# Patient Record
Sex: Male | Born: 1945 | Race: White | Hispanic: No | Marital: Married | Smoking: Former smoker
Health system: Southern US, Community
[De-identification: ages and names within clinical notes are randomized; demographics above are authoritative.]

## PROBLEM LIST (undated history)

## (undated) DIAGNOSIS — K759 Inflammatory liver disease, unspecified: Secondary | ICD-10-CM

## (undated) DIAGNOSIS — O2230 Deep phlebothrombosis in pregnancy, unspecified trimester: Secondary | ICD-10-CM

## (undated) DIAGNOSIS — M4639 Infection of intervertebral disc (pyogenic), multiple sites in spine: Secondary | ICD-10-CM

## (undated) DIAGNOSIS — I10 Essential (primary) hypertension: Secondary | ICD-10-CM

## (undated) DIAGNOSIS — R0600 Dyspnea, unspecified: Secondary | ICD-10-CM

## (undated) DIAGNOSIS — K219 Gastro-esophageal reflux disease without esophagitis: Secondary | ICD-10-CM

## (undated) DIAGNOSIS — I509 Heart failure, unspecified: Secondary | ICD-10-CM

## (undated) DIAGNOSIS — I499 Cardiac arrhythmia, unspecified: Secondary | ICD-10-CM

## (undated) DIAGNOSIS — K859 Acute pancreatitis without necrosis or infection, unspecified: Secondary | ICD-10-CM

## (undated) DIAGNOSIS — K861 Other chronic pancreatitis: Secondary | ICD-10-CM

## (undated) DIAGNOSIS — R3989 Other symptoms and signs involving the genitourinary system: Secondary | ICD-10-CM

## (undated) DIAGNOSIS — J449 Chronic obstructive pulmonary disease, unspecified: Secondary | ICD-10-CM

## (undated) HISTORY — PX: REPLACEMENT TOTAL KNEE: SUR1224

## (undated) HISTORY — PX: ESOPHAGOGASTRODUODENOSCOPY: SHX1529

## (undated) HISTORY — DX: Essential (primary) hypertension: I10

## (undated) HISTORY — DX: Cardiac arrhythmia, unspecified: I49.9

---

## 2010-10-22 ENCOUNTER — Ambulatory Visit: Payer: Self-pay | Admitting: Oncology

## 2010-11-09 ENCOUNTER — Inpatient Hospital Stay: Payer: Self-pay | Admitting: *Deleted

## 2010-11-11 LAB — CANCER ANTIGEN 19-9: CA 19-9: 16 U/mL (ref 0–35)

## 2010-11-11 LAB — AFP TUMOR MARKER: AFP-Tumor Marker: 1.7 ng/mL (ref 0.0–8.3)

## 2010-11-11 LAB — CEA: CEA: 1.2 ng/mL (ref 0.0–4.7)

## 2010-11-20 ENCOUNTER — Ambulatory Visit: Payer: Self-pay | Admitting: Oncology

## 2010-11-25 ENCOUNTER — Ambulatory Visit: Payer: Self-pay | Admitting: Internal Medicine

## 2010-12-02 ENCOUNTER — Ambulatory Visit: Payer: Self-pay | Admitting: Internal Medicine

## 2010-12-02 ENCOUNTER — Inpatient Hospital Stay: Payer: Self-pay | Admitting: Specialist

## 2011-07-16 IMAGING — CT CT GUIDED ABCESS DRAINAGE WITH CATHETER
2 of 3 series · 15 of 32 positions shown, 21 images · non-contrast
Comparison: none

REASON FOR EXAM: Discussed with Dr. More.  For CT guided drainage of
residual left pleural flui
COMMENTS:

PROCEDURE:     CT  - CT GUIDED ABSCESS DRAINAGE  - December 09, 2010 [DATE]
RESULT:
HISTORY: Empyema.

[Series 2: soft tissue · axial · 0.82mm/px · z∈[+295,+565]mm · 11 of 66 slices shown, 17 images]
[im 6/66  soft-tissue]
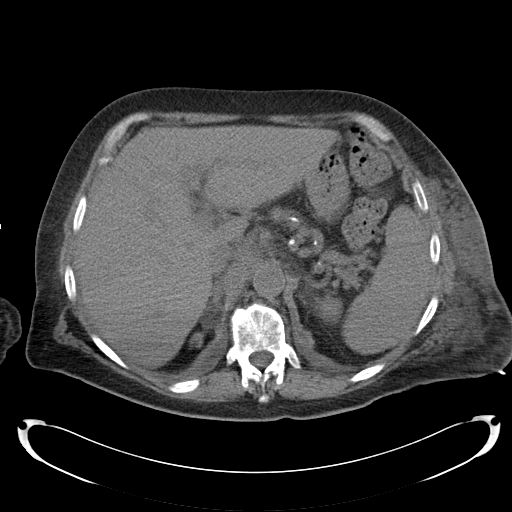
[im 6/66  bone]
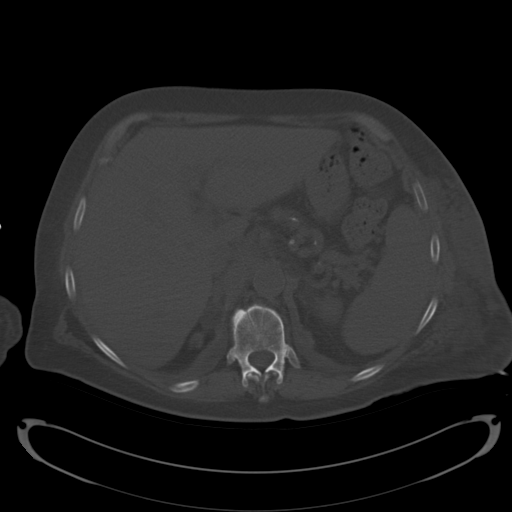
[im 11/66  soft-tissue]
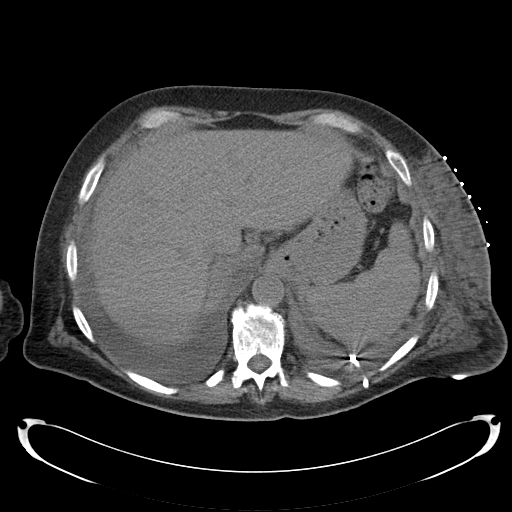
[im 17/66  soft-tissue]
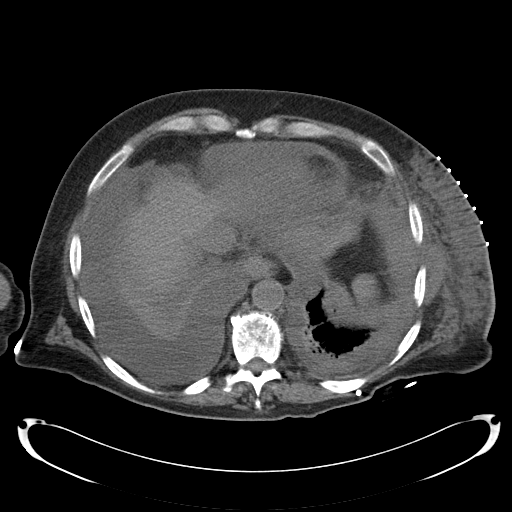
[im 22/66  soft-tissue]
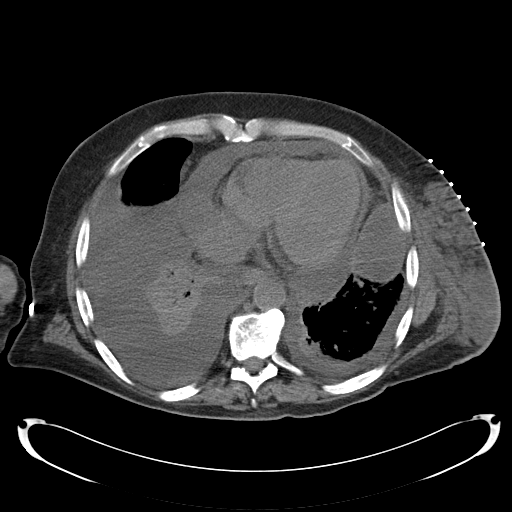
[im 28/66  soft-tissue]
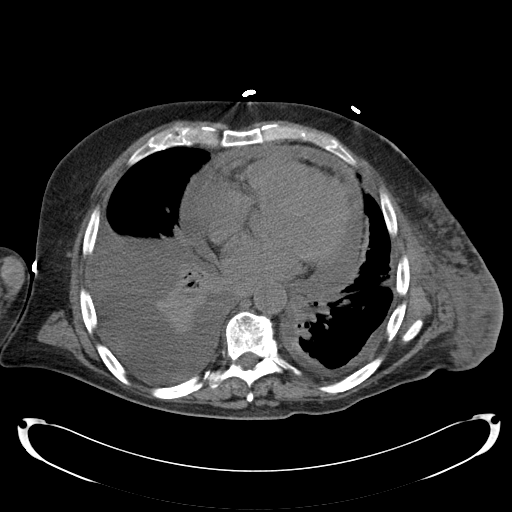
[im 33/66  soft-tissue]
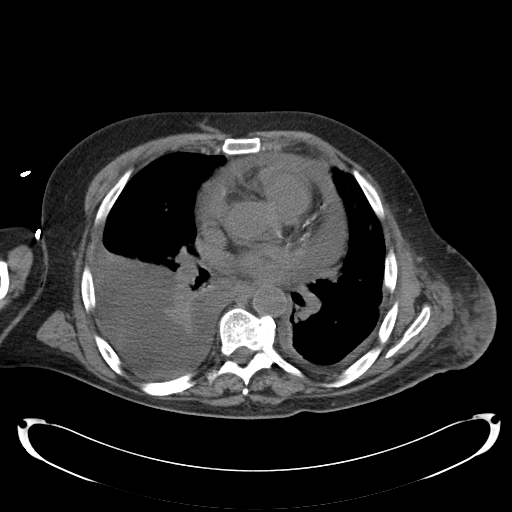
[im 38/66  soft-tissue]
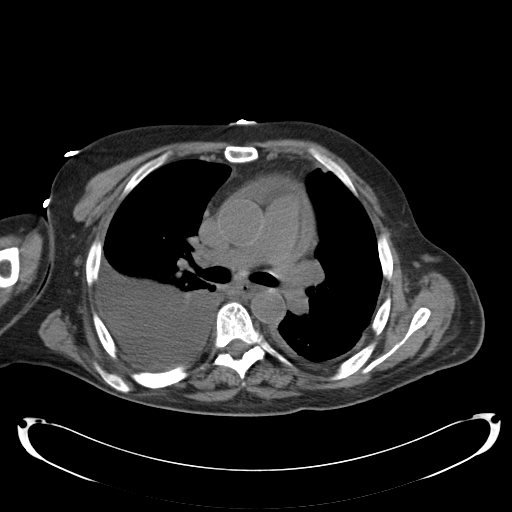
[im 44/66  soft-tissue]
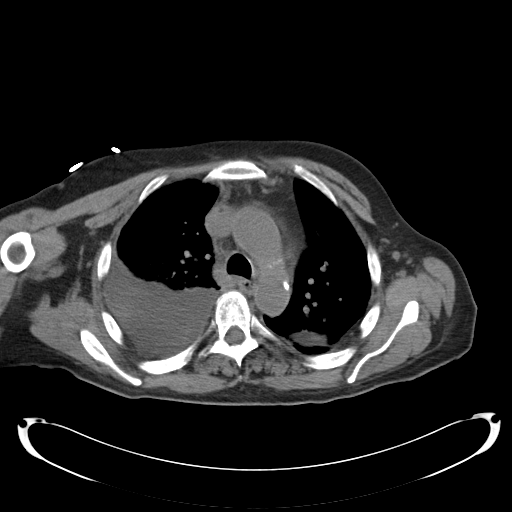
[im 44/66  lung]
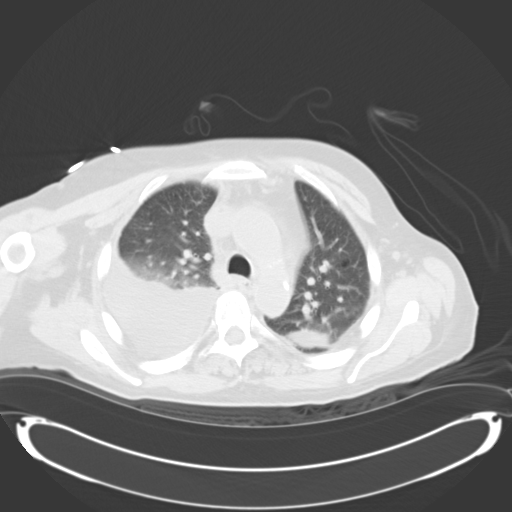
[im 49/66  soft-tissue]
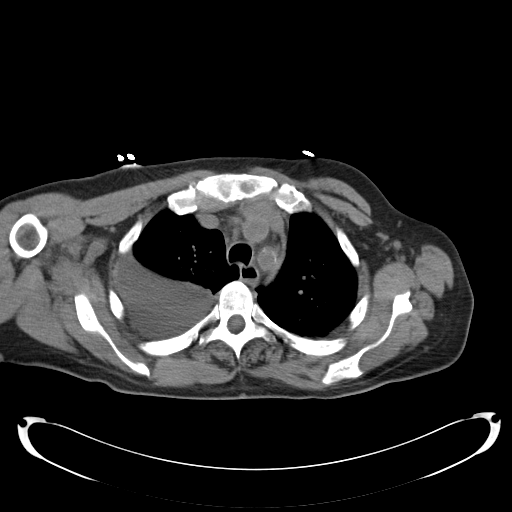
[im 49/66  lung]
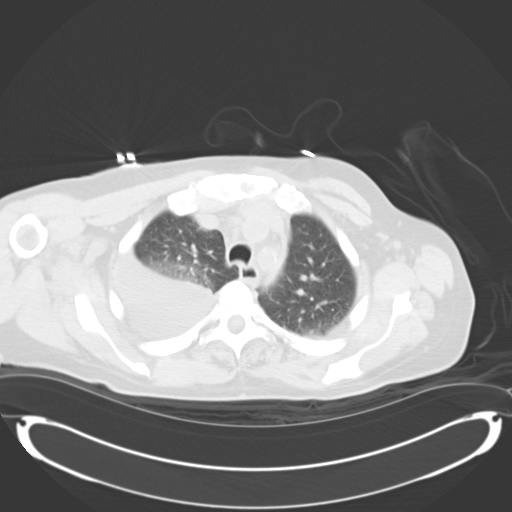
[im 49/66  bone]
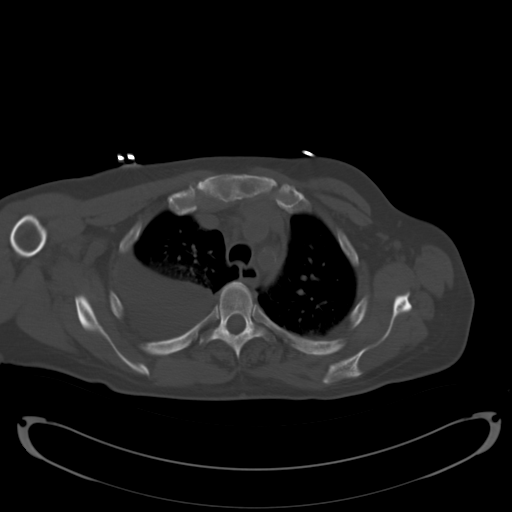
[im 55/66  soft-tissue]
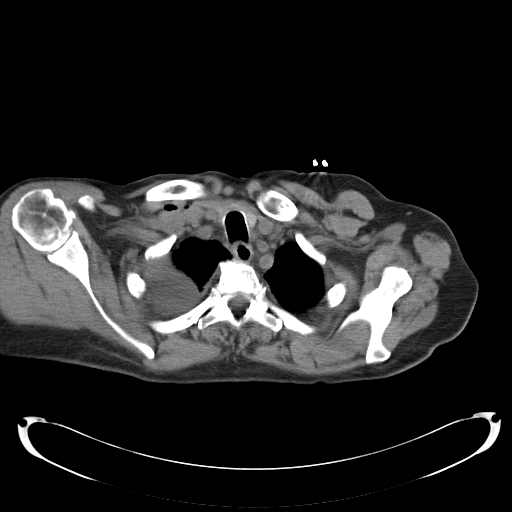
[im 55/66  lung]
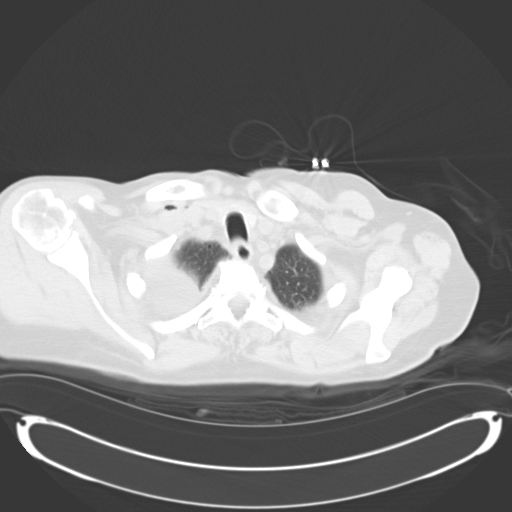
[im 60/66  soft-tissue]
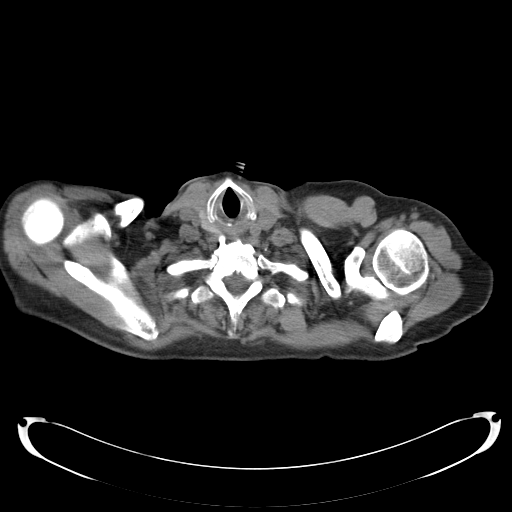
[im 60/66  lung]
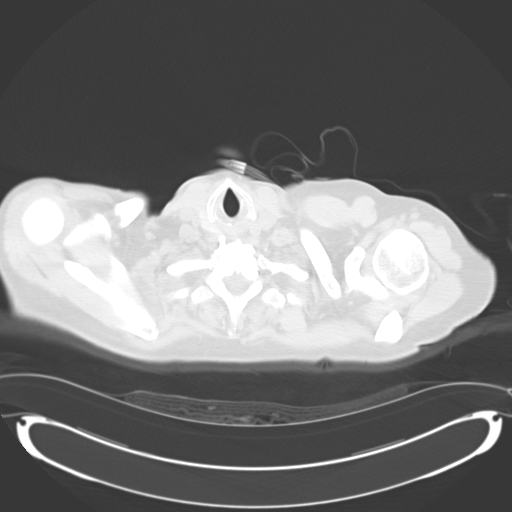

[Series 11: (hospital) 4.8 b30s · axial · 1.48mm/px · z∈[-714,-708]mm · 4 of 33 slices shown]
[im 7/33  soft-tissue]
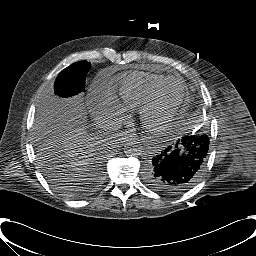
[im 13/33  soft-tissue]
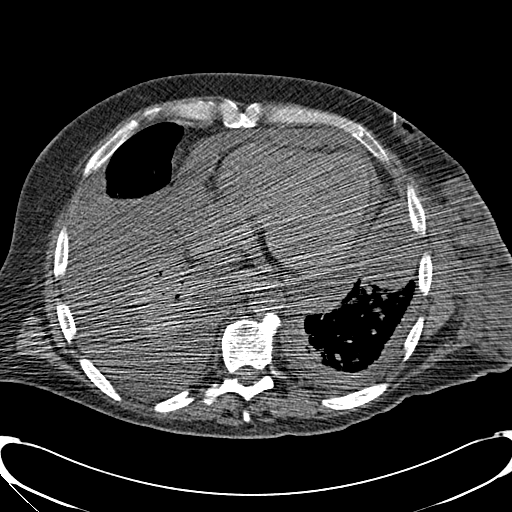
[im 20/33  soft-tissue]
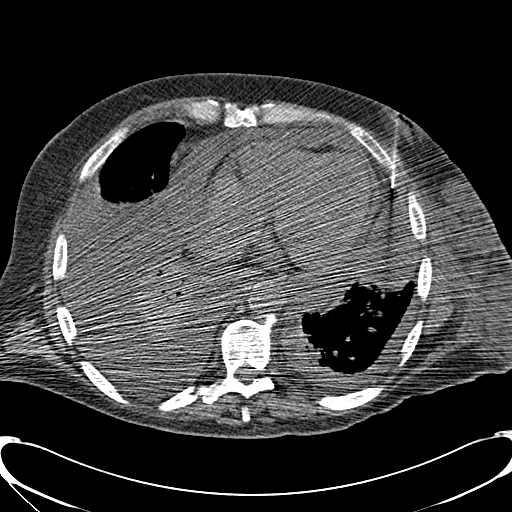
[im 26/33  soft-tissue]
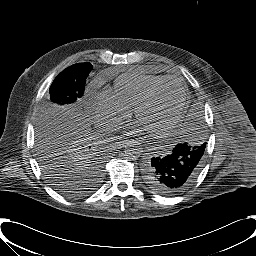

[15 of 32 positions shown; findings below may reference images not displayed]

PROCEDURE AND FINDINGS:  After discussing the risks and benefits of the
procedure, patient informed consent was obtained. Residual left anterior
pleural fluid collection was accessed following sterile preparation of the
patient and following local anesthesia with 1% Lidocaine. IV conscious
sedation was performed. A 22 gauge needle was advanced into the anterior
pleural fluid collection. A 2.276wire was placed and this was followed by
placement of a 6.5 French guidewire introduction set. This was followed by
placement of an Amplatz extra-stiff wire. This was followed by placement of
an 8 French chest tube which was hooked to drainage. There were no
complications.
IMPRESSION: Successful left chest tube placement and anterior pleural
fluid collection drainage. The pleural fluid was completely removed. The
fluid was purulent. Samples were sent to Microbiology for further
evaluation.

## 2011-07-17 IMAGING — CR DG CHEST 1V PORT
1 series · 1 of 1 positions shown · non-contrast
Comparison: none

REASON FOR EXAM: status post right chest tube, pericardial drain
COMMENTS:

[view not recorded]
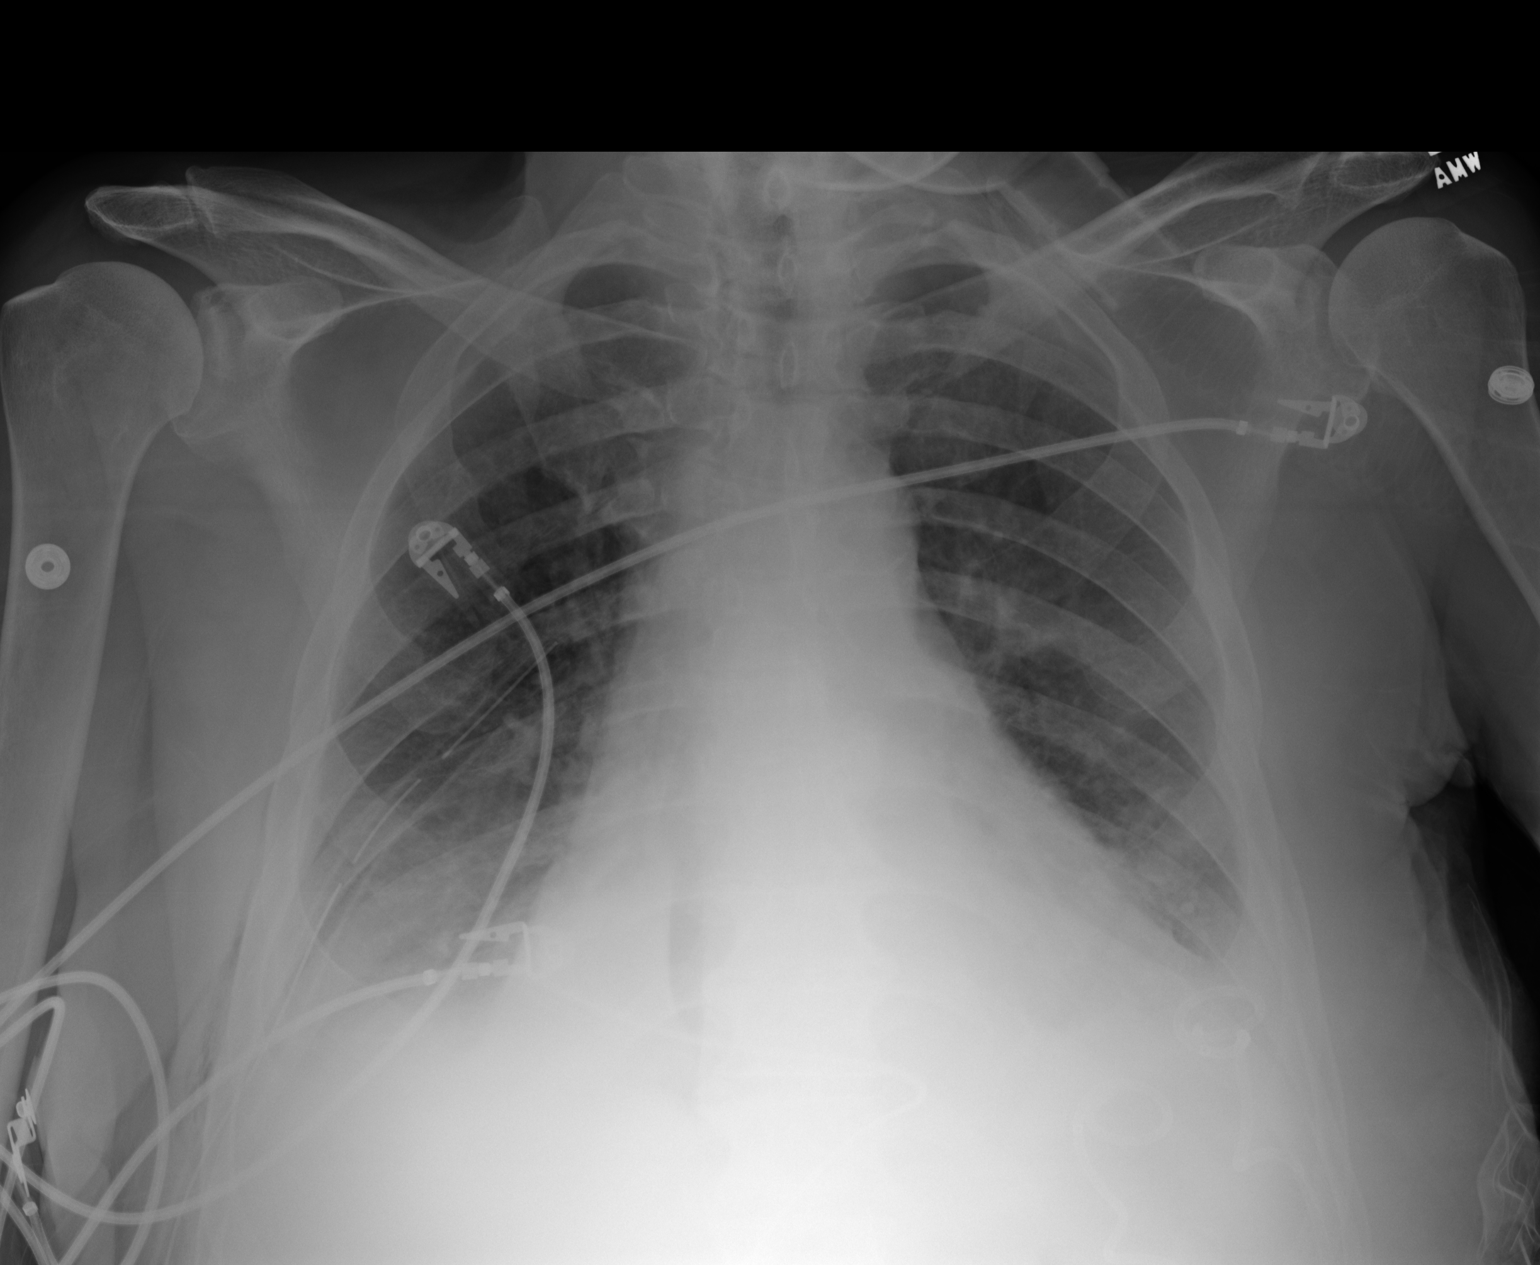

[1 of 1 positions shown; findings below may reference images not displayed]

PROCEDURE:     DXR - DXR PORTABLE CHEST SINGLE VIEW  - December 10, 2010  [DATE]

RESULT:     A right-sided chest tube is appreciated with the tip projecting
in the mid to upper right hemithorax. There is no radiographic evidence of
an appreciable pneumothorax. Areas of increased density project within the
lung bases. There is blunting of the costophrenic angles. The cardiac
silhouette is within the upper limits of normal. The visualized bony
skeleton is unremarkable.
IMPRESSION: 1. Right-sided chest tube as described above.
2. Atelectasis versus infiltrate within the lung bases as well as possibly a
small bilateral effusion. Continued surveillance evaluation recommended.

## 2011-07-18 IMAGING — CR DG CHEST 1V PORT
1 series · 1 of 1 positions shown · non-contrast
Comparison: none

REASON FOR EXAM: CHEST TUBE PLACEMENT
COMMENTS:

[view not recorded]
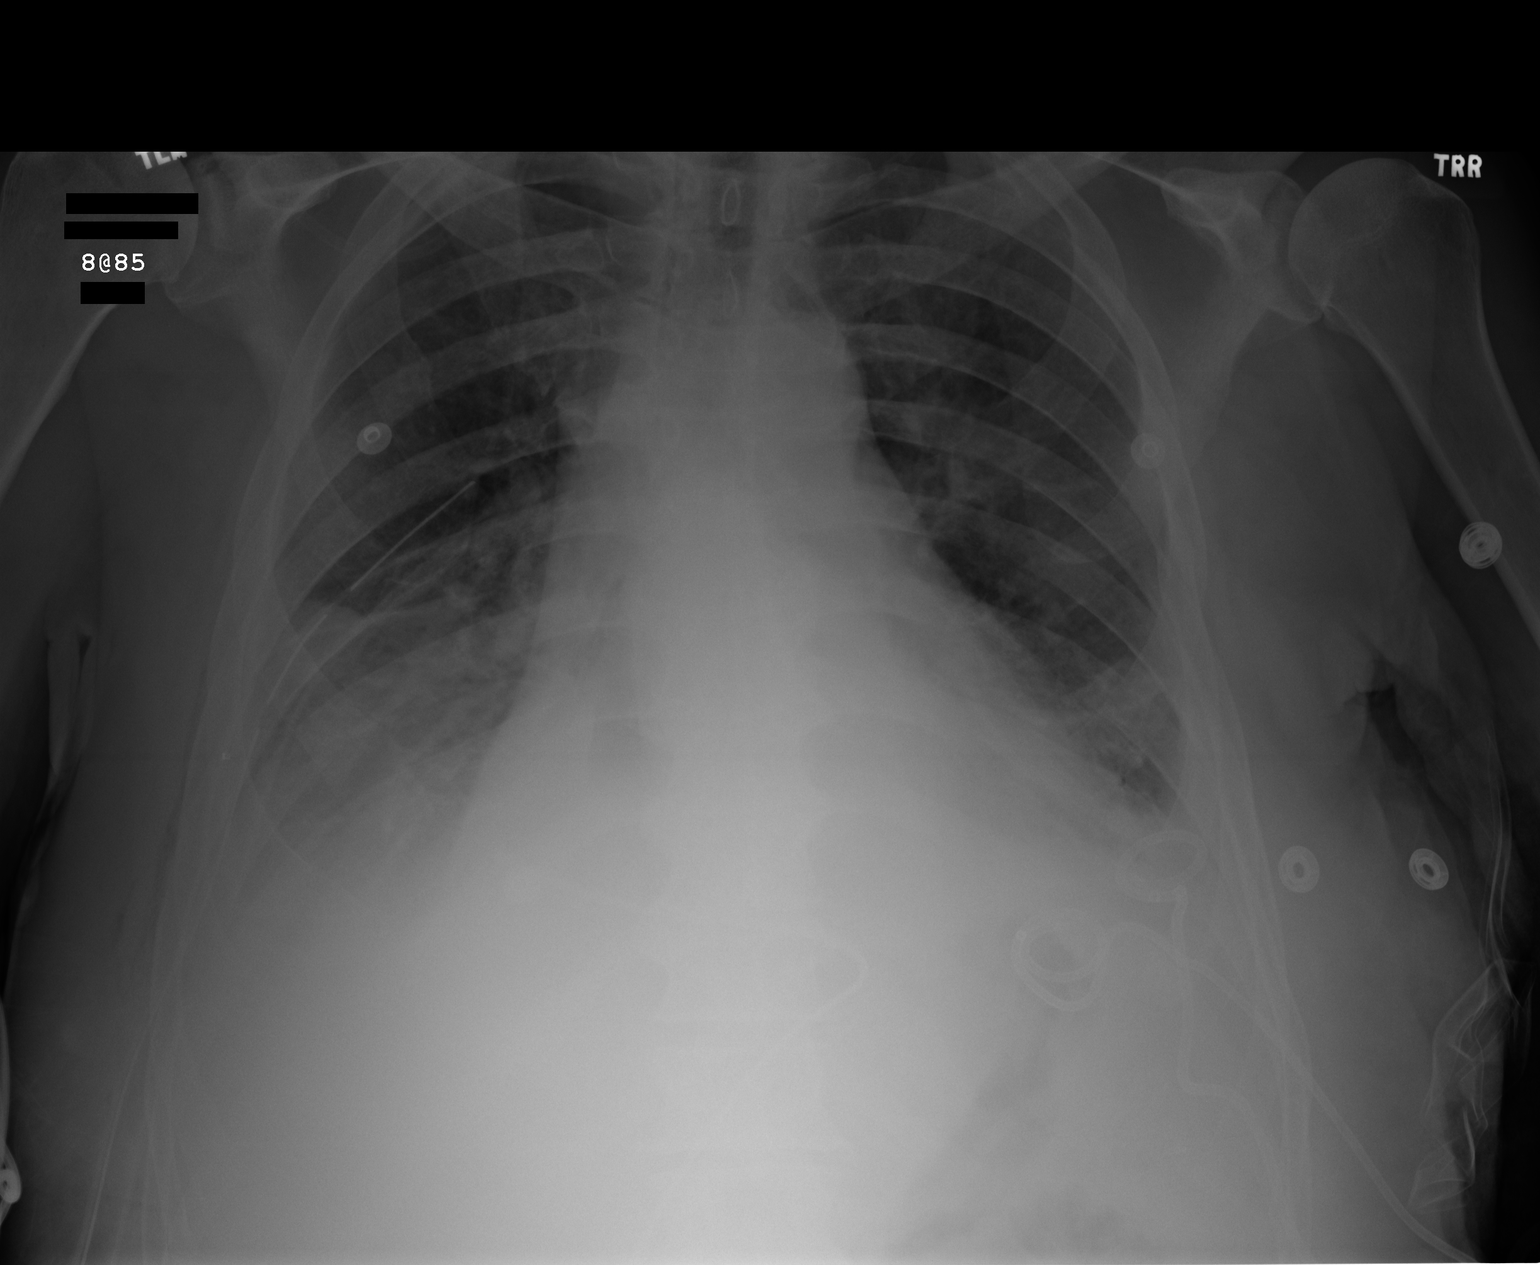

[1 of 1 positions shown; findings below may reference images not displayed]

PROCEDURE:     DXR - DXR PORTABLE CHEST SINGLE VIEW  - December 11, 2010  [DATE]

RESULT:     Comparison is made to the prior exam of 12/10/2010. A right chest
tube is present. No pneumothorax is seen. There is again observed increased
density in both lung bases, more prominent on the right. The heart is
enlarged but stable in size as compared to the prior exam. No pulmonary
edema is seen.
IMPRESSION: 1. A right chest tube is present.
2. No pneumothorax is seen.
3. There persists increased density at both lung bases.
4. Cardiomegaly.

## 2011-07-19 IMAGING — CR DG CHEST 1V PORT
1 series · 1 of 1 positions shown · non-contrast
Comparison: none

REASON FOR EXAM: Pleural effusion
COMMENTS:

[view not recorded]
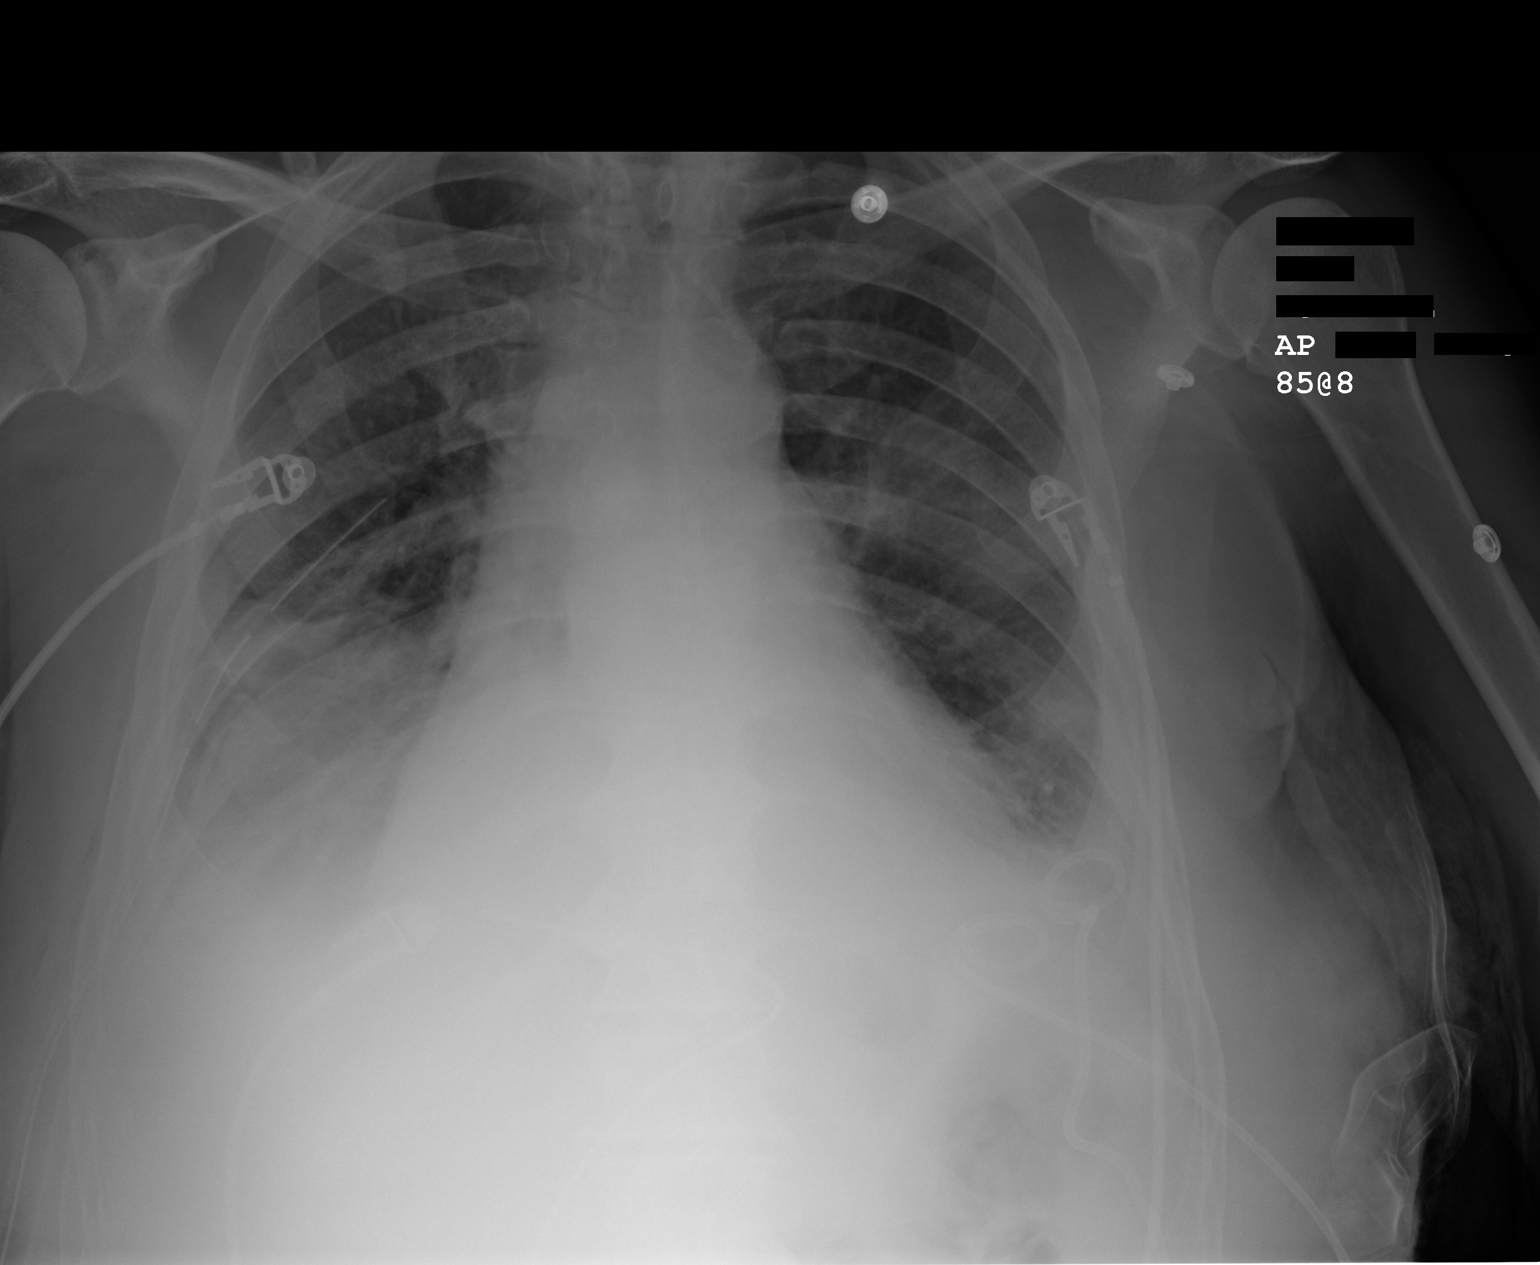

[1 of 1 positions shown; findings below may reference images not displayed]

PROCEDURE:     DXR - DXR PORTABLE CHEST SINGLE VIEW  - December 12, 2010  [DATE]

RESULT:     Comparison is made to the prior exam of yesterday. A right chest
tube remains present. The previously noted density at the right base
persists and is unchanged. The findings are consistent with pleural effusion
with possible associated atelectasis or infiltrate. No pneumothorax on the
right is seen.

There is again observed increased density at the left base which obscures
the left hemidiaphragm. This also is not changed appreciably since the prior
exam. There are again noted to drainage catheters present with the distal
portions projected at the left lung base and consistent with drainage
catheters for the air fluid collection demonstrated at the left base on
prior CT. The left upper lobe remains clear. The heart is enlarged but no
progressive enlargement is seen as compared to the prior exam.
IMPRESSION: Stable appearing chest as compared to the exam of 12/11/2010.

## 2011-07-22 IMAGING — CR DG CHEST 2V
1 series · 2 of 2 positions shown · non-contrast
Comparison: none

REASON FOR EXAM: removal of pericardial and right pleural drain
COMMENTS:

PROCEDURE:     DXR - DXR CHEST PA (OR AP) AND LATERAL  - December 15, 2010  [DATE]
RESULT:     Comparison: 12/15/2010

[Series 1: view not recorded · 0.17mm/px · 2 of 2 slices shown]
[im 1/2]
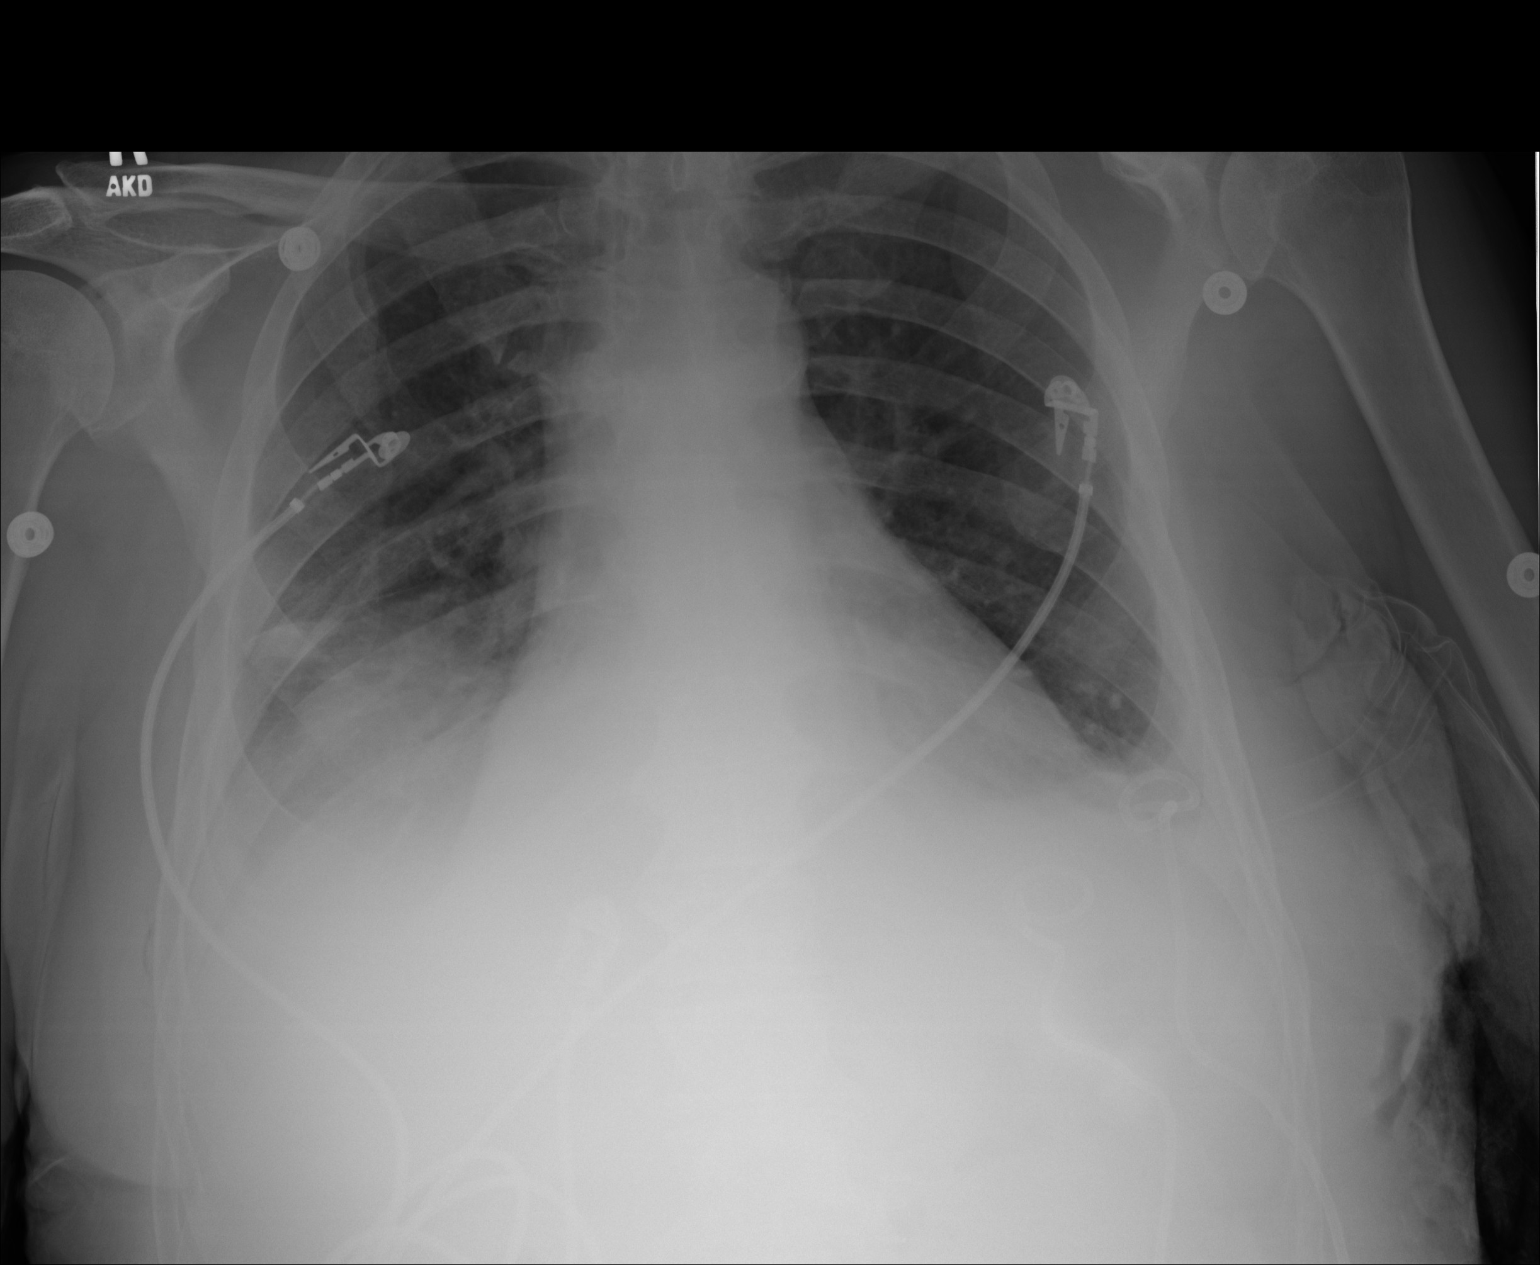
[im 2/2]
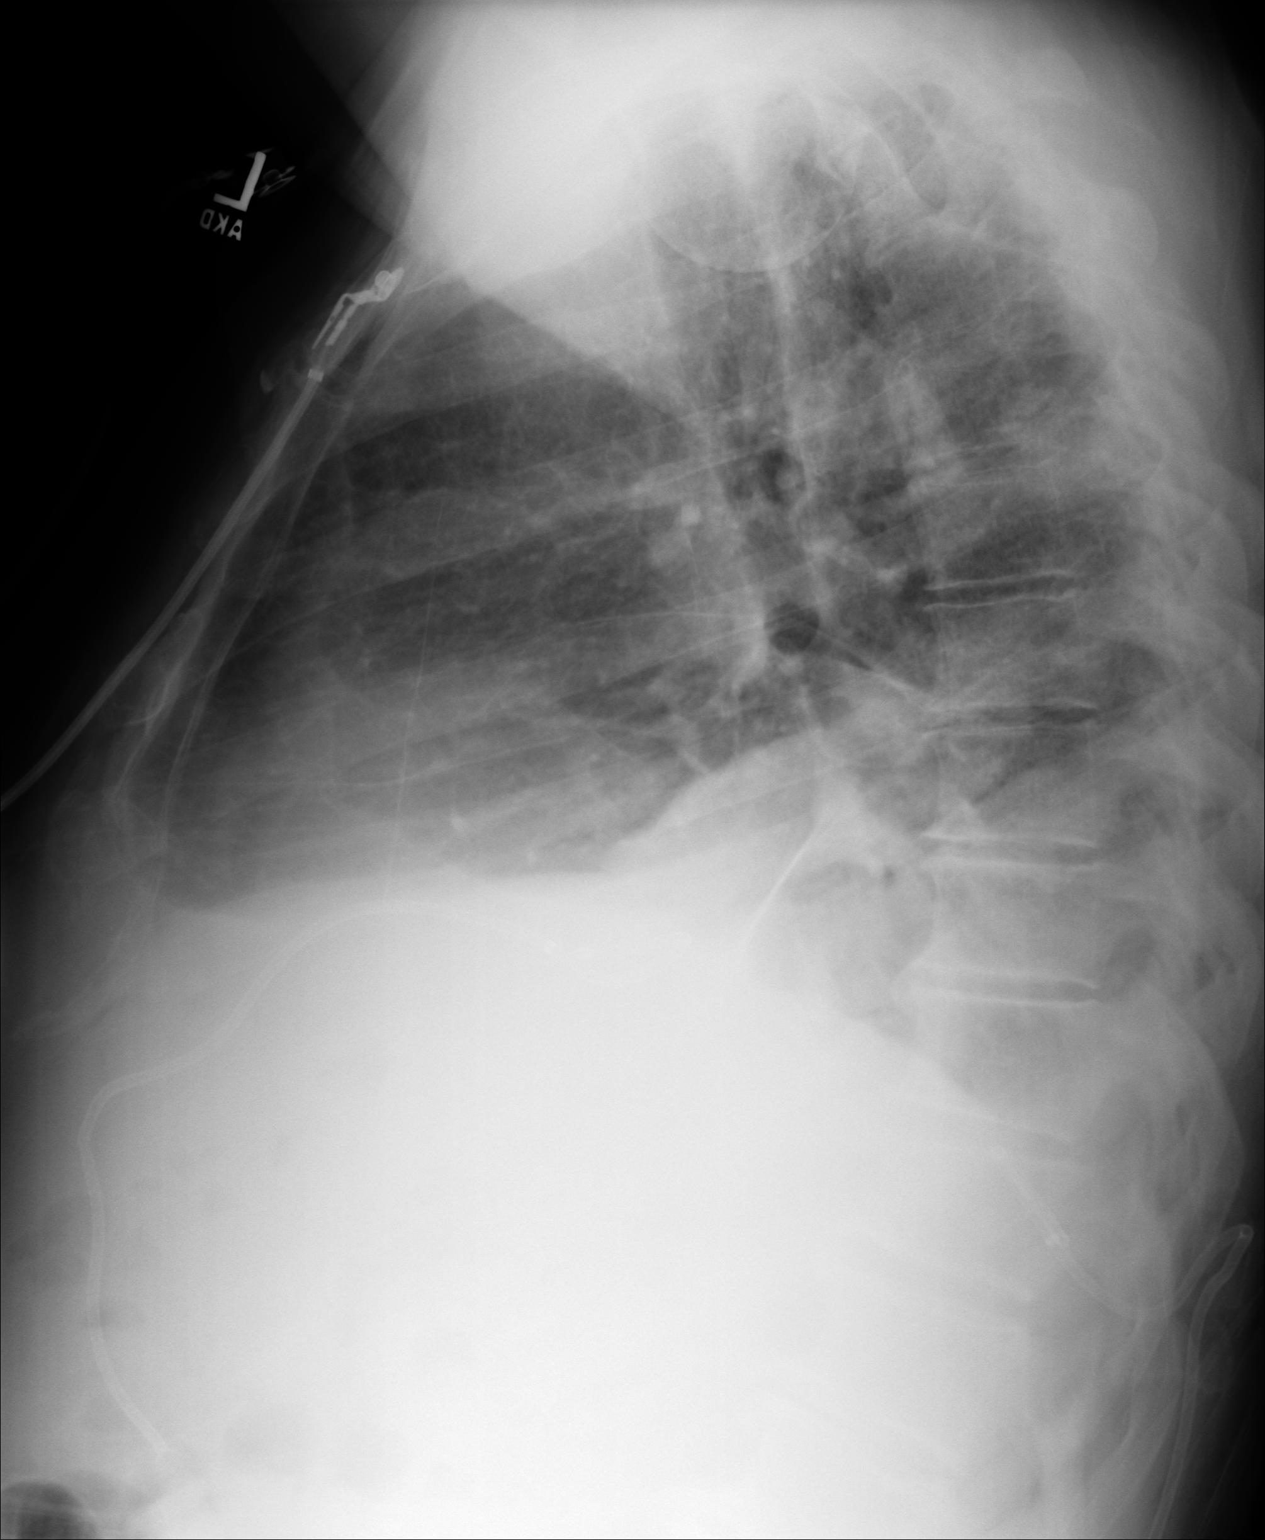

[2 of 2 positions shown; findings below may reference images not displayed]

FINDINGS: The mediastinum and right chest tube have been removed. No pneumothorax
seen. There are mild right basilar opacities which may represent a
combination of atelectasis and minimal residual pleural fluid. Two pigtail
catheters overlie the left lower hemithorax. Mild left basilar opacities may
represent a combination of atelectasis and residual pleural fluid.
IMPRESSION: 1. Interval removal of right chest tube and mediastinal drain. No
pneumothorax seen.
2. Unchanged mild bibasilar opacities which may represent a combination of
atelectasis and pleural fluid.

## 2013-07-13 ENCOUNTER — Ambulatory Visit: Payer: Self-pay | Admitting: Gastroenterology

## 2013-08-03 ENCOUNTER — Ambulatory Visit: Payer: Self-pay | Admitting: Gastroenterology

## 2013-08-11 ENCOUNTER — Ambulatory Visit: Payer: Self-pay | Admitting: Oncology

## 2013-08-11 LAB — COMPREHENSIVE METABOLIC PANEL
Albumin: 3.4 g/dL (ref 3.4–5.0)
Alkaline Phosphatase: 117 U/L (ref 50–136)
Anion Gap: 6 — ABNORMAL LOW (ref 7–16)
BUN: 10 mg/dL (ref 7–18)
Bilirubin,Total: 0.4 mg/dL (ref 0.2–1.0)
Calcium, Total: 8.9 mg/dL (ref 8.5–10.1)
Chloride: 102 mmol/L (ref 98–107)
Co2: 31 mmol/L (ref 21–32)
EGFR (African American): >60
EGFR (Non-African Amer.): >60
Potassium: 4.3 mmol/L (ref 3.5–5.1)
SGOT(AST): 23 U/L (ref 15–37)

## 2013-08-11 LAB — CBC CANCER CENTER
Basophil %: 1 %
Eosinophil #: 0.3 x10 3/mm (ref 0.0–0.7)
HGB: 14.5 g/dL (ref 13.0–18.0)
Lymphocyte #: 1.9 x10 3/mm (ref 1.0–3.6)
MCH: 28.8 pg (ref 26.0–34.0)
MCHC: 32.5 g/dL (ref 32.0–36.0)
MCV: 89 fL (ref 80–100)
Neutrophil %: 64.1 %
Platelet: 267 x10 3/mm (ref 150–440)

## 2013-08-11 LAB — PROTIME-INR
INR: 1
Prothrombin Time: 13.2 secs (ref 11.5–14.7)

## 2013-08-11 LAB — APTT: Activated PTT: 32.2 secs (ref 23.6–35.9)

## 2013-08-21 ENCOUNTER — Ambulatory Visit: Payer: Self-pay | Admitting: Oncology

## 2013-08-21 DIAGNOSIS — K219 Gastro-esophageal reflux disease without esophagitis: Secondary | ICD-10-CM | POA: Insufficient documentation

## 2013-08-21 DIAGNOSIS — B1920 Unspecified viral hepatitis C without hepatic coma: Secondary | ICD-10-CM | POA: Insufficient documentation

## 2013-08-31 LAB — COMPREHENSIVE METABOLIC PANEL
Albumin: 3.6 g/dL (ref 3.4–5.0)
BUN: 13 mg/dL (ref 7–18)
Chloride: 101 mmol/L (ref 98–107)
Creatinine: 0.82 mg/dL (ref 0.60–1.30)
Glucose: 121 mg/dL — ABNORMAL HIGH (ref 65–99)
Osmolality: 275 (ref 275–301)
Potassium: 3.7 mmol/L (ref 3.5–5.1)
Sodium: 137 mmol/L (ref 136–145)
Total Protein: 8.2 g/dL (ref 6.4–8.2)

## 2013-08-31 LAB — CBC CANCER CENTER
Basophil #: 0.1 x10 3/mm (ref 0.0–0.1)
HGB: 15.7 g/dL (ref 13.0–18.0)
Lymphocyte #: 1.2 x10 3/mm (ref 1.0–3.6)
Lymphocyte %: 12.6 %
MCH: 28.6 pg (ref 26.0–34.0)
Monocyte #: 0.5 x10 3/mm (ref 0.2–1.0)
Monocyte %: 5.4 %
Neutrophil %: 80.8 %
Platelet: 226 x10 3/mm (ref 150–440)
RBC: 5.5 10*6/uL (ref 4.40–5.90)
WBC: 9.7 x10 3/mm (ref 3.8–10.6)

## 2013-08-31 LAB — LIPASE, BLOOD: Lipase: 2472 U/L — ABNORMAL HIGH (ref 73–393)

## 2013-09-21 ENCOUNTER — Ambulatory Visit: Payer: Self-pay | Admitting: Oncology

## 2013-11-21 LAB — PATHOLOGY REPORT

## 2015-05-16 ENCOUNTER — Other Ambulatory Visit: Payer: Self-pay | Admitting: Family Medicine

## 2015-05-17 ENCOUNTER — Other Ambulatory Visit: Payer: Self-pay | Admitting: Family Medicine

## 2015-09-18 ENCOUNTER — Emergency Department: Payer: Medicare Other

## 2015-09-18 ENCOUNTER — Encounter: Payer: Self-pay | Admitting: Medical Oncology

## 2015-09-18 ENCOUNTER — Inpatient Hospital Stay
Admission: EM | Admit: 2015-09-18 | Discharge: 2015-09-20 | DRG: 603 | Disposition: A | Discharge status: Home Health | Payer: Medicare Other | Attending: Internal Medicine | Admitting: Internal Medicine

## 2015-09-18 ENCOUNTER — Other Ambulatory Visit: Payer: Self-pay | Admitting: Family Medicine

## 2015-09-18 ENCOUNTER — Observation Stay: Payer: Medicare Other

## 2015-09-18 DIAGNOSIS — I248 Other forms of acute ischemic heart disease: Secondary | ICD-10-CM | POA: Diagnosis present

## 2015-09-18 DIAGNOSIS — M549 Dorsalgia, unspecified: Secondary | ICD-10-CM | POA: Diagnosis not present

## 2015-09-18 DIAGNOSIS — Z23 Encounter for immunization: Secondary | ICD-10-CM | POA: Insufficient documentation

## 2015-09-18 DIAGNOSIS — L0390 Cellulitis, unspecified: Secondary | ICD-10-CM | POA: Diagnosis present

## 2015-09-18 DIAGNOSIS — Z95828 Presence of other vascular implants and grafts: Secondary | ICD-10-CM

## 2015-09-18 DIAGNOSIS — K861 Other chronic pancreatitis: Secondary | ICD-10-CM | POA: Diagnosis present

## 2015-09-18 DIAGNOSIS — Z880 Allergy status to penicillin: Secondary | ICD-10-CM

## 2015-09-18 DIAGNOSIS — D3500 Benign neoplasm of unspecified adrenal gland: Secondary | ICD-10-CM | POA: Diagnosis present

## 2015-09-18 DIAGNOSIS — L03116 Cellulitis of left lower limb: Secondary | ICD-10-CM | POA: Diagnosis present

## 2015-09-18 DIAGNOSIS — Z96659 Presence of unspecified artificial knee joint: Secondary | ICD-10-CM | POA: Diagnosis present

## 2015-09-18 DIAGNOSIS — Z87891 Personal history of nicotine dependence: Secondary | ICD-10-CM

## 2015-09-18 DIAGNOSIS — E871 Hypo-osmolality and hyponatremia: Secondary | ICD-10-CM | POA: Diagnosis present

## 2015-09-18 DIAGNOSIS — Z79899 Other long term (current) drug therapy: Secondary | ICD-10-CM

## 2015-09-18 DIAGNOSIS — I509 Heart failure, unspecified: Secondary | ICD-10-CM | POA: Diagnosis present

## 2015-09-18 DIAGNOSIS — R7301 Impaired fasting glucose: Secondary | ICD-10-CM | POA: Diagnosis present

## 2015-09-18 DIAGNOSIS — L03312 Cellulitis of back [any part except buttock]: Principal | ICD-10-CM | POA: Diagnosis present

## 2015-09-18 DIAGNOSIS — R079 Chest pain, unspecified: Secondary | ICD-10-CM

## 2015-09-18 DIAGNOSIS — K219 Gastro-esophageal reflux disease without esophagitis: Secondary | ICD-10-CM | POA: Diagnosis present

## 2015-09-18 HISTORY — DX: Heart failure, unspecified: I50.9

## 2015-09-18 HISTORY — DX: Gastro-esophageal reflux disease without esophagitis: K21.9

## 2015-09-18 HISTORY — DX: Acute pancreatitis without necrosis or infection, unspecified: K85.90

## 2015-09-18 LAB — HEPATIC FUNCTION PANEL
ALT: 36 U/L (ref 17–63)
AST: 27 U/L (ref 15–41)
Albumin: 3 g/dL — ABNORMAL LOW (ref 3.5–5.0)
Alkaline Phosphatase: 278 U/L — ABNORMAL HIGH (ref 38–126)
Bilirubin, Direct: 2 mg/dL — ABNORMAL HIGH (ref 0.1–0.5)
Indirect Bilirubin: 1.9 mg/dL — ABNORMAL HIGH (ref 0.3–0.9)
Total Bilirubin: 3.9 mg/dL — ABNORMAL HIGH (ref 0.3–1.2)
Total Protein: 7.2 g/dL (ref 6.5–8.1)

## 2015-09-18 LAB — BASIC METABOLIC PANEL
ANION GAP: 8 (ref 5–15)
BUN: 16 mg/dL (ref 6–20)
CHLORIDE: 104 mmol/L (ref 101–111)
CO2: 20 mmol/L — AB (ref 22–32)
Calcium: 9 mg/dL (ref 8.9–10.3)
Creatinine, Ser: 0.93 mg/dL (ref 0.61–1.24)
GFR calc non Af Amer: >60 mL/min (ref >60)
GLUCOSE: 137 mg/dL — AB (ref 65–99)
POTASSIUM: 3.9 mmol/L (ref 3.5–5.1)
Sodium: 132 mmol/L — ABNORMAL LOW (ref 135–145)

## 2015-09-18 LAB — TROPONIN I
TROPONIN I: 0.06 ng/mL — AB (ref <0.031)
Troponin I: 0.1 ng/mL — ABNORMAL HIGH
Troponin I: 0.07 ng/mL — ABNORMAL HIGH (ref <0.031)

## 2015-09-18 LAB — CBC
HCT: 40.9 % (ref 40.0–52.0)
Hemoglobin: 13.3 g/dL (ref 13.0–18.0)
MCH: 27.3 pg (ref 26.0–34.0)
MCHC: 32.4 g/dL (ref 32.0–36.0)
MCV: 84.4 fL (ref 80.0–100.0)
Platelets: 206 K/uL (ref 150–440)
RBC: 4.85 MIL/uL (ref 4.40–5.90)
RDW: 14.1 % (ref 11.5–14.5)
WBC: 21 K/uL — ABNORMAL HIGH (ref 3.8–10.6)

## 2015-09-18 LAB — SEDIMENTATION RATE: Sed Rate: 39 mm/hr — ABNORMAL HIGH (ref 0–20)

## 2015-09-18 LAB — LIPASE, BLOOD: LIPASE: 15 U/L (ref 11–51)

## 2015-09-18 MED ORDER — ACETAMINOPHEN 650 MG RE SUPP: 650.0000 mg | Freq: Four times a day (QID) | RECTAL | Status: DC | PRN

## 2015-09-18 MED ORDER — VANCOMYCIN HCL 10 G IV SOLR
1250.0000 mg | INTRAVENOUS | Status: DC
  Administered 2015-09-19 (×2): 1250 mg via INTRAVENOUS
  Filled 2015-09-18 (×3): qty 1250

## 2015-09-18 MED ORDER — MORPHINE SULFATE (PF) 4 MG/ML IV SOLN
4.0000 mg | Freq: Once | INTRAVENOUS | Status: AC
  Administered 2015-09-18: 4 mg via INTRAVENOUS
  Filled 2015-09-18: qty 1

## 2015-09-18 MED ORDER — ACETAMINOPHEN 325 MG PO TABS
650.0000 mg | ORAL_TABLET | Freq: Four times a day (QID) | ORAL | Status: DC | PRN
  Filled 2015-09-18: qty 2

## 2015-09-18 MED ORDER — ONDANSETRON HCL 4 MG/2ML IJ SOLN
4.0000 mg | Freq: Once | INTRAMUSCULAR | Status: AC
  Administered 2015-09-18: 4 mg via INTRAVENOUS
  Filled 2015-09-18: qty 2

## 2015-09-18 MED ORDER — INFLUENZA VAC SPLIT QUAD 0.5 ML IM SUSY
0.5000 mL | PREFILLED_SYRINGE | INTRAMUSCULAR | Status: AC
  Administered 2015-09-20: 0.5 mL via INTRAMUSCULAR
  Filled 2015-09-18: qty 0.5

## 2015-09-18 MED ORDER — SODIUM CHLORIDE 0.9 % IJ SOLN: 3.0000 mL | INTRAMUSCULAR | Status: DC | PRN

## 2015-09-18 MED ORDER — DEXTROSE 5 % IV SOLN
2.0000 g | INTRAVENOUS | Status: DC
  Administered 2015-09-18: 2 g via INTRAVENOUS
  Filled 2015-09-18 (×2): qty 2

## 2015-09-18 MED ORDER — DEXAMETHASONE SODIUM PHOSPHATE 10 MG/ML IJ SOLN
10.0000 mg | Freq: Two times a day (BID) | INTRAMUSCULAR | Status: DC
  Administered 2015-09-18 – 2015-09-19 (×3): 10 mg via INTRAVENOUS
  Filled 2015-09-18 (×3): qty 1

## 2015-09-18 MED ORDER — DEXTROSE 5 % IV SOLN
2.0000 g | Freq: Two times a day (BID) | INTRAVENOUS | Status: DC
  Administered 2015-09-18 – 2015-09-20 (×5): 2 g via INTRAVENOUS
  Filled 2015-09-18 (×7): qty 2

## 2015-09-18 MED ORDER — IOHEXOL 350 MG/ML SOLN
100.0000 mL | Freq: Once | INTRAVENOUS | Status: AC | PRN
  Administered 2015-09-18: 100 mL via INTRAVENOUS

## 2015-09-18 MED ORDER — SODIUM CHLORIDE 0.9 % IV SOLN
INTRAVENOUS | Status: DC
  Administered 2015-09-18 – 2015-09-19 (×2): via INTRAVENOUS

## 2015-09-18 MED ORDER — MORPHINE SULFATE (PF) 2 MG/ML IV SOLN
2.0000 mg | INTRAVENOUS | Status: DC | PRN
  Administered 2015-09-18: 2 mg via INTRAVENOUS
  Filled 2015-09-18: qty 1

## 2015-09-18 MED ORDER — ASPIRIN 81 MG PO CHEW
324.0000 mg | CHEWABLE_TABLET | Freq: Once | ORAL | Status: AC
  Administered 2015-09-18: 324 mg via ORAL
  Filled 2015-09-18: qty 4

## 2015-09-18 MED ORDER — SODIUM CHLORIDE 0.9 % IJ SOLN
3.0000 mL | Freq: Two times a day (BID) | INTRAMUSCULAR | Status: DC
  Administered 2015-09-18: 3 mL via INTRAVENOUS

## 2015-09-18 MED ORDER — VANCOMYCIN HCL 10 G IV SOLR
1250.0000 mg | INTRAVENOUS | Status: AC
  Administered 2015-09-18: 1250 mg via INTRAVENOUS
  Filled 2015-09-18: qty 1250

## 2015-09-18 MED ORDER — OXYCODONE HCL 5 MG PO TABS
5.0000 mg | ORAL_TABLET | ORAL | Status: DC | PRN
  Administered 2015-09-18: 5 mg via ORAL
  Filled 2015-09-18: qty 1

## 2015-09-18 MED ORDER — PANTOPRAZOLE SODIUM 40 MG PO TBEC
40.0000 mg | DELAYED_RELEASE_TABLET | Freq: Every day | ORAL | Status: DC
  Administered 2015-09-19 – 2015-09-20 (×2): 40 mg via ORAL
  Filled 2015-09-18: qty 1

## 2015-09-18 MED ORDER — GADOBENATE DIMEGLUMINE 529 MG/ML IV SOLN
20.0000 mL | Freq: Once | INTRAVENOUS | Status: AC | PRN
  Administered 2015-09-18: 17 mL via INTRAVENOUS

## 2015-09-18 MED ORDER — LORAZEPAM 2 MG/ML IJ SOLN
0.5000 mg | Freq: Once | INTRAMUSCULAR | Status: AC
  Administered 2015-09-18: 0.5 mg via INTRAVENOUS
  Filled 2015-09-18: qty 1

## 2015-09-18 MED ORDER — SODIUM CHLORIDE 0.9 % IV BOLUS (SEPSIS)
1000.0000 mL | Freq: Once | INTRAVENOUS | Status: AC
  Administered 2015-09-18: 1000 mL via INTRAVENOUS

## 2015-09-18 NOTE — ED Notes (Signed)
Patient transported to CT 

## 2015-09-18 NOTE — Progress Notes (Signed)
Pt. admitted to unit, [id number] from ED. Oriented to room, call bell, Ascom phones and staff. Bed in low position. Fall safety plan reviewed, contract signed and placed on wall, brown non-skid socks in place, bed alarm on. Full assessment to Epic; skin assessed with Amy Dalton RN. Telemetry box verified with tele clerk and Alinda Deem, [place]: [phone] . Will continue to monitor.

## 2015-09-18 NOTE — ED Notes (Signed)
Pt to triage with reports of a sharp stabbing pain to the center of his chest that radiates into his back that began yesterday morning. Pt reports sob with movement.

## 2015-09-18 NOTE — ED Provider Notes (Signed)
[facility] Emergency Department Provider Note  Time seen: 9:13 AM  I have reviewed the triage vital signs and the nursing notes.   HISTORY  Chief Complaint Chest Pain    HPI Karl Norman is a 69 y.o. male with a past medical history of pancreatitis, gastric reflux, DVT, presents the emergency department with chest pain. According to the patient beginning yesterday at 5:00 in the morning he experience sharp stabbing central chest pain that radiates to his back. States yesterday the pain was moderate to severe, however he can find certain positions in which she considers laying in and the pain goes away. Today he states the pain continues so he came to the emergency department for evaluation. Patient is also complaining of left ankle pain worse upon standing. Denies any trauma. States he has history of significant arthritis and this feels typical for his arthritis. Denies any trouble breathing, nausea, vomiting, diaphoresis. States mild cough but denies any fever or congestion. Denies any recent leg pain or swelling besides his left ankle pain. 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     Past Medical History  Diagnosis Date  . GERD (gastroesophageal reflux disease)   . Pancreatitis     There are no active problems to display for this patient.   Past Surgical History  Procedure Laterality Date  . Replacement total knee      No current outpatient prescriptions on file.  Allergies Penicillins  No family history on file.  Social History Social History  Substance Use Topics  . Smoking status: Former Research scientist (life sciences)  . Smokeless tobacco: None  . Alcohol Use: No    Review of Systems Constitutional: Negative for fever. Cardiovascular: Positive for chest pain radiating to his back. Respiratory: Negative for shortness of breath. Gastrointestinal: Negative for abdominal pain Neurological: Negative for headache 10-point ROS  otherwise negative.  ____________________________________________   PHYSICAL EXAM:  VITAL SIGNS: ED Triage Vitals  Enc Vitals Group     BP 09/18/15 0832 111/61 mmHg     Pulse Rate 09/18/15 0832 95     Resp 09/18/15 0832 20     Temp 09/18/15 0832 98.6 F (37 C)     Temp Source 09/18/15 0832 Oral     SpO2 09/18/15 0832 95 %     Weight 09/18/15 0832 189 lb (85.73 kg)     Height 09/18/15 0832 5\' 11"  (1.803 m)     Head Cir --      Peak Flow --      Pain Score 09/18/15 0834 8     Pain Loc --      Pain Edu? --      Excl. in [place]? --     Constitutional: Alert and oriented. Well appearing and in no distress. Eyes: Normal exam ENT   Head: Normocephalic and atraumatic   Mouth/Throat: Mucous membranes are moist. Cardiovascular: Normal rate, regular rhythm. No murmur Respiratory: Normal respiratory effort without tachypnea nor retractions. Breath sounds are clear Gastrointestinal: Soft and nontender. No distention. Musculoskeletal: Nontender with normal range of motion in all extremities. No lower extremity edema or tenderness palpation. Good range of motion left ankle, 2+ DP pulse, no abnormalities identified on ankle exam. No tenderness to palpation. Neurologic:  Normal speech and language. No gross focal neurologic deficits are appreciated. Speech is normal. Skin:  Skin is warm, dry and intact.  Psychiatric: Mood and affect are normal. Speech and behavior are normal.   ____________________________________________  EKG  EKG reviewed and interpreted by myself shows sinus rhythm at 99 bpm, widened QRS, normal axis, consistent with right bundle branch block with nonspecific ST changes.  ____________________________________________    RADIOLOGY  Chest x-ray shows no acute abnormality  ____________________________________________    INITIAL IMPRESSION / ASSESSMENT AND PLAN / ED COURSE  Pertinent labs & imaging results that were available during my care of the patient  were reviewed by me and considered in my medical decision making (see chart for details).  Patient presents the emergency department with central chest pain radiating to his back 30 hours. Given his history of blood clot in the past, pain radiating to his back with a normal chest x-ray we'll proceed with a CT scan of the chest to rule out pulmonary emboli. We will also obtain labs including lipase, troponin, to help further evaluate. We will treat the patient's pain and discomfort while awaiting lab results.  CT shows no PE or consolidation. We'll admit the patient to the hospital given his chest pain with elevated troponin.  ____________________________________________   FINAL CLINICAL IMPRESSION(S) / ED DIAGNOSES  Chest pain   Harvest Dark, MD 09/18/15 1053

## 2015-09-18 NOTE — H&P (Signed)
[place] [facility] [place] NAME: Karl Norman    MR#:  [id number]  [place] OF BIRTH:  Sep 21, 1946  DATE OF ADMISSION:  09/18/2015  PRIMARY CARE PHYSICIAN: None  REQUESTING/REFERRING PHYSICIAN: Harvest Dark  CHIEF COMPLAINT:   Chief Complaint  Patient presents with  . Chest Pain    HISTORY OF PRESENT ILLNESS:  Karl Norman  is a 69 y.o. male with a known history of chronic pancreatitis and gastroesophageal reflux disease. He presents to the ER today and he could not get out of bed at 5:30 AM when he woke up. He could not put weight on his ankle. For the last few days he's been having back pain and he had to sleep in the recliner. The pain is severe 7-8 out of 10 in intensity with movement. In certain situations he has no pain. The pain starts from his spine and radiates through to his chest. A couple weeks ago he did have a fever but he is been having some chills. In the ER a CT scan of the chest was negative for pulmonary embolism or infection in the lungs. In the ER, he was found to have an elevated white count and a borderline troponin and hospitalist services were contacted for further evaluation. No recent dental work.  PAST MEDICAL HISTORY:   Past Medical History  Diagnosis Date  . GERD (gastroesophageal reflux disease)   . Pancreatitis   . CHF (congestive heart failure) ([place])     PAST SURGICAL HISTORY:   Past Surgical History  Procedure Laterality Date  . Replacement total knee      SOCIAL HISTORY:   Social History  Substance Use Topics  . Smoking status: Former Research scientist (life sciences)  . Smokeless tobacco: Not on file  . Alcohol Use: No    FAMILY HISTORY:  No family history on file.  DRUG ALLERGIES:   Allergies  Allergen Reactions  . Penicillins     Has patient had a PCN reaction causing immediate rash, facial/tongue/throat swelling, SOB or lightheadedness with hypotension: NO Has patient had a PCN reaction causing severe rash  involving mucus membranes or skin necrosis: YES Has patient had a PCN reaction that required hospitalization: YES Has patient had a PCN reaction occurring within the last 10 years: NO If all of the above answers are "NO", then may proceed with Cephalosporin use.     REVIEW OF SYSTEMS:  CONSTITUTIONAL: Positive for fever a couple weeks ago. Positive for chills. Positive for fatigue.  EYES: No blurred or double vision. Wears glasses. EARS, NOSE, AND THROAT: No tinnitus or ear pain. No sore throat. Decreased hearing. Positive for postnasal drip. RESPIRATORY: No cough, shortness of breath, wheezing or hemoptysis.  CARDIOVASCULAR: Positive for chest pain. No orthopnea, edema.  GASTROINTESTINAL: Positive for acid reflux. No nausea, vomiting or abdominal pain. No blood in bowel movements. Positive for diarrhea last few days GENITOURINARY: Positive for dark urine last 3 or 4 days. ENDOCRINE: No polyuria, nocturia,  HEMATOLOGY: No anemia, easy bruising or bleeding SKIN: Positive for itching all over MUSCULOSKELETAL: No joint pain or arthritis.   NEUROLOGIC: No tingling, numbness, weakness.  PSYCHIATRY: No anxiety or depression.   MEDICATIONS AT HOME:   Prior to Admission medications   Medication Sig Start Date End Date Taking? Authorizing Provider  ibuprofen (ADVIL,MOTRIN) 200 MG tablet Take 600 mg by mouth 2 (two) times daily.    Yes Historical Provider, MD  omeprazole (PRILOSEC) 20 MG capsule Take 20 mg by mouth  daily.  01/21/15  Yes Historical Provider, MD      VITAL SIGNS:  Blood pressure 112/67, pulse 62, temperature 98.6 F (37 C), temperature source Oral, resp. rate 18, height 5\' 11"  (1.803 m), weight 85.73 kg (189 lb), SpO2 94 %.  PHYSICAL EXAMINATION:  GENERAL:  69 y.o.-year-old patient lying in the bed with no acute distress.  EYES: Pupils equal, round, reactive to light and accommodation. No scleral icterus. Extraocular muscles intact.  HEENT: Head atraumatic, normocephalic.  Oropharynx and nasopharynx clear.  NECK:  Supple, no jugular venous distention. No thyroid enlargement, no tenderness.  LUNGS: Normal breath sounds bilaterally, no wheezing, rales,rhonchi or crepitation. No use of accessory muscles of respiration.  CARDIOVASCULAR: S1, S2 normal. 2/6 systolic murmur, no rubs, or gallops.  ABDOMEN: Soft, nontender, nondistended. Bowel sounds present. No organomegaly or mass.  EXTREMITIES: No pedal edema, cyanosis, or clubbing. Patient has some tenderness over the thoracic spine between his shoulder blades. NEUROLOGIC: Cranial nerves II through XII are intact. Muscle strength 5/5 in all extremities. Sensation intact. Gait not checked.  PSYCHIATRIC: The patient is alert and oriented x 3.  SKIN: Erythema over the back which is blanching and warm. Erythema lateral left ankle which is warm to touch.  LABORATORY PANEL:   CBC  Recent Labs Lab 09/18/15 0837  WBC 21.0*  HGB 13.3  HCT 40.9  PLT 206   ------------------------------------------------------------------------------------------------------------------  Chemistries   Recent Labs Lab 09/18/15 0837  NA 132*  K 3.9  CL 104  CO2 20*  GLUCOSE 137*  BUN 16  CREATININE 0.93  CALCIUM 9.0  AST 27  ALT 36  ALKPHOS 278*  BILITOT 3.9*   ------------------------------------------------------------------------------------------------------------------  Cardiac Enzymes  Recent Labs Lab 09/18/15 0837  TROPONINI 0.10*   ------------------------------------------------------------------------------------------------------------------  RADIOLOGY:  Dg Chest 2 View  09/18/2015  CLINICAL DATA:  Chest pain and shortness of breath with exertion EXAM: CHEST  2 VIEW COMPARISON:  December 15, 2010 FINDINGS: There is slight bibasilar atelectasis. There is no edema or consolidation. The heart size is upper normal with pulmonary vascularity within normal limits. No adenopathy. There is degenerative change in  the thoracic spine and shoulders. IMPRESSION: Bibasilar atelectatic change.  No edema or consolidation. Electronically Signed   By: Lowella Grip III M.D.   On: 09/18/2015 08:59   Ct Angio Chest Pe W/cm &/or Wo Cm  09/18/2015  CLINICAL DATA:  Chest pain and shortness of Breath EXAM: CT ANGIOGRAPHY CHEST WITH CONTRAST TECHNIQUE: Multidetector CT imaging of the chest was performed using the standard protocol during bolus administration of intravenous contrast. Multiplanar CT image reconstructions and MIPs were obtained to evaluate the vascular anatomy. CONTRAST:  165mL OMNIPAQUE IOHEXOL 350 MG/ML SOLN COMPARISON:  Chest CT December 02, 2010; chest radiograph September 18, 2015 FINDINGS: There is no demonstrable pulmonary embolus. There is atherosclerotic calcification in the aorta. There is no thoracic aortic aneurysm or dissection. There is calcification in the proximal great vessels. The visualize great vessels otherwise appear normal. Note that the right and left common carotid arteries arise as a common trunk, an anatomic variant. There is patchy bibasilar atelectatic change. There are a few small bullae in the upper lobes. There is no edema or consolidation. Thyroid appears normal. There is no demonstrable thoracic adenopathy. There are multiple foci of coronary artery calcification. The pericardium is not thickened. There is left ventricular hypertrophy. In the visualized upper abdomen, there is chronic dilatation of the pancreatic duct, likely residua of chronic pancreatitis. There is a stable right  adrenal adenoma measuring 2.4 x 1.3 cm. There is degenerative change in the thoracic spine. There are no blastic or lytic bone lesions. Review of the MIP images confirms the above findings. IMPRESSION: No demonstrable pulmonary embolus. Areas of atelectatic change. No edema or consolidation. No adenopathy. Areas of atherosclerotic calcification. Foci of coronary artery calcification noted. There is left  ventricular hypertrophy. Evidence suggesting a degree of chronic pancreatitis, stable. Stable right adrenal adenoma. Electronically Signed   By: Lowella Grip III M.D.   On: 09/18/2015 10:27    EKG:   Normal sinus rhythm 99 bpm premature atrial complexes, LVH, right bundle branch block.  IMPRESSION AND PLAN:   1. Cellulitis of the back and left ankle with leukocytosis. Since the patient does have back pain on the thoracic spine radiating to the chest I will get an MRI of the thoracic spine to rule out a discitis. I will get blood cultures 2. Start IV Rocephin. 2. Elevated troponin- serial cardiac enzymes. Monitor on telemetry. Cardiology consultation. I think this is demand ischemia from infection. 3. Gastroesophageal reflux disease without esophagitis on PPI 4. History of pancreatitis 5. Adrenal adenoma seen on CT scan 6. Hyponatremia I will give gentle IV fluid hydration 7. Impaired fasting glucose- check a hemoglobin A1c  All the records are reviewed and case discussed with ED provider. Management plans discussed with the patient, family and they are in agreement.  CODE STATUS: Full code  TOTAL TIME TAKING CARE OF THIS PATIENT: 50 minutes.    Loletha Grayer M.D on 09/18/2015 at 12:55 PM  Between 7am to 6pm - Pager - [phone]  After 6pm call admission pager [facility]  Office  [phone]  CC: Primary care physician; none

## 2015-09-18 NOTE — ED Notes (Signed)
This tech providing pt wife coffee

## 2015-09-18 NOTE — ED Notes (Signed)
Pt also reports left ankle pain upon assessment, pt reports his ankle feels like it is locked up and could not move his toes yesterday. Cms intact.

## 2015-09-18 NOTE — Treatment Plan (Signed)
   Spoke with Dr. Nevada Crane radiology.  He called with results of thoracic spine MRI. He believes that this is infection. He states that there is nothing to biopsy at this point. He does not believe this is a role for neurosurgery at this point or transfer at this point. He recommended continuing antibiotics.  I drew blood cultures before antibiotics started I started Rocephin and I'll add vancomycin. I will get an infectious disease consultation in the morning.  I also called the patient on the phone and gave the patient is up-to-date on the MRI findings. Patient asked for some IV pain medication as needed.  This is been dictated by Dr. Loletha Grayer

## 2015-09-18 NOTE — ED Notes (Addendum)
Pt hooked up to cardiac monitor by this tech, pt given call light for assistance if needed.

## 2015-09-18 NOTE — Progress Notes (Signed)
ANTIBIOTIC CONSULT NOTE - INITIAL  Pharmacy Consult for Vancomycin  Indication: cellulitis  Allergies  Allergen Reactions  . Penicillins     Has patient had a PCN reaction causing immediate rash, facial/tongue/throat swelling, SOB or lightheadedness with hypotension: NO Has patient had a PCN reaction causing severe rash involving mucus membranes or skin necrosis: YES Has patient had a PCN reaction that required hospitalization: YES Has patient had a PCN reaction occurring within the last 10 years: NO If all of the above answers are "NO", then may proceed with Cephalosporin use.     Patient Measurements: Height: 5\' 11"  (180.3 cm) Weight: 190 lb 6.4 oz (86.365 kg) IBW/kg (Calculated) : 75.3 Adjusted Body Weight: 79.7 kg   Vital Signs: Temp: 98.5 F (36.9 C) (12/28 1440) Temp Source: Oral (12/28 1440) BP: 138/88 mmHg (12/28 1652) Pulse Rate: 92 (12/28 1652) Intake/Output from previous day:   Intake/Output from this shift:    Labs:  Recent Labs  09/18/15 0837  WBC 21.0*  HGB 13.3  PLT 206  CREATININE 0.93   Estimated Creatinine Clearance: 82.1 mL/min (by C-G formula based on Cr of 0.93). No results for input(s): VANCOTROUGH, VANCOPEAK, VANCORANDOM, GENTTROUGH, GENTPEAK, GENTRANDOM, TOBRATROUGH, TOBRAPEAK, TOBRARND, AMIKACINPEAK, AMIKACINTROU, AMIKACIN in the last 72 hours.   Microbiology: No results found for this or any previous visit (from the past 720 hour(s)).  Medical History: Past Medical History  Diagnosis Date  . GERD (gastroesophageal reflux disease)   . Pancreatitis   . CHF (congestive heart failure) ([facility])     Medications:  Prescriptions prior to admission  Medication Sig Dispense Refill Last Dose  . ibuprofen (ADVIL,MOTRIN) 200 MG tablet Take 600 mg by mouth 2 (two) times daily.    09/18/2015 at Unknown time  . omeprazole (PRILOSEC) 20 MG capsule Take 20 mg by mouth daily.    09/18/2015 at Unknown time   Assessment: CrCl = 82.1 ml/min Ke =  0.07hr-1 T1/2 = 9.9 hrs Vd = 60.5 L   Goal of Therapy:  Vancomycin trough level 10-15 mcg/ml  Plan:  Expected duration 7 days with resolution of temperature and/or normalization of WBC   Vancomycin 1250 mg IV X 1 given on 12/28 @ 18:00. Vancomycin 1250 mg IV Q18H ordered to start 12/29 @ 5:00, ~ 11 hrs after 1st dose (stacked dosing). This pt will reach C[facility] by 12/30 @ 18:00. Will draw 1st trough on 12/30 @ 16:30, which will be very close to Css.   Karl Norman D 09/18/2015,5:29 PM

## 2015-09-19 ENCOUNTER — Inpatient Hospital Stay
Admit: 2015-09-19 | Discharge: 2015-09-19 | Disposition: A | Discharge status: Home / Self Care | Payer: Medicare Other | Attending: Internal Medicine | Admitting: Internal Medicine

## 2015-09-19 ENCOUNTER — Inpatient Hospital Stay: Admit: 2015-09-19 | Payer: Medicare Other

## 2015-09-19 DIAGNOSIS — I248 Other forms of acute ischemic heart disease: Secondary | ICD-10-CM | POA: Diagnosis not present

## 2015-09-19 DIAGNOSIS — K861 Other chronic pancreatitis: Secondary | ICD-10-CM | POA: Diagnosis not present

## 2015-09-19 DIAGNOSIS — Z96659 Presence of unspecified artificial knee joint: Secondary | ICD-10-CM | POA: Diagnosis not present

## 2015-09-19 DIAGNOSIS — M549 Dorsalgia, unspecified: Secondary | ICD-10-CM | POA: Diagnosis present

## 2015-09-19 DIAGNOSIS — Z880 Allergy status to penicillin: Secondary | ICD-10-CM | POA: Diagnosis not present

## 2015-09-19 DIAGNOSIS — L03312 Cellulitis of back [any part except buttock]: Secondary | ICD-10-CM | POA: Diagnosis present

## 2015-09-19 DIAGNOSIS — E871 Hypo-osmolality and hyponatremia: Secondary | ICD-10-CM | POA: Diagnosis not present

## 2015-09-19 DIAGNOSIS — I509 Heart failure, unspecified: Secondary | ICD-10-CM | POA: Diagnosis not present

## 2015-09-19 DIAGNOSIS — K219 Gastro-esophageal reflux disease without esophagitis: Secondary | ICD-10-CM | POA: Diagnosis not present

## 2015-09-19 DIAGNOSIS — Z79899 Other long term (current) drug therapy: Secondary | ICD-10-CM | POA: Diagnosis not present

## 2015-09-19 DIAGNOSIS — Z87891 Personal history of nicotine dependence: Secondary | ICD-10-CM | POA: Diagnosis not present

## 2015-09-19 DIAGNOSIS — R7301 Impaired fasting glucose: Secondary | ICD-10-CM | POA: Diagnosis not present

## 2015-09-19 DIAGNOSIS — L03116 Cellulitis of left lower limb: Secondary | ICD-10-CM | POA: Diagnosis not present

## 2015-09-19 DIAGNOSIS — Z23 Encounter for immunization: Secondary | ICD-10-CM | POA: Diagnosis not present

## 2015-09-19 DIAGNOSIS — D3500 Benign neoplasm of unspecified adrenal gland: Secondary | ICD-10-CM | POA: Diagnosis not present

## 2015-09-19 LAB — BASIC METABOLIC PANEL
Anion gap: 4 — ABNORMAL LOW (ref 5–15)
BUN: 18 mg/dL (ref 6–20)
CHLORIDE: 106 mmol/L (ref 101–111)
CO2: 24 mmol/L (ref 22–32)
CREATININE: 1.05 mg/dL (ref 0.61–1.24)
Calcium: 8.9 mg/dL (ref 8.9–10.3)
GFR calc non Af Amer: >60 mL/min (ref >60)
Glucose, Bld: 152 mg/dL — ABNORMAL HIGH (ref 65–99)
POTASSIUM: 5.2 mmol/L — AB (ref 3.5–5.1)
SODIUM: 134 mmol/L — AB (ref 135–145)

## 2015-09-19 LAB — CBC
HEMATOCRIT: 39.8 % — AB (ref 40.0–52.0)
Hemoglobin: 12.9 g/dL — ABNORMAL LOW (ref 13.0–18.0)
MCH: 28.3 pg (ref 26.0–34.0)
MCHC: 32.4 g/dL (ref 32.0–36.0)
MCV: 87.4 fL (ref 80.0–100.0)
Platelets: 190 10*3/uL (ref 150–440)
RBC: 4.55 MIL/uL (ref 4.40–5.90)
RDW: 14.5 % (ref 11.5–14.5)
WBC: 18 10*3/uL — AB (ref 3.8–10.6)

## 2015-09-19 MED ORDER — ENOXAPARIN SODIUM 40 MG/0.4ML ~~LOC~~ SOLN
40.0000 mg | SUBCUTANEOUS | Status: DC
  Administered 2015-09-19: 40 mg via SUBCUTANEOUS
  Filled 2015-09-19: qty 0.4

## 2015-09-19 NOTE — Progress Notes (Signed)
ID e note Reviewed notes, labs, imaging. Admitted with weakness, severe pain in back, fevers, chills.  WBC 21K, ->18,  MRI spine with  Started ceftriaxone and vanco    Rec  Cont current abx If any draainge noted from area of infection please culture Further recs based on cx and clinical response

## 2015-09-19 NOTE — Care Management Obs Status (Signed)
[facility] NOTIFICATION   Patient Details  Name: Auston Garness MRN: [id number] Date of Birth: Jul 16, 1946   Medicare Observation Status Notification Given:  Yes  Late entry.    Beverly Sessions, RN 09/19/2015, 2:20 PM

## 2015-09-19 NOTE — Care Management (Signed)
Patient admitted from home with cellulitis.  Patient lives at home with his wife.  His adult daughter lives locally who is also a NP.  Patient states that he has a walker, cane, crutches, and elevated toilet seat.  Patient states that he recently started using the crutches for mobility.  Patient states that he drives himself when needed.  CM consult placed due to patient not having PCP.  Patient has insurance, provided with list of current PCP who are accepting new patients.  Patient states that he would like to set up his initial appointment after he is discharged from the hospital.  No RNCM needs identified at this time

## 2015-09-19 NOTE — Progress Notes (Signed)
[name] NAME: Karl Norman    MR#:  [id number]  DATE OF BIRTH:  August 08, 1946  SUBJECTIVE:  CHIEF COMPLAINT:   Chief Complaint  Patient presents with  . Chest Pain   Weakness of upper extremities resolved. Back pain improved. Afebrile REVIEW OF SYSTEMS:    Review of Systems  Constitutional: Positive for chills and malaise/fatigue. Negative for fever.  HENT: Negative for sore throat.   Eyes: Negative for blurred vision, double vision and pain.  Respiratory: Negative for cough, hemoptysis, shortness of breath and wheezing.   Cardiovascular: Negative for chest pain, palpitations, orthopnea and leg swelling.  Gastrointestinal: Negative for heartburn, nausea, vomiting, abdominal pain, diarrhea and constipation.  Genitourinary: Negative for dysuria and hematuria.  Musculoskeletal: Positive for back pain. Negative for joint pain.  Skin: Negative for rash.  Neurological: Positive for weakness. Negative for sensory change, speech change, focal weakness and headaches.  Endo/Heme/Allergies: Does not bruise/bleed easily.  Psychiatric/Behavioral: Negative for depression. The patient is not nervous/anxious.       DRUG ALLERGIES:   Allergies  Allergen Reactions  . Penicillins     Has patient had a PCN reaction causing immediate rash, facial/tongue/throat swelling, SOB or lightheadedness with hypotension: NO Has patient had a PCN reaction causing severe rash involving mucus membranes or skin necrosis: YES Has patient had a PCN reaction that required hospitalization: YES Has patient had a PCN reaction occurring within the last 10 years: NO If all of the above answers are "NO", then may proceed with Cephalosporin use.     VITALS:  Blood pressure 131/74, pulse 65, temperature 98.2 F (36.8 C), temperature source Oral, resp. rate 16, height 5\' 11"  (1.803 m), weight 86.365 kg (190 lb 6.4 oz), SpO2 96 %.  PHYSICAL EXAMINATION:    Physical Exam  GENERAL:  69 y.o.-year-old patient lying in the bed with no acute distress.  EYES: Pupils equal, round, reactive to light and accommodation. No scleral icterus. Extraocular muscles intact.  HEENT: Head atraumatic, normocephalic. Oropharynx and nasopharynx clear.  NECK:  Supple, no jugular venous distention. No thyroid enlargement, no tenderness.  LUNGS: Normal breath sounds bilaterally, no wheezing, rales, rhonchi. No use of accessory muscles of respiration.  CARDIOVASCULAR: S1, S2 normal. No murmurs, rubs, or gallops.  ABDOMEN: Soft, nontender, nondistended. Bowel sounds present. No organomegaly or mass.  EXTREMITIES: No cyanosis, clubbing or edema b/l.    NEUROLOGIC: Cranial nerves II through XII are intact. No focal Motor or sensory deficits b/l.   PSYCHIATRIC: The patient is alert and oriented x 3.  SKIN: No obvious rash, lesion, or ulcer.    LABORATORY PANEL:   CBC  Recent Labs Lab 09/19/15 0523  WBC 18.0*  HGB 12.9*  HCT 39.8*  PLT 190   ------------------------------------------------------------------------------------------------------------------  Chemistries   Recent Labs Lab 09/18/15 0837 09/19/15 0523  NA 132* 134*  K 3.9 5.2*  CL 104 106  CO2 20* 24  GLUCOSE 137* 152*  BUN 16 18  CREATININE 0.93 1.05  CALCIUM 9.0 8.9  AST 27  --   ALT 36  --   ALKPHOS 278*  --   BILITOT 3.9*  --    ------------------------------------------------------------------------------------------------------------------  Cardiac Enzymes  Recent Labs Lab 09/18/15 1810  TROPONINI 0.07*   ------------------------------------------------------------------------------------------------------------------  RADIOLOGY:  Dg Chest 2 View  09/18/2015  CLINICAL DATA:  Chest pain and shortness of breath with exertion EXAM: CHEST  2 VIEW COMPARISON:  December 15, 2010 FINDINGS: There is slight  bibasilar atelectasis. There is no edema or consolidation. The heart size  is upper normal with pulmonary vascularity within normal limits. No adenopathy. There is degenerative change in the thoracic spine and shoulders. IMPRESSION: Bibasilar atelectatic change.  No edema or consolidation. Electronically Signed   By: Lowella Grip III M.D.   On: 09/18/2015 08:59   Ct Angio Chest Pe W/cm &/or Wo Cm  09/18/2015  CLINICAL DATA:  Chest pain and shortness of Breath EXAM: CT ANGIOGRAPHY CHEST WITH CONTRAST TECHNIQUE: Multidetector CT imaging of the chest was performed using the standard protocol during bolus administration of intravenous contrast. Multiplanar CT image reconstructions and MIPs were obtained to evaluate the vascular anatomy. CONTRAST:  166mL OMNIPAQUE IOHEXOL 350 MG/ML SOLN COMPARISON:  Chest CT December 02, 2010; chest radiograph September 18, 2015 FINDINGS: There is no demonstrable pulmonary embolus. There is atherosclerotic calcification in the aorta. There is no thoracic aortic aneurysm or dissection. There is calcification in the proximal great vessels. The visualize great vessels otherwise appear normal. Note that the right and left common carotid arteries arise as a common trunk, an anatomic variant. There is patchy bibasilar atelectatic change. There are a few small bullae in the upper lobes. There is no edema or consolidation. Thyroid appears normal. There is no demonstrable thoracic adenopathy. There are multiple foci of coronary artery calcification. The pericardium is not thickened. There is left ventricular hypertrophy. In the visualized upper abdomen, there is chronic dilatation of the pancreatic duct, likely residua of chronic pancreatitis. There is a stable right adrenal adenoma measuring 2.4 x 1.3 cm. There is degenerative change in the thoracic spine. There are no blastic or lytic bone lesions. Review of the MIP images confirms the above findings. IMPRESSION: No demonstrable pulmonary embolus. Areas of atelectatic change. No edema or consolidation. No  adenopathy. Areas of atherosclerotic calcification. Foci of coronary artery calcification noted. There is left ventricular hypertrophy. Evidence suggesting a degree of chronic pancreatitis, stable. Stable right adrenal adenoma. Electronically Signed   By: Lowella Grip III M.D.   On: 09/18/2015 10:27   Mr Thoracic Spine W Wo Contrast  09/18/2015  ADDENDUM REPORT: 09/18/2015 17:24 ADDENDUM: Study discussed by telephone with Dr. Loletha Grayer on 09/18/2015 at 1709 hours. He confirmed no known malignancy in this patient. He advised also that the clinical exam raises the possibility of posterior thoracic cellulitis. We discussed empiric treatment for infection (IV antibiotics) and followup if no improvement. Electronically Signed   By: Genevie Ann M.D.   On: 09/18/2015 17:24  09/18/2015  CLINICAL DATA:  69 year old male with recent back pain, unable to bear weight on his lower extremities today. Leukocytosis. Initial encounter. EMR states history of "pancreatitis" . EXAM: MRI THORACIC SPINE WITHOUT AND WITH CONTRAST TECHNIQUE: Multiplanar and multiecho pulse sequences of the thoracic spine were obtained without and with intravenous contrast. CONTRAST:  48mL MULTIHANCE GADOBENATE DIMEGLUMINE 529 MG/ML IV SOLN COMPARISON:  Chest CTA 1004 hours today.  Chest CT 12/05/2010. FINDINGS: Limited sagittal imaging of the cervical spine reveals diffusely advanced cervical disc and endplate degeneration. 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 There is abnormal upper thoracic dural thickening and enhancement (series 11, image 8). This extends from the cervicothoracic junction to the lower T3 level. This appears most pronounced at the T1-T2 level (see series 12, image 6) where circumferential dural or epidural thickening and enhancement is noted. There does appear to be trace prevertebral fluid from the T1 to the T3 levels (series 6, images 8 and 9), and there is  nonspecific abnormal paraspinal soft  tissue thickening and edema at the same levels eccentric to the right (see series 7, image 8). However, there is no associated marrow edema. On the CT earlier today no osteolysis. Advanced chronic disc and endplate degeneration throughout these levels and also in the adjacent lower cervical spine. Subsequently there is thoracic spinal stenosis maximal at T1-T2 with up to mild spinal cord compression. No cord signal abnormality identified. Mild degenerative appearing anterolisthesis at T2-T3. Right greater than left upper thoracic facet arthropathy. Elsewhere normal thoracic vertebral height and alignment. No marrow edema or evidence of acute osseous abnormality. No thoracic spinal stenosis below the T2 level. No other abnormal paraspinal soft tissues. No other abnormal thoracic dural thickening. There is mildly increased leptomeningeal spinal cord vasculature at the T8-T9 level (series 11, image 7), but this is not evident on T2 weighted images and is felt to be physiologic. The conus medullaris appears to be normal at T12-L1. Stable visualized thoracic and upper abdominal viscera. There is a small chronic right adrenal mass which appears stable to regressed since 12/02/2010. IMPRESSION: 1. Abnormal dural/epidural thickening and enhancement from the cervicothoracic junction to the T3 level, with surrounding paraspinal soft tissue edema, trace prevertebral fluid, and associated with spinal stenosis and cord compression at T1-T2, but no associated cord signal abnormality. 2. Top differential considerations include acute spinal infection (although absent marrow edema such that there is NO evidence of osteomyelitis) versus soft tissue neoplasm or metastasis (although no suspicious bone changes on CT to suggest bone metastasis). 3. Negative for age thoracic spine elsewhere. 4. Chronic right adrenal nodule appears stable to mildly regressed since 2012. Electronically Signed: By: Genevie Ann M.D. On: 09/18/2015 16:47      ASSESSMENT AND PLAN:   1. Acute spinal infection Cellulitis of back much improved. No open wounds. Check echo. Wait for Blood cx. Continue IV abx. Weakness of upper extremities resolved. ID consulted. No abscess on MRI  2. Elevated troponin- serial cardiac enzymes. Monitor on telemetry. Cardiology consultation appreciated. demand ischemia from infection.  3. Gastroesophageal reflux disease without esophagitis on PPI  4. History of pancreatitis- stable  5. Adrenal adenoma seen on CT scan - Incidental finding  6. Hyponatremia I will give gentle IV fluid hydration   All the records are reviewed and case discussed with Care Management/Social Workerr. Management plans discussed with the patient, family and they are in agreement.  CODE STATUS: FULL  DVT Prophylaxis: SCDs  TOTAL TIME TAKING CARE OF THIS PATIENT: 40 minutes.   POSSIBLE D/C IN 2-3 DAYS, DEPENDING ON CLINICAL CONDITION.   Hillary Bow R M.D on 09/19/2015 at 1:23 PM  Between 7am to 6pm - Pager - [phone]  After 6pm go to [email or web address] - password [email or web address] [facility]  [facility]  Office  [phone]  CC: Primary care physician; No primary care provider on file.    Note: This dictation was prepared with Dragon dictation along with smaller phrase technology. Any transcriptional errors that result from this process are unintentional.

## 2015-09-19 NOTE — Progress Notes (Signed)
*  PRELIMINARY RESULTS* Echocardiogram 2D Echocardiogram has been performed.  Karl Norman 09/19/2015, 7:42 PM

## 2015-09-19 NOTE — Consult Note (Signed)
[facility] Consultation Note  Patient ID: Karl Norman, MRN: [id number], DOB/AGE: 1946/01/22 69 y.o. Admit date: 09/18/2015   Date of Consult: 09/19/2015 Primary Physician: No primary care provider on file. Primary Cardiologist: None  Chief Complaint:  Chief Complaint  Patient presents with  . Chest Pain   Reason for Consult: chest pain with elevated troponin  HPI: 69 y.o. male with known chronic pancreatitis who is had new onset of shortness of breath and chest pain with a stabbing chest pain from his center of the chest through his back. This was waxing and waning over a several hour period and was seen in the emergency room with a troponin of 0.1 and an EKG showing normal sinus rhythm with right bundle branch block. The patient has had some respiratory infection type issues and appears to be needing further treatment for this issue. The patient has had no evidence of chest pain overnight after full relief and appropriate antibiotics and oxygen. There has been no evidence of acute coronary syndrome at this time an elevated troponin is most consistent with demand ischemia  Past Medical History  Diagnosis Date  . GERD (gastroesophageal reflux disease)   . Pancreatitis   . CHF (congestive heart failure) [facility])       Surgical History:  Past Surgical History  Procedure Laterality Date  . Replacement total knee       Home Meds: Prior to Admission medications   Medication Sig Start Date End Date Taking? Authorizing Provider  ibuprofen (ADVIL,MOTRIN) 200 MG tablet Take 600 mg by mouth 2 (two) times daily.    Yes Historical Provider, MD  omeprazole (PRILOSEC) 20 MG capsule Take 20 mg by mouth daily.  01/21/15  Yes Historical Provider, MD    Inpatient Medications:  . cefTRIAXone (ROCEPHIN)  IV  2 g Intravenous Q12H  . dexamethasone  10 mg Intravenous Q12H  . Influenza vac split quadrivalent PF  0.5 mL Intramuscular Tomorrow-1000  . pantoprazole  40 mg Oral Daily  .  vancomycin  1,250 mg Intravenous Q18H   . sodium chloride 50 mL/hr at 09/18/15 1640    Allergies:  Allergies  Allergen Reactions  . Penicillins     Has patient had a PCN reaction causing immediate rash, facial/tongue/throat swelling, SOB or lightheadedness with hypotension: NO Has patient had a PCN reaction causing severe rash involving mucus membranes or skin necrosis: YES Has patient had a PCN reaction that required hospitalization: YES Has patient had a PCN reaction occurring within the last 10 years: NO If all of the above answers are "NO", then may proceed with Cephalosporin use.     Social History   Social History  . Marital Status: Married    Spouse Name: N/A  . Number of Children: N/A  . Years of Education: N/A   Occupational History  . Not on file.   Social History Main Topics  . Smoking status: Former Research scientist (life sciences)  . Smokeless tobacco: Not on file  . Alcohol Use: No  . Drug Use: Not on file  . Sexual Activity: Not on file   Other Topics Concern  . Not on file   Social History Narrative  . No narrative on file     No family history on file.   Review of Systems Positive for chest pain shortness of breath congestion Negative for: General:  chills, fever, night sweats or weight changes.  Cardiovascular: PND orthopnea syncope dizziness  Dermatological skin lesions rashes Respiratory: Cough positive for congestion Urologic: Frequent urination  urination at night and hematuria Abdominal: negative for nausea, vomiting, diarrhea, bright red blood per rectum, melena, or hematemesis Neurologic: negative for visual changes, and/or hearing changes  All other systems reviewed and are otherwise negative except as noted above.  Labs:  Recent Labs  09/18/15 0837 09/18/15 1810  TROPONINI 0.06*  0.10* 0.07*   Lab Results  Component Value Date   WBC 18.0* 09/19/2015   HGB 12.9* 09/19/2015   HCT 39.8* 09/19/2015   MCV 87.4 09/19/2015   PLT 190 09/19/2015     Recent Labs Lab 09/18/15 0837 09/19/15 0523  NA 132* 134*  K 3.9 5.2*  CL 104 106  CO2 20* 24  BUN 16 18  CREATININE 0.93 1.05  CALCIUM 9.0 8.9  PROT 7.2  --   BILITOT 3.9*  --   ALKPHOS 278*  --   ALT 36  --   AST 27  --   GLUCOSE 137* 152*   No results found for: CHOL, HDL, LDLCALC, TRIG No results found for: DDIMER  Radiology/Studies:  Dg Chest 2 View  09/18/2015  CLINICAL DATA:  Chest pain and shortness of breath with exertion EXAM: CHEST  2 VIEW COMPARISON:  December 15, 2010 FINDINGS: There is slight bibasilar atelectasis. There is no edema or consolidation. The heart size is upper normal with pulmonary vascularity within normal limits. No adenopathy. There is degenerative change in the thoracic spine and shoulders. IMPRESSION: Bibasilar atelectatic change.  No edema or consolidation. Electronically Signed   By: Lowella Grip III M.D.   On: 09/18/2015 08:59   Ct Angio Chest Pe W/cm &/or Wo Cm  09/18/2015  CLINICAL DATA:  Chest pain and shortness of Breath EXAM: CT ANGIOGRAPHY CHEST WITH CONTRAST TECHNIQUE: Multidetector CT imaging of the chest was performed using the standard protocol during bolus administration of intravenous contrast. Multiplanar CT image reconstructions and MIPs were obtained to evaluate the vascular anatomy. CONTRAST:  135mL OMNIPAQUE IOHEXOL 350 MG/ML SOLN COMPARISON:  Chest CT December 02, 2010; chest radiograph September 18, 2015 FINDINGS: There is no demonstrable pulmonary embolus. There is atherosclerotic calcification in the aorta. There is no thoracic aortic aneurysm or dissection. There is calcification in the proximal great vessels. The visualize great vessels otherwise appear normal. Note that the right and left common carotid arteries arise as a common trunk, an anatomic variant. There is patchy bibasilar atelectatic change. There are a few small bullae in the upper lobes. There is no edema or consolidation. Thyroid appears normal. There is no  demonstrable thoracic adenopathy. There are multiple foci of coronary artery calcification. The pericardium is not thickened. There is left ventricular hypertrophy. In the visualized upper abdomen, there is chronic dilatation of the pancreatic duct, likely residua of chronic pancreatitis. There is a stable right adrenal adenoma measuring 2.4 x 1.3 cm. There is degenerative change in the thoracic spine. There are no blastic or lytic bone lesions. Review of the MIP images confirms the above findings. IMPRESSION: No demonstrable pulmonary embolus. Areas of atelectatic change. No edema or consolidation. No adenopathy. Areas of atherosclerotic calcification. Foci of coronary artery calcification noted. There is left ventricular hypertrophy. Evidence suggesting a degree of chronic pancreatitis, stable. Stable right adrenal adenoma. Electronically Signed   By: Lowella Grip III M.D.   On: 09/18/2015 10:27   Mr Thoracic Spine W Wo Contrast  09/18/2015  ADDENDUM REPORT: 09/18/2015 17:24 ADDENDUM: Study discussed by telephone with Dr. Loletha Grayer on 09/18/2015 at 1709 hours. He confirmed no known malignancy in this  patient. He advised also that the clinical exam raises the possibility of posterior thoracic cellulitis. We discussed empiric treatment for infection (IV antibiotics) and followup if no improvement. Electronically Signed   By: Genevie Ann M.D.   On: 09/18/2015 17:24  09/18/2015  CLINICAL DATA:  69 year old male with recent back pain, unable to bear weight on his lower extremities today. Leukocytosis. Initial encounter. EMR states history of "pancreatitis" . EXAM: MRI THORACIC SPINE WITHOUT AND WITH CONTRAST TECHNIQUE: Multiplanar and multiecho pulse sequences of the thoracic spine were obtained without and with intravenous contrast. CONTRAST:  29mL MULTIHANCE GADOBENATE DIMEGLUMINE 529 MG/ML IV SOLN COMPARISON:  Chest CTA 1004 hours today.  Chest CT 12/05/2010. FINDINGS: Limited sagittal imaging of the  cervical spine reveals diffusely advanced cervical disc and endplate degeneration. 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 There is abnormal upper thoracic dural thickening and enhancement (series 11, image 8). This extends from the cervicothoracic junction to the lower T3 level. This appears most pronounced at the T1-T2 level (see series 12, image 6) where circumferential dural or epidural thickening and enhancement is noted. There does appear to be trace prevertebral fluid from the T1 to the T3 levels (series 6, images 8 and 9), and there is nonspecific abnormal paraspinal soft tissue thickening and edema at the same levels eccentric to the right (see series 7, image 8). However, there is no associated marrow edema. On the CT earlier today no osteolysis. Advanced chronic disc and endplate degeneration throughout these levels and also in the adjacent lower cervical spine. Subsequently there is thoracic spinal stenosis maximal at T1-T2 with up to mild spinal cord compression. No cord signal abnormality identified. Mild degenerative appearing anterolisthesis at T2-T3. Right greater than left upper thoracic facet arthropathy. Elsewhere normal thoracic vertebral height and alignment. No marrow edema or evidence of acute osseous abnormality. No thoracic spinal stenosis below the T2 level. No other abnormal paraspinal soft tissues. No other abnormal thoracic dural thickening. There is mildly increased leptomeningeal spinal cord vasculature at the T8-T9 level (series 11, image 7), but this is not evident on T2 weighted images and is felt to be physiologic. The conus medullaris appears to be normal at T12-L1. Stable visualized thoracic and upper abdominal viscera. There is a small chronic right adrenal mass which appears stable to regressed since 12/02/2010. IMPRESSION: 1. Abnormal dural/epidural thickening and enhancement from the cervicothoracic junction to the T3 level, with surrounding paraspinal  soft tissue edema, trace prevertebral fluid, and associated with spinal stenosis and cord compression at T1-T2, but no associated cord signal abnormality. 2. Top differential considerations include acute spinal infection (although absent marrow edema such that there is NO evidence of osteomyelitis) versus soft tissue neoplasm or metastasis (although no suspicious bone changes on CT to suggest bone metastasis). 3. Negative for age thoracic spine elsewhere. 4. Chronic right adrenal nodule appears stable to mildly regressed since 2012. Electronically Signed: By: Genevie Ann M.D. On: 09/18/2015 16:47    EKG: Normal sinus rhythm with right bundle branch block  Weights: Filed Weights   09/18/15 0832 09/18/15 1434  Weight: 189 lb (85.73 kg) 190 lb 6.4 oz (86.365 kg)     Physical Exam: Blood pressure 125/61, pulse 67, temperature 98.4 F (36.9 C), temperature source Oral, resp. rate 21, height 5\' 11"  (1.803 m), weight 190 lb 6.4 oz (86.365 kg), SpO2 95 %. Body mass index is 26.57 kg/(m^2). General: Well developed, well nourished, in no acute distress. Head eyes ears nose throat: Normocephalic, atraumatic, sclera non-icteric, no  xanthomas, nares are without discharge. No apparent thyromegaly and/or mass  Lungs: Normal respiratory effort. Diffuse wheezes, no rales, no rhonchi.  Heart: RRR with normal S1 S2. 2-3+ mitral murmur gallop, no rub, PMI is normal size and placement, carotid upstroke normal without bruit, jugular venous pressure is normal Abdomen: Soft, non-tender, non-distended with normoactive bowel sounds. No hepatomegaly. No rebound/guarding. No obvious abdominal masses. Abdominal aorta is normal size without bruit Extremities: Trace edema. no cyanosis, no clubbing, no ulcers  Peripheral : 2+ bilateral upper extremity pulses, 2+ bilateral femoral pulses, 2+ bilateral dorsal pedal pulse Neuro: Alert and oriented. No facial asymmetry. No focal deficit. Moves all extremities  spontaneously. Musculoskeletal: Normal muscle tone without kyphosis Psych:  Responds to questions appropriately with a normal affect.    Assessment: 69 year old male with a chronic prank or otitis with acute episode of chest pain and upper respiratory tract infection and bronchitis type symptoms with an abnormal EKG and elevation of troponin more consistent with demand ischemia rather than acute coronary syndrome  Plan: 1. No further cardiac workup at this time due to no concerns of acute coronary syndrome 2. Continue telemetry for evidence of rhythm disturbances contributing to above 3. Continue antibiotics and other medical management treatment for infection 4. Consider echocardiogram for LV systolic dysfunction and valvular heart disease with heart murmur 5. Ambulation and follow for further significant symptoms  Signed, Corey Skains M.D. [facility] 09/19/2015, 8:28 AM

## 2015-09-20 ENCOUNTER — Inpatient Hospital Stay: Payer: Medicare Other

## 2015-09-20 DIAGNOSIS — L03312 Cellulitis of back [any part except buttock]: Secondary | ICD-10-CM | POA: Diagnosis not present

## 2015-09-20 LAB — CBC WITH DIFFERENTIAL/PLATELET
BASOS PCT: 0 %
Basophils Absolute: 0 10*3/uL (ref 0–0.1)
EOS ABS: 0 10*3/uL (ref 0–0.7)
Eosinophils Relative: 0 %
HCT: 39.2 % — ABNORMAL LOW (ref 40.0–52.0)
HEMOGLOBIN: 12.4 g/dL — AB (ref 13.0–18.0)
Lymphocytes Relative: 8 %
Lymphs Abs: 1.3 10*3/uL (ref 1.0–3.6)
MCH: 27.2 pg (ref 26.0–34.0)
MCHC: 31.7 g/dL — AB (ref 32.0–36.0)
MCV: 86 fL (ref 80.0–100.0)
MONO ABS: 0.6 10*3/uL (ref 0.2–1.0)
MONOS PCT: 4 %
NEUTROS PCT: 88 %
Neutro Abs: 13.7 10*3/uL — ABNORMAL HIGH (ref 1.4–6.5)
PLATELETS: 210 10*3/uL (ref 150–440)
RBC: 4.56 MIL/uL (ref 4.40–5.90)
RDW: 14.5 % (ref 11.5–14.5)
WBC: 15.6 10*3/uL — ABNORMAL HIGH (ref 3.8–10.6)

## 2015-09-20 LAB — HEPATIC FUNCTION PANEL
ALK PHOS: 177 U/L — AB (ref 38–126)
ALT: 24 U/L (ref 17–63)
AST: 24 U/L (ref 15–41)
Albumin: 2.4 g/dL — ABNORMAL LOW (ref 3.5–5.0)
BILIRUBIN DIRECT: 0.7 mg/dL — AB (ref 0.1–0.5)
BILIRUBIN INDIRECT: 0.9 mg/dL (ref 0.3–0.9)
BILIRUBIN TOTAL: 1.6 mg/dL — AB (ref 0.3–1.2)
Total Protein: 6.4 g/dL — ABNORMAL LOW (ref 6.5–8.1)

## 2015-09-20 LAB — BASIC METABOLIC PANEL
Anion gap: 5 (ref 5–15)
BUN: 24 mg/dL — ABNORMAL HIGH (ref 6–20)
CALCIUM: 9.3 mg/dL (ref 8.9–10.3)
CO2: 25 mmol/L (ref 22–32)
CREATININE: 0.83 mg/dL (ref 0.61–1.24)
Chloride: 110 mmol/L (ref 101–111)
GFR calc non Af Amer: >60 mL/min (ref >60)
Glucose, Bld: 157 mg/dL — ABNORMAL HIGH (ref 65–99)
Potassium: 5.1 mmol/L (ref 3.5–5.1)
Sodium: 140 mmol/L (ref 135–145)

## 2015-09-20 LAB — VANCOMYCIN, RANDOM: VANCOMYCIN RM: 13 ug/mL

## 2015-09-20 LAB — C-REACTIVE PROTEIN: CRP: 13.1 mg/dL — AB (ref <1.0)

## 2015-09-20 MED ORDER — VANCOMYCIN HCL 10 G IV SOLR: 1500.0000 mg | Freq: Two times a day (BID) | INTRAVENOUS | Status: DC

## 2015-09-20 MED ORDER — OXYCODONE HCL 5 MG PO TABS: 5.0000 mg | ORAL_TABLET | ORAL | Status: DC | PRN

## 2015-09-20 MED ORDER — VANCOMYCIN HCL 10 G IV SOLR
1500.0000 mg | Freq: Two times a day (BID) | INTRAVENOUS | Status: DC
  Administered 2015-09-20: 1500 mg via INTRAVENOUS
  Filled 2015-09-20 (×2): qty 1500

## 2015-09-20 MED ORDER — CEFTRIAXONE SODIUM 2 G IJ SOLR: 2.0000 g | Freq: Two times a day (BID) | INTRAMUSCULAR | Status: DC

## 2015-09-20 NOTE — Progress Notes (Signed)
Infectious Disease Long Term IV Antibiotic Orders  Diagnosis: T spinal infection   Culture results Results for orders placed or performed during the hospital encounter of 09/18/15  CULTURE, BLOOD (ROUTINE X 2) w Reflex to PCR ID Panel     Status: None (Preliminary result)   Collection Time: 09/18/15 12:34 PM  Result Value Ref Range Status   Specimen Description BLOOD RIGHT ASSIST CONTROL  Final   Special Requests BOTTLES DRAWN AEROBIC AND ANAEROBIC [place]  Final   Culture NO GROWTH 1 DAY  Final   Report Status PENDING  Incomplete  CULTURE, BLOOD (ROUTINE X 2) w Reflex to PCR ID Panel     Status: None (Preliminary result)   Collection Time: 09/18/15 12:40 PM  Result Value Ref Range Status   Specimen Description BLOOD LEFT ASSIST CONTROL  Final   Special Requests BOTTLES DRAWN AEROBIC AND ANAEROBIC [place]  Final   Culture NO GROWTH 1 DAY  Final   Report Status PENDING  Incomplete     Allergies:  Allergies  Allergen Reactions  . Penicillins     Has patient had a PCN reaction causing immediate rash, facial/tongue/throat swelling, SOB or lightheadedness with hypotension: NO Has patient had a PCN reaction causing severe rash involving mucus membranes or skin necrosis: YES Has patient had a PCN reaction that required hospitalization: YES Has patient had a PCN reaction occurring within the last 10 years: NO If all of the above answers are "NO", then may proceed with Cephalosporin use.     Discharge antibiotics Vancomycin       1500           mg  every     12          hours .     Goal vancomycin trough 15-20.     Pharmacy to adjust dosing based on levels Ceftriaxone 2 grams every      12   hours  PICC Care per protocol Labs weekly while on IV antibiotics     FAX weekly labs to [phone]  CBC w diff   Comprehensive met panel Vancomycin Trough   Esr, crp   Planned duration of antibiotics 2 weeks tentative  Stop date 10/02/15 - tentative Follow up clinic date  Jan 11th 830 AM  Leonel Ramsay, MD

## 2015-09-20 NOTE — Discharge Summary (Signed)
[facility] [place] NAME: Karl Norman    MR#:  [id number]  DATE OF BIRTH:  1945-11-13  DATE OF ADMISSION:  09/18/2015 ADMITTING PHYSICIAN: Loletha Grayer, MD  DATE OF DISCHARGE: 09/20/2015  PRIMARY CARE PHYSICIAN: Adrian Prows   ADMISSION DIAGNOSIS:  Back pain [M54.9] Chest pain, unspecified chest pain type [R07.9]  DISCHARGE DIAGNOSIS:  Active Problems:   Cellulitis   SECONDARY DIAGNOSIS:   Past Medical History  Diagnosis Date  . GERD (gastroesophageal reflux disease)   . Pancreatitis   . CHF (congestive heart failure) ([place])     HOSPITAL COURSE:   1. Spinal infection with leukocytosis. Patient presented with back pain. MRI of the spine showed area of infection. Patient was started on Rocephin 2 g every 12 hours and vancomycin with pharmacy dosing. Blood cultures were negative. Patient was seen in consultation by Dr. Ola Spurr infectious disease. He recommended continuing antibiotics for total of 14 days and then imaging as outpatient. Patient will be discharged home with home health. Prescriptions written for Rocephin 2 g IV every 12 hours for 12 more days. Vancomycin 1500 mg IV every 12 hours for 12 more days. Dr. Ola Spurr once weekly labs CBC with differential, CMP, ESR and CRP and vancomycin troughs with labs to be faxed to [phone]. PICC line care by home health. 2. Gastroesophageal reflux disease without esophagitis on PPI 3. History of pancreatitis 4 elevated troponin was secondary to demand ischemia with infection 5. Impaired fasting glucose  DISCHARGE CONDITIONS:   Satisfactory  CONSULTS OBTAINED:  Treatment Team:  Loletha Grayer, MD Corey Skains, MD Adrian Prows, MD  DRUG ALLERGIES:   Allergies  Allergen Reactions  . Penicillins     Has patient had a PCN reaction causing immediate rash, facial/tongue/throat swelling, SOB or lightheadedness with hypotension: NO Has patient had a  PCN reaction causing severe rash involving mucus membranes or skin necrosis: YES Has patient had a PCN reaction that required hospitalization: YES Has patient had a PCN reaction occurring within the last 10 years: NO If all of the above answers are "NO", then may proceed with Cephalosporin use.     DISCHARGE MEDICATIONS:   Current Discharge Medication List    START taking these medications   Details  cefTRIAXone 2 g in dextrose 5 % 50 mL Inject 2 g into the vein every 12 (twelve) hours. Qty: 24 Dose, Refills: 0    oxyCODONE (OXY IR/ROXICODONE) 5 MG immediate release tablet Take 1 tablet (5 mg total) by mouth every 4 (four) hours as needed for moderate pain. Qty: 30 tablet, Refills: 0    vancomycin 1,500 mg in sodium chloride 0.9 % 500 mL Inject 1,500 mg into the vein every 12 (twelve) hours. Qty: 24 Dose, Refills: 0      CONTINUE these medications which have NOT CHANGED   Details  omeprazole (PRILOSEC) 20 MG capsule Take 20 mg by mouth daily.       STOP taking these medications     ibuprofen (ADVIL,MOTRIN) 200 MG tablet          DISCHARGE INSTRUCTIONS:   Follow-up with Dr. Ola Spurr on January 11 at 8:30 AM Home health PICC line care  If you experience worsening of your admission symptoms, develop shortness of breath, life threatening emergency, suicidal or homicidal thoughts you must seek medical attention immediately by calling 911 or calling your MD immediately  if symptoms less severe.  You Must read complete instructions/literature along with all the  possible adverse reactions/side effects for all the Medicines you take and that have been prescribed to you. Take any new Medicines after you have completely understood and accept all the possible adverse reactions/side effects.   Please note  You were cared for by a hospitalist during your hospital stay. If you have any questions about your discharge medications or the care you received while you were in the  hospital after you are discharged, you can call the unit and asked to speak with the hospitalist on call if the hospitalist that took care of you is not available. Once you are discharged, your primary care physician will handle any further medical issues. Please note that NO REFILLS for any discharge medications will be authorized once you are discharged, as it is imperative that you return to your primary care physician (or establish a relationship with a primary care physician if you do not have one) for your aftercare needs so that they can reassess your need for medications and monitor your lab values.    Today   CHIEF COMPLAINT:   Chief Complaint  Patient presents with  . Chest Pain    HISTORY OF PRESENT ILLNESS:  Karl Norman  is a 69 y.o. male presented with back pain and was found to have an infection on the spine   VITAL SIGNS:  Blood pressure 122/57, pulse 57, temperature 97.9 F (36.6 C), temperature source Oral, resp. rate 21, height '5\' 11"'  (1.803 m), weight 86.365 kg (190 lb 6.4 oz), SpO2 97 %.    PHYSICAL EXAMINATION:  GENERAL:  70 y.o.-year-old patient lying in the bed with no acute distress.  EYES: Pupils equal, round, reactive to light and accommodation. No scleral icterus. Extraocular muscles intact.  HEENT: Head atraumatic, normocephalic. Oropharynx and nasopharynx clear.  NECK:  Supple, no jugular venous distention. No thyroid enlargement, no tenderness.  LUNGS: Normal breath sounds bilaterally, no wheezing, rales,rhonchi or crepitation. No use of accessory muscles of respiration.  CARDIOVASCULAR: S1, S2 normal. No murmurs, rubs, or gallops.  ABDOMEN: Soft, non-tender, non-distended. Bowel sounds present. No organomegaly or mass.  EXTREMITIES: No pedal edema, cyanosis, or clubbing.  NEUROLOGIC: Cranial nerves II through XII are intact. Muscle strength 5/5 in all extremities. Sensation intact. Gait not checked.  PSYCHIATRIC: The patient is alert and oriented x 3.   SKIN: No obvious rash, lesion, or ulcer.   DATA REVIEW:   CBC  Recent Labs Lab 09/20/15 0420  WBC 15.6*  HGB 12.4*  HCT 39.2*  PLT 210    Chemistries   Recent Labs Lab 09/20/15 0420  NA 140  K 5.1  CL 110  CO2 25  GLUCOSE 157*  BUN 24*  CREATININE 0.83  CALCIUM 9.3  AST 24  ALT 24  ALKPHOS 177*  BILITOT 1.6*    Cardiac Enzymes  Recent Labs Lab 09/18/15 1810  TROPONINI 0.07*    Microbiology Results  Results for orders placed or performed during the hospital encounter of 09/18/15  CULTURE, BLOOD (ROUTINE X 2) w Reflex to PCR ID Panel     Status: None (Preliminary result)   Collection Time: 09/18/15 12:34 PM  Result Value Ref Range Status   Specimen Description BLOOD RIGHT ASSIST CONTROL  Final   Special Requests BOTTLES DRAWN AEROBIC AND ANAEROBIC [place]  Final   Culture NO GROWTH 1 DAY  Final   Report Status PENDING  Incomplete  CULTURE, BLOOD (ROUTINE X 2) w Reflex to PCR ID Panel     Status: None (Preliminary result)  Collection Time: 09/18/15 12:40 PM  Result Value Ref Range Status   Specimen Description BLOOD LEFT ASSIST CONTROL  Final   Special Requests BOTTLES DRAWN AEROBIC AND ANAEROBIC [place]  Final   Culture NO GROWTH 1 DAY  Final   Report Status PENDING  Incomplete    RADIOLOGY:  Mr Thoracic Spine W Wo Contrast  09/18/2015  ADDENDUM REPORT: 09/18/2015 17:24 ADDENDUM: Study discussed by telephone with Dr. Loletha Grayer on 09/18/2015 at 1709 hours. He confirmed no known malignancy in this patient. He advised also that the clinical exam raises the possibility of posterior thoracic cellulitis. We discussed empiric treatment for infection (IV antibiotics) and followup if no improvement. Electronically Signed   By: Genevie Ann M.D.   On: 09/18/2015 17:24  09/18/2015  CLINICAL DATA:  69 year old male with recent back pain, unable to bear weight on his lower extremities today. Leukocytosis. Initial encounter. EMR states history of  "pancreatitis" . EXAM: MRI THORACIC SPINE WITHOUT AND WITH CONTRAST TECHNIQUE: Multiplanar and multiecho pulse sequences of the thoracic spine were obtained without and with intravenous contrast. CONTRAST:  66m MULTIHANCE GADOBENATE DIMEGLUMINE 529 MG/ML IV SOLN COMPARISON:  Chest CTA 1004 hours today.  Chest CT 12/05/2010. FINDINGS: Limited sagittal imaging of the cervical spine reveals diffusely advanced cervical disc and endplate degeneration. 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 There is abnormal upper thoracic dural thickening and enhancement (series 11, image 8). This extends from the cervicothoracic junction to the lower T3 level. This appears most pronounced at the T1-T2 level (see series 12, image 6) where circumferential dural or epidural thickening and enhancement is noted. There does appear to be trace prevertebral fluid from the T1 to the T3 levels (series 6, images 8 and 9), and there is nonspecific abnormal paraspinal soft tissue thickening and edema at the same levels eccentric to the right (see series 7, image 8). However, there is no associated marrow edema. On the CT earlier today no osteolysis. Advanced chronic disc and endplate degeneration throughout these levels and also in the adjacent lower cervical spine. Subsequently there is thoracic spinal stenosis maximal at T1-T2 with up to mild spinal cord compression. No cord signal abnormality identified. Mild degenerative appearing anterolisthesis at T2-T3. Right greater than left upper thoracic facet arthropathy. Elsewhere normal thoracic vertebral height and alignment. No marrow edema or evidence of acute osseous abnormality. No thoracic spinal stenosis below the T2 level. No other abnormal paraspinal soft tissues. No other abnormal thoracic dural thickening. There is mildly increased leptomeningeal spinal cord vasculature at the T8-T9 level (series 11, image 7), but this is not evident on T2 weighted images and is felt to  be physiologic. The conus medullaris appears to be normal at T12-L1. Stable visualized thoracic and upper abdominal viscera. There is a small chronic right adrenal mass which appears stable to regressed since 12/02/2010. IMPRESSION: 1. Abnormal dural/epidural thickening and enhancement from the cervicothoracic junction to the T3 level, with surrounding paraspinal soft tissue edema, trace prevertebral fluid, and associated with spinal stenosis and cord compression at T1-T2, but no associated cord signal abnormality. 2. Top differential considerations include acute spinal infection (although absent marrow edema such that there is NO evidence of osteomyelitis) versus soft tissue neoplasm or metastasis (although no suspicious bone changes on CT to suggest bone metastasis). 3. Negative for age thoracic spine elsewhere. 4. Chronic right adrenal nodule appears stable to mildly regressed since 2012. Electronically Signed: By: HGenevie AnnM.D. On: 09/18/2015 16:47    Management plans discussed with the  patient, family and they are in agreement.  CODE STATUS:     Code Status Orders        Start     Ordered   09/18/15 1156  Full code   Continuous     09/18/15 1155    Advance Directive Documentation        Most Recent Value   Type of Advance Directive  Healthcare Power of Attorney, Living will   Pre-existing out of facility DNR order (yellow form or pink MOST form)     "MOST" Form in Place?        TOTAL TIME TAKING CARE OF THIS PATIENT: 45 minutes.    Loletha Grayer M.D on 09/20/2015 at 1:59 PM  Between 7am to 6pm - Pager - [phone]  After 6pm go to [email or web address] - password [email or web address] [facility]  [facility]  Office  [phone]  CC: Primary care physician; No primary care provider on file.

## 2015-09-20 NOTE — Care Management (Signed)
Plan for patient to discharge today with home health RN to administering IV antibiotics.  Patient provided with list of agencies.  Home health referral for nursing was made to [facility].  [place] from [place] was notified of referral.  Medications will be supplied by [facility].  Corene Cornea from [facility] will notify patient of out of cost expense for Medication.  Initiation of nursing services to be 09/21/15.  RNCM signing off

## 2015-09-20 NOTE — Progress Notes (Signed)
Pt to be discharged this pm. After having completed his evening doses of vancomycin and rocephin. periph iv d/c'd and tele removed. picc line placed earlier infused well and flushed easily. disch instructions given to pt and his wife to their understanding. disch via w.c. Accompanied by spouse.

## 2015-09-20 NOTE — Consult Note (Signed)
Karl Norman     Reason for Consult:Thoracic infection   Referring Physician: Karlton Lemon Date of Admission:  09/18/2015   Active Problems:   Cellulitis   HPI: Karl Norman is a 69 y.o. male history of chronic pancreatitis and gastroesophageal reflux Norman admitted with back pain and inability to stand as well as CP and SOB. He was found to have wbc 18 and celluluitis of back. MRI showed no osteo/discitis.  He denies any recent trauma to back, breaks in skin, urinary, gi, dental or skin infections.  Treated with vanco and ctx and has had remarkable clinical response with markedly decreased pain, no fevers, wbc decreasing and able to ambulate without pain.  Past Medical History  Diagnosis Date  . GERD (gastroesophageal reflux Norman)   . Pancreatitis   . CHF (congestive heart failure) [facility])    Past Surgical History  Procedure Laterality Date  . Replacement total knee     Social History  Substance Use Topics  . Smoking status: Former Research scientist (life sciences)  . Smokeless tobacco: None  . Alcohol Use: No   No family history on file.  Allergies:  Allergies  Allergen Reactions  . Penicillins     Has patient had a PCN reaction causing immediate rash, facial/tongue/throat swelling, SOB or lightheadedness with hypotension: NO Has patient had a PCN reaction causing severe rash involving mucus membranes or skin necrosis: YES Has patient had a PCN reaction that required hospitalization: YES Has patient had a PCN reaction occurring within the last 10 years: NO If all of the above answers are "NO", then may proceed with Cephalosporin use.     Current antibiotics: Antibiotics Given (last 72 hours)    Date/Time Action Medication Dose Rate   09/18/15 1747 Given   vancomycin (VANCOCIN) 1,250 mg in sodium chloride 0.9 % 250 mL IVPB 1,250 mg 166.7 mL/hr   09/18/15 2030 Given   cefTRIAXone (ROCEPHIN) 2 g in dextrose 5 % 50 mL IVPB 2 g 100 mL/hr   09/19/15 0530 Given   vancomycin  (VANCOCIN) 1,250 mg in sodium chloride 0.9 % 250 mL IVPB 1,250 mg 166.7 mL/hr   09/19/15 1028 Given   cefTRIAXone (ROCEPHIN) 2 g in dextrose 5 % 50 mL IVPB 2 g 100 mL/hr   09/19/15 2145 Given   cefTRIAXone (ROCEPHIN) 2 g in dextrose 5 % 50 mL IVPB 2 g 100 mL/hr   09/19/15 2245 Given   vancomycin (VANCOCIN) 1,250 mg in sodium chloride 0.9 % 250 mL IVPB 1,250 mg 166.7 mL/hr      MEDICATIONS: . cefTRIAXone (ROCEPHIN)  IV  2 g Intravenous Q12H  . dexamethasone  10 mg Intravenous Q12H  . enoxaparin (LOVENOX) injection  40 mg Subcutaneous Q24H  . Influenza vac split quadrivalent PF  0.5 mL Intramuscular Tomorrow-1000  . pantoprazole  40 mg Oral Daily  . vancomycin  1,250 mg Intravenous Q18H    Review of Systems - 11 systems reviewed and negative per HPI   OBJECTIVE: Temp:  [97.9 F (36.6 C)-98.2 F (36.8 C)] 97.9 F (36.6 C) (12/30 0520) Pulse Rate:  [58-65] 58 (12/30 0520) Resp:  [16-18] 16 (12/30 0520) BP: (124-131)/(60-74) 128/68 mmHg (12/30 0520) SpO2:  [92 %-97 %] 97 % (12/30 0520) Physical Exam  Constitutional: He is oriented to person, place, and time. He appears well-developed and well-nourished. No distress.  HENT: PERRLA eomi Mouth/Throat: Oropharynx is clear and moist. No oropharyngeal exudate. Moderate dentition Cardiovascular: Normal rate, regular rhythm and normal heart sounds.  Pulmonary/Chest: mild  exp wheeze Abdominal: Soft. Bowel sounds are normal. He exhibits no distension. There is no tenderness.  Lymphadenopathy:  He has no cervical adenopathy.  Neurological: He is alert and oriented to person, place, and time.  Skin: Skin is warm and dry. Over back he has mild induration but no skin lesions wounds. He does have a sebacous cyst.  Psychiatric: He has a normal mood and affect. His behavior is normal.     LABS: Results for orders placed or performed during the hospital encounter of 09/18/15 (from the past 48 hour(s))  Basic metabolic panel     Status:  Abnormal   Collection Time: 09/18/15  8:37 AM  Result Value Ref Range   Sodium 132 (L) 135 - 145 mmol/L   Potassium 3.9 3.5 - 5.1 mmol/L   Chloride 104 101 - 111 mmol/L   CO2 20 (L) 22 - 32 mmol/L   Glucose, Bld 137 (H) 65 - 99 mg/dL   BUN 16 6 - 20 mg/dL   Creatinine, Ser 0.93 0.61 - 1.24 mg/dL   Calcium 9.0 8.9 - 10.3 mg/dL   GFR calc non Af Amer >60 >60 mL/min   GFR calc Af Amer >60 >60 mL/min    Comment: (NOTE) The eGFR has been calculated using the CKD EPI equation. This calculation has not been validated in all clinical situations. eGFR's persistently <60 mL/min signify possible Chronic Kidney Norman.    Anion gap 8 5 - 15  CBC     Status: Abnormal   Collection Time: 09/18/15  8:37 AM  Result Value Ref Range   WBC 21.0 (H) 3.8 - 10.6 K/uL   RBC 4.85 4.40 - 5.90 MIL/uL   Hemoglobin 13.3 13.0 - 18.0 g/dL   HCT 40.9 40.0 - 52.0 %   MCV 84.4 80.0 - 100.0 fL   MCH 27.3 26.0 - 34.0 pg   MCHC 32.4 32.0 - 36.0 g/dL   RDW 14.1 11.5 - 14.5 %   Platelets 206 150 - 440 K/uL  Troponin I     Status: Abnormal   Collection Time: 09/18/15  8:37 AM  Result Value Ref Range   Troponin I 0.10 (H) <0.031 ng/mL    Comment: READ BACK AND VERIFIED WITH MONICA MOON 09/18/15 0938 SGD        PERSISTENTLY INCREASED TROPONIN VALUES IN THE RANGE OF 0.04-0.49 ng/mL CAN BE SEEN IN:       -UNSTABLE ANGINA       -CONGESTIVE HEART FAILURE       -MYOCARDITIS       -CHEST TRAUMA       -ARRYHTHMIAS       -LATE PRESENTING MYOCARDIAL INFARCTION       -COPD   CLINICAL FOLLOW-UP RECOMMENDED.   Lipase, blood     Status: None   Collection Time: 09/18/15  8:37 AM  Result Value Ref Range   Lipase 15 11 - 51 U/L  Hepatic function panel     Status: Abnormal   Collection Time: 09/18/15  8:37 AM  Result Value Ref Range   Total Protein 7.2 6.5 - 8.1 g/dL   Albumin 3.0 (L) 3.5 - 5.0 g/dL   AST 27 15 - 41 U/L   ALT 36 17 - 63 U/L   Alkaline Phosphatase 278 (H) 38 - 126 U/L   Total Bilirubin 3.9 (H)  0.3 - 1.2 mg/dL   Bilirubin, Direct 2.0 (H) 0.1 - 0.5 mg/dL   Indirect Bilirubin 1.9 (H) 0.3 - 0.9 mg/dL  Troponin I     Status: Abnormal   Collection Time: 09/18/15  8:37 AM  Result Value Ref Range   Troponin I 0.06 (H) <0.031 ng/mL    Comment: PREVIOUS RESULT CALLED ON 09/18/15'@0938'  BY SDG...HP        PERSISTENTLY INCREASED TROPONIN VALUES IN THE RANGE OF 0.04-0.49 ng/mL CAN BE SEEN IN:       -UNSTABLE ANGINA       -CONGESTIVE HEART FAILURE       -MYOCARDITIS       -CHEST TRAUMA       -ARRYHTHMIAS       -LATE PRESENTING MYOCARDIAL INFARCTION       -COPD   CLINICAL FOLLOW-UP RECOMMENDED.   Sedimentation rate     Status: Abnormal   Collection Time: 09/18/15  8:37 AM  Result Value Ref Range   Sed Rate 39 (H) 0 - 20 mm/hr  CULTURE, BLOOD (ROUTINE X 2) w Reflex to PCR ID Panel     Status: None (Preliminary result)   Collection Time: 09/18/15 12:34 PM  Result Value Ref Range   Specimen Description BLOOD RIGHT ASSIST CONTROL    Special Requests BOTTLES DRAWN AEROBIC AND ANAEROBIC [place]    Culture NO GROWTH 1 DAY    Report Status PENDING   CULTURE, BLOOD (ROUTINE X 2) w Reflex to PCR ID Panel     Status: None (Preliminary result)   Collection Time: 09/18/15 12:40 PM  Result Value Ref Range   Specimen Description BLOOD LEFT ASSIST CONTROL    Special Requests BOTTLES DRAWN AEROBIC AND ANAEROBIC [place]    Culture NO GROWTH 1 DAY    Report Status PENDING   Troponin I     Status: Abnormal   Collection Time: 09/18/15  6:10 PM  Result Value Ref Range   Troponin I 0.07 (H) <0.031 ng/mL    Comment: PREVIOUS RESULT CALLED AT 0938  09/18/15 BY SDG...SDR        PERSISTENTLY INCREASED TROPONIN VALUES IN THE RANGE OF 0.04-0.49 ng/mL CAN BE SEEN IN:       -UNSTABLE ANGINA       -CONGESTIVE HEART FAILURE       -MYOCARDITIS       -CHEST TRAUMA       -ARRYHTHMIAS       -LATE PRESENTING MYOCARDIAL INFARCTION       -COPD   CLINICAL FOLLOW-UP RECOMMENDED.   Basic  metabolic panel     Status: Abnormal   Collection Time: 09/19/15  5:23 AM  Result Value Ref Range   Sodium 134 (L) 135 - 145 mmol/L   Potassium 5.2 (H) 3.5 - 5.1 mmol/L   Chloride 106 101 - 111 mmol/L   CO2 24 22 - 32 mmol/L   Glucose, Bld 152 (H) 65 - 99 mg/dL   BUN 18 6 - 20 mg/dL   Creatinine, Ser 1.05 0.61 - 1.24 mg/dL   Calcium 8.9 8.9 - 10.3 mg/dL   GFR calc non Af Amer >60 >60 mL/min   GFR calc Af Amer >60 >60 mL/min    Comment: (NOTE) The eGFR has been calculated using the CKD EPI equation. This calculation has not been validated in all clinical situations. eGFR's persistently <60 mL/min signify possible Chronic Kidney Norman.    Anion gap 4 (L) 5 - 15  CBC     Status: Abnormal   Collection Time: 09/19/15  5:23 AM  Result Value Ref Range   WBC 18.0 (H) 3.8 - 10.6 K/uL  RBC 4.55 4.40 - 5.90 MIL/uL   Hemoglobin 12.9 (L) 13.0 - 18.0 g/dL   HCT 39.8 (L) 40.0 - 52.0 %   MCV 87.4 80.0 - 100.0 fL   MCH 28.3 26.0 - 34.0 pg   MCHC 32.4 32.0 - 36.0 g/dL   RDW 14.5 11.5 - 14.5 %   Platelets 190 150 - 440 K/uL  Basic metabolic panel     Status: Abnormal   Collection Time: 09/20/15  4:20 AM  Result Value Ref Range   Sodium 140 135 - 145 mmol/L   Potassium 5.1 3.5 - 5.1 mmol/L   Chloride 110 101 - 111 mmol/L   CO2 25 22 - 32 mmol/L   Glucose, Bld 157 (H) 65 - 99 mg/dL   BUN 24 (H) 6 - 20 mg/dL   Creatinine, Ser 0.83 0.61 - 1.24 mg/dL   Calcium 9.3 8.9 - 10.3 mg/dL   GFR calc non Af Amer >60 >60 mL/min   GFR calc Af Amer >60 >60 mL/min    Comment: (NOTE) The eGFR has been calculated using the CKD EPI equation. This calculation has not been validated in all clinical situations. eGFR's persistently <60 mL/min signify possible Chronic Kidney Norman.    Anion gap 5 5 - 15  CBC with Differential/Platelet     Status: Abnormal   Collection Time: 09/20/15  4:20 AM  Result Value Ref Range   WBC 15.6 (H) 3.8 - 10.6 K/uL   RBC 4.56 4.40 - 5.90 MIL/uL   Hemoglobin 12.4 (L)  13.0 - 18.0 g/dL   HCT 39.2 (L) 40.0 - 52.0 %   MCV 86.0 80.0 - 100.0 fL   MCH 27.2 26.0 - 34.0 pg   MCHC 31.7 (L) 32.0 - 36.0 g/dL   RDW 14.5 11.5 - 14.5 %   Platelets 210 150 - 440 K/uL   Neutrophils Relative % 88 %   Neutro Abs 13.7 (H) 1.4 - 6.5 K/uL   Lymphocytes Relative 8 %   Lymphs Abs 1.3 1.0 - 3.6 K/uL   Monocytes Relative 4 %   Monocytes Absolute 0.6 0.2 - 1.0 K/uL   Eosinophils Relative 0 %   Eosinophils Absolute 0.0 0 - 0.7 K/uL   Basophils Relative 0 %   Basophils Absolute 0.0 0 - 0.1 K/uL   No components found for: ESR, C REACTIVE PROTEIN MICRO: Recent Results (from the past 720 hour(s))  CULTURE, BLOOD (ROUTINE X 2) w Reflex to PCR ID Panel     Status: None (Preliminary result)   Collection Time: 09/18/15 12:34 PM  Result Value Ref Range Status   Specimen Description BLOOD RIGHT ASSIST CONTROL  Final   Special Requests BOTTLES DRAWN AEROBIC AND ANAEROBIC [place]  Final   Culture NO GROWTH 1 DAY  Final   Report Status PENDING  Incomplete  CULTURE, BLOOD (ROUTINE X 2) w Reflex to PCR ID Panel     Status: None (Preliminary result)   Collection Time: 09/18/15 12:40 PM  Result Value Ref Range Status   Specimen Description BLOOD LEFT ASSIST CONTROL  Final   Special Requests BOTTLES DRAWN AEROBIC AND ANAEROBIC [place]  Final   Culture NO GROWTH 1 DAY  Final   Report Status PENDING  Incomplete    IMAGING: Dg Chest 2 View  09/18/2015  CLINICAL DATA:  Chest pain and shortness of breath with exertion EXAM: CHEST  2 VIEW COMPARISON:  December 15, 2010 FINDINGS: There is slight bibasilar atelectasis. There is no edema or consolidation. The heart  size is upper normal with pulmonary vascularity within normal limits. No adenopathy. There is degenerative change in the thoracic spine and shoulders. IMPRESSION: Bibasilar atelectatic change.  No edema or consolidation. Electronically Signed   By: Lowella Grip III M.D.   On: 09/18/2015 08:59   Ct Angio Chest Pe  W/cm &/or Wo Cm  09/18/2015  CLINICAL DATA:  Chest pain and shortness of Breath EXAM: CT ANGIOGRAPHY CHEST WITH CONTRAST TECHNIQUE: Multidetector CT imaging of the chest was performed using the standard protocol during bolus administration of intravenous contrast. Multiplanar CT image reconstructions and MIPs were obtained to evaluate the vascular anatomy. CONTRAST:  145m OMNIPAQUE IOHEXOL 350 MG/ML SOLN COMPARISON:  Chest CT December 02, 2010; chest radiograph September 18, 2015 FINDINGS: There is no demonstrable pulmonary embolus. There is atherosclerotic calcification in the aorta. There is no thoracic aortic aneurysm or dissection. There is calcification in the proximal great vessels. The visualize great vessels otherwise appear normal. Note that the right and left common carotid arteries arise as a common trunk, an anatomic variant. There is patchy bibasilar atelectatic change. There are a few small bullae in the upper lobes. There is no edema or consolidation. Thyroid appears normal. There is no demonstrable thoracic adenopathy. There are multiple foci of coronary artery calcification. The pericardium is not thickened. There is left ventricular hypertrophy. In the visualized upper abdomen, there is chronic dilatation of the pancreatic duct, likely residua of chronic pancreatitis. There is a stable right adrenal adenoma measuring 2.4 x 1.3 cm. There is degenerative change in the thoracic spine. There are no blastic or lytic bone lesions. Review of the MIP images confirms the above findings. IMPRESSION: No demonstrable pulmonary embolus. Areas of atelectatic change. No edema or consolidation. No adenopathy. Areas of atherosclerotic calcification. Foci of coronary artery calcification noted. There is left ventricular hypertrophy. Evidence suggesting a degree of chronic pancreatitis, stable. Stable right adrenal adenoma. Electronically Signed   By: WLowella GripIII M.D.   On: 09/18/2015 10:27   Mr Thoracic  Spine W Wo Contrast  09/18/2015  ADDENDUM REPORT: 09/18/2015 17:24 ADDENDUM: Study discussed by telephone with Dr. RLoletha Grayeron 09/18/2015 at 1709 hours. He confirmed no known malignancy in this patient. He advised also that the clinical exam raises the possibility of posterior thoracic cellulitis. We discussed empiric treatment for infection (IV antibiotics) and followup if no improvement. Electronically Signed   By: HGenevie AnnM.D.   On: 09/18/2015 17:24  09/18/2015  CLINICAL DATA:  69year old male with recent back pain, unable to bear weight on his lower extremities today. Leukocytosis. Initial encounter. EMR states history of "pancreatitis" . EXAM: MRI THORACIC SPINE WITHOUT AND WITH CONTRAST TECHNIQUE: Multiplanar and multiecho pulse sequences of the thoracic spine were obtained without and with intravenous contrast. CONTRAST:  19mMULTIHANCE GADOBENATE DIMEGLUMINE 529 MG/ML IV SOLN COMPARISON:  Chest CTA 1004 hours today.  Chest CT 12/05/2010. FINDINGS: Limited sagittal imaging of the cervical spine reveals diffusely advanced cervical disc and endplate degeneration. 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 There is abnormal upper thoracic dural thickening and enhancement (series 11, image 8). This extends from the cervicothoracic junction to the lower T3 level. This appears most pronounced at the T1-T2 level (see series 12, image 6) where circumferential dural or epidural thickening and enhancement is noted. There does appear to be trace prevertebral fluid from the T1 to the T3 levels (series 6, images 8 and 9), and there is nonspecific abnormal paraspinal soft tissue thickening and edema at the  same levels eccentric to the right (see series 7, image 8). However, there is no associated marrow edema. On the CT earlier today no osteolysis. Advanced chronic disc and endplate degeneration throughout these levels and also in the adjacent lower cervical spine. 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 No cord signal abnormality identified. Mild degenerative appearing anterolisthesis at T2-T3. Right greater than left upper thoracic facet arthropathy. Elsewhere normal thoracic vertebral height and alignment. No marrow edema or evidence of acute osseous abnormality. No thoracic spinal stenosis below the T2 level. No other abnormal paraspinal soft tissues. No other abnormal thoracic dural thickening. There is mildly increased leptomeningeal spinal cord vasculature at the T8-T9 level (series 11, image 7), but this is not evident on T2 weighted images and is felt to be physiologic. The conus medullaris appears to be normal at T12-L1. Stable visualized thoracic and upper abdominal viscera. There is a small chronic right adrenal mass which appears stable to regressed since 12/02/2010. IMPRESSION: 1. Abnormal dural/epidural thickening and enhancement from the cervicothoracic junction to the T3 level, with surrounding paraspinal soft tissue edema, trace prevertebral fluid, and associated with spinal stenosis and cord compression at T1-T2, but no associated cord signal abnormality. 2. Top differential considerations include acute spinal infection (although absent marrow edema such that there is NO evidence of osteomyelitis) versus soft tissue neoplasm or metastasis (although no suspicious bone changes on CT to suggest bone metastasis). 3. Negative for age thoracic spine elsewhere. 4. Chronic right adrenal nodule appears stable to mildly regressed since 2012. Electronically Signed: By: Genevie Ann M.D. On: 09/18/2015 16:47   Assessment:   Karl Norman is a 69 y.o. male admitted with back cellulitis/ paraspinal infection of unknown etiology. MRI does not show discitis or osteomyelitis but did have some epidural and dural thickening.  Has had remarkable improvement with 2 days of Vanco and ctx.  ESR 39.  He wishes to be discharged home. His wife is  Therapist, sports and daughter is NP.  Recommendations Given possible dural involvement I would suggest continuing IV abx Place picc  IV ctx and vanco for 14 days FU me at end of therapy to decide if needs oral tail therapy. Will likely repeat MRI at that time Thank you very much for allowing me to participate in the care of this patient. Please call with questions.   Cheral Marker. Ola Spurr, MD

## 2015-09-20 NOTE — Progress Notes (Signed)
ANTIBIOTIC CONSULT NOTE -FOLLOW UP   Pharmacy Consult for Vancomycin  Indication: cellulitis  Allergies  Allergen Reactions  . Penicillins     Has patient had a PCN reaction causing immediate rash, facial/tongue/throat swelling, SOB or lightheadedness with hypotension: NO Has patient had a PCN reaction causing severe rash involving mucus membranes or skin necrosis: YES Has patient had a PCN reaction that required hospitalization: YES Has patient had a PCN reaction occurring within the last 10 years: NO If all of the above answers are "NO", then may proceed with Cephalosporin use.     Patient Measurements: Height: 5\' 11"  (180.3 cm) Weight: 190 lb 6.4 oz (86.365 kg) IBW/kg (Calculated) : 75.3 Adjusted Body Weight: 79.7 kg   Vital Signs: Temp: 97.9 F (36.6 C) (12/30 1208) Temp Source: Oral (12/30 0900) BP: 122/57 mmHg (12/30 1208) Pulse Rate: 57 (12/30 1208) Intake/Output from previous day: 12/29 0701 - 12/30 0700 In: 840 [P.O.:840] Out: 2000 [Urine:2000] Intake/Output from this shift: Total I/O In: -  Out: 900 [Urine:900]  Labs:  Recent Labs  09/18/15 0837 09/19/15 0523 09/20/15 0420  WBC 21.0* 18.0* 15.6*  HGB 13.3 12.9* 12.4*  PLT 206 190 210  CREATININE 0.93 1.05 0.83   Estimated Creatinine Clearance: 92 mL/min (by C-G formula based on Cr of 0.83).  Recent Labs  09/20/15 1156  VANCORANDOM 13     Microbiology: Recent Results (from the past 720 hour(s))  CULTURE, BLOOD (ROUTINE X 2) w Reflex to PCR ID Panel     Status: None (Preliminary result)   Collection Time: 09/18/15 12:34 PM  Result Value Ref Range Status   Specimen Description BLOOD RIGHT ASSIST CONTROL  Final   Special Requests BOTTLES DRAWN AEROBIC AND ANAEROBIC [place]  Final   Culture NO GROWTH 1 DAY  Final   Report Status PENDING  Incomplete  CULTURE, BLOOD (ROUTINE X 2) w Reflex to PCR ID Panel     Status: None (Preliminary result)   Collection Time: 09/18/15 12:40 PM  Result  Value Ref Range Status   Specimen Description BLOOD LEFT ASSIST CONTROL  Final   Special Requests BOTTLES DRAWN AEROBIC AND ANAEROBIC [place]  Final   Culture NO GROWTH 1 DAY  Final   Report Status PENDING  Incomplete    Medical History: Past Medical History  Diagnosis Date  . GERD (gastroesophageal reflux disease)   . Pancreatitis   . CHF (congestive heart failure) (HCC)     Medications:  Prescriptions prior to admission  Medication Sig Dispense Refill Last Dose  . ibuprofen (ADVIL,MOTRIN) 200 MG tablet Take 600 mg by mouth 2 (two) times daily.    09/18/2015 at Unknown time  . omeprazole (PRILOSEC) 20 MG capsule Take 20 mg by mouth daily.    09/18/2015 at Unknown time   Assessment: CrCl = 82.1 ml/min Ke = 0.07hr-1 T1/2 = 9.9 hrs Vd = 60.5 L   Goal of Therapy:  Vancomycin trough level 10-15 mcg/ml  Plan:  Expected duration 7 days with resolution of temperature and/or normalization of WBC   Vancomycin 1250 mg IV X 1 given on 12/28 @ 18:00. Vancomycin 1250 mg IV Q18H ordered to start 12/29 @ 5:00, ~ 11 hrs after 1st dose (stacked dosing). This pt will reach Css by 12/30 @ 18:00. Will draw 1st trough on 12/30 @ 16:30, which will be very close to Css.   12/30: MD [place] would like to discharge the patient but needed a more outpatient friendly regimen either q12 hours or q24 hours.  Order random VAncomycin level after 3 doses (~12 hours after most recent dose). Vancomycin level resulted @ 13 mcg/ml; therefore q12 hour dosing would be ok. Patient will need to receive Vancomycin as late as possible prior to the patient being discharged at 18:00. Will order Vancomycin 1500 mg IV q12 hours to start @ 1400 today.   Thaily Hackworth D 09/20/2015,1:22 PM

## 2015-09-20 NOTE — Care Management Important Message (Signed)
Important Message  Patient Details  Name: Karl Norman MRN: [id number] Date of Birth: 1945/11/26   Medicare Important Message Given:  Yes    [name] 09/20/2015, 10:01 AM

## 2015-09-21 LAB — HEMOGLOBIN A1C: Hgb A1c MFr Bld: 5.9 % (ref 4.0–6.0)

## 2015-09-22 DIAGNOSIS — M4639 Infection of intervertebral disc (pyogenic), multiple sites in spine: Secondary | ICD-10-CM

## 2015-09-22 HISTORY — DX: Infection of intervertebral disc (pyogenic), multiple sites in spine: M46.39

## 2015-09-24 LAB — CULTURE, BLOOD (ROUTINE X 2)
CULTURE: NO GROWTH
Culture: NO GROWTH

## 2015-10-02 ENCOUNTER — Other Ambulatory Visit: Payer: Self-pay | Admitting: Infectious Diseases

## 2015-10-02 ENCOUNTER — Other Ambulatory Visit: Payer: Self-pay | Admitting: Family Medicine

## 2015-10-02 DIAGNOSIS — M4624 Osteomyelitis of vertebra, thoracic region: Secondary | ICD-10-CM

## 2015-10-02 DIAGNOSIS — M869 Osteomyelitis, unspecified: Secondary | ICD-10-CM

## 2015-10-03 ENCOUNTER — Other Ambulatory Visit: Payer: Self-pay | Admitting: Family Medicine

## 2015-10-04 ENCOUNTER — Other Ambulatory Visit: Payer: Self-pay | Admitting: Family Medicine

## 2015-10-04 ENCOUNTER — Ambulatory Visit
Admission: RE | Admit: 2015-10-04 | Discharge: 2015-10-04 | Disposition: A | Discharge status: Home / Self Care | Payer: Medicare Other | Source: Ambulatory Visit | Attending: Infectious Diseases | Admitting: Infectious Diseases

## 2015-10-04 DIAGNOSIS — M4644 Discitis, unspecified, thoracic region: Secondary | ICD-10-CM | POA: Insufficient documentation

## 2015-10-04 DIAGNOSIS — I340 Nonrheumatic mitral (valve) insufficiency: Secondary | ICD-10-CM | POA: Diagnosis present

## 2015-10-04 DIAGNOSIS — R0609 Other forms of dyspnea: Secondary | ICD-10-CM | POA: Diagnosis present

## 2015-10-04 DIAGNOSIS — M869 Osteomyelitis, unspecified: Secondary | ICD-10-CM

## 2015-10-04 MED ORDER — GADOBENATE DIMEGLUMINE 529 MG/ML IV SOLN
20.0000 mL | Freq: Once | INTRAVENOUS | Status: AC | PRN
  Administered 2015-10-04: 18 mL via INTRAVENOUS

## 2015-10-07 ENCOUNTER — Other Ambulatory Visit: Payer: Self-pay | Admitting: Family Medicine

## 2015-10-11 ENCOUNTER — Other Ambulatory Visit: Payer: Self-pay | Admitting: Internal Medicine

## 2015-10-11 DIAGNOSIS — I340 Nonrheumatic mitral (valve) insufficiency: Secondary | ICD-10-CM | POA: Insufficient documentation

## 2015-10-11 DIAGNOSIS — I059 Rheumatic mitral valve disease, unspecified: Secondary | ICD-10-CM

## 2015-10-11 DIAGNOSIS — I6523 Occlusion and stenosis of bilateral carotid arteries: Secondary | ICD-10-CM | POA: Insufficient documentation

## 2015-10-17 ENCOUNTER — Other Ambulatory Visit: Payer: Self-pay | Admitting: Family Medicine

## 2015-10-18 ENCOUNTER — Other Ambulatory Visit: Payer: Self-pay | Admitting: Family Medicine

## 2015-11-11 ENCOUNTER — Ambulatory Visit
Admission: RE | Admit: 2015-11-11 | Discharge: 2015-11-11 | Disposition: A | Discharge status: Home / Self Care | Payer: Medicare Other | Source: Ambulatory Visit | Attending: Internal Medicine | Admitting: Internal Medicine

## 2015-11-11 ENCOUNTER — Encounter
Admission: RE | Disposition: A | Discharge status: Home / Self Care | Payer: Self-pay | Source: Ambulatory Visit | Attending: Internal Medicine

## 2015-11-11 DIAGNOSIS — I340 Nonrheumatic mitral (valve) insufficiency: Secondary | ICD-10-CM | POA: Diagnosis present

## 2015-11-11 DIAGNOSIS — I081 Rheumatic disorders of both mitral and tricuspid valves: Secondary | ICD-10-CM | POA: Diagnosis not present

## 2015-11-11 HISTORY — PX: TEE WITHOUT CARDIOVERSION: SHX5443

## 2015-11-11 SURGERY — ECHOCARDIOGRAM, TRANSESOPHAGEAL
Anesthesia: Moderate Sedation

## 2015-11-11 MED ORDER — BUTAMBEN-TETRACAINE-BENZOCAINE 2-2-14 % EX AERO
INHALATION_SPRAY | CUTANEOUS | Status: AC
  Filled 2015-11-11: qty 20

## 2015-11-11 MED ORDER — FENTANYL CITRATE (PF) 100 MCG/2ML IJ SOLN
INTRAMUSCULAR | Status: AC
  Filled 2015-11-11: qty 4

## 2015-11-11 MED ORDER — MIDAZOLAM HCL 5 MG/5ML IJ SOLN
INTRAMUSCULAR | Status: AC | PRN
  Administered 2015-11-11: 2 mg via INTRAVENOUS

## 2015-11-11 MED ORDER — MIDAZOLAM HCL 5 MG/5ML IJ SOLN
INTRAMUSCULAR | Status: AC
  Filled 2015-11-11: qty 10

## 2015-11-11 MED ORDER — SODIUM CHLORIDE 0.9 % IV SOLN
INTRAVENOUS | Status: DC
  Administered 2015-11-11 (×2): via INTRAVENOUS

## 2015-11-11 MED ORDER — FENTANYL CITRATE (PF) 100 MCG/2ML IJ SOLN
INTRAMUSCULAR | Status: AC | PRN
  Administered 2015-11-11 (×2): 50 ug via INTRAVENOUS

## 2015-11-11 MED ORDER — MIDAZOLAM HCL 2 MG/2ML IJ SOLN
INTRAMUSCULAR | Status: AC | PRN
  Administered 2015-11-11 (×2): 2 mg via INTRAVENOUS

## 2015-11-11 NOTE — Progress Notes (Signed)
Pt clinically stable post tee per Dr Nehemiah Massed, awake, alert and oriented, taking po's without difficulty,vss. Discharge instructions given with questions answered, Dr Nehemiah Massed speaking with patient and wife.

## 2015-11-11 NOTE — Discharge Instructions (Signed)
Transesophageal Echocardiogram °Transesophageal echocardiography (TEE) is a special type of test that produces images of the heart by using sound waves (echocardiogram). This type of echocardiography can obtain better images of the heart than standard echocardiography. TEE is done by passing a flexible tube down the esophagus. The heart is located in front of the esophagus. Because the heart and esophagus are close to one another, your health care provider can take very clear, detailed pictures of the heart via ultrasound waves. °TEE may be done: °· If your health care provider needs more information based on standard echocardiography findings. °· If you had a stroke. This might have happened because a clot formed in your heart. TEE can visualize different areas of the heart and check for clots. °· To check valve anatomy and function. °· To check for infection on the inside of your heart (endocarditis). °· To evaluate the dividing wall (septum) of the heart and presence of a hole that did not close after birth (patent foramen ovale or atrial septal defect). °· To help diagnose a tear in the wall of the aorta (aortic dissection). °· During cardiac valve surgery. This allows the surgeon to assess the valve repair before closing the chest. °· During a variety of other cardiac procedures to guide positioning of catheters. °· Sometimes before a cardioversion, which is a shock to convert heart rhythm back to normal. °LET YOUR HEALTH CARE PROVIDER KNOW ABOUT:  °· Any allergies you have. °· All medicines you are taking, including vitamins, herbs, eye drops, creams, and over-the-counter medicines. °· Previous problems you or members of your family have had with the use of anesthetics. °· Any blood disorders you have. °· Previous surgeries you have had. °· Medical conditions you have. °· Swallowing difficulties. °· An esophageal obstruction. °RISKS AND COMPLICATIONS  °Generally, TEE is a safe procedure. However, as with any  procedure, complications can occur. Possible complications include an esophageal tear (rupture). °BEFORE THE PROCEDURE  °· Do not eat or drink for 6 hours before the procedure or as directed by your health care provider. °· Arrange for someone to drive you home after the procedure. Do not drive yourself home. During the procedure, you will be given medicines that can continue to make you feel drowsy and can impair your reflexes. °· An IV access tube will be started in the arm. °PROCEDURE  °· A medicine to help you relax (sedative) will be given through the IV access tube. °· A medicine may be sprayed or gargled to numb the back of the throat. °· Your blood pressure, heart rate, and breathing (vital signs) will be monitored during the procedure. °· The TEE probe is a long, flexible tube. The tip of the probe is placed into the back of the mouth, and you will be asked to swallow. This helps to pass the tip of the probe into the esophagus. Once the tip of the probe is in the correct area, your health care provider can take pictures of the heart. °· TEE is usually not a painful procedure. You may feel the probe press against the back of the throat. The probe does not enter the trachea and does not affect your breathing. °AFTER THE PROCEDURE  °· You will be in bed, resting, until you have fully returned to consciousness. °· When you first awaken, your throat may feel slightly sore and will probably still feel numb. This will improve slowly over time. °· You will not be allowed to eat or drink until it   is clear that the numbness has improved. °· Once you have been able to drink, urinate, and sit on the edge of the bed without feeling sick to your stomach (nausea) or dizzy, you may be cleared to go home. °· You should have a friend or family member with you for the next 24 hours after your procedure. °  °This information is not intended to replace advice given to you by your health care provider. Make sure you discuss any  questions you have with your health care provider. °  °Document Released: 11/28/2002 Document Revised: 09/12/2013 Document Reviewed: 03/09/2013 °Elsevier Interactive Patient Education ©2016 [employer] ° °

## 2015-11-11 NOTE — Progress Notes (Signed)
*  PRELIMINARY RESULTS* Echocardiogram 2D Echocardiogram has been performed.  Karl Norman 11/11/2015, 9:06 AM

## 2015-11-12 ENCOUNTER — Encounter: Payer: Self-pay | Admitting: Internal Medicine

## 2015-11-14 ENCOUNTER — Other Ambulatory Visit: Payer: Self-pay | Admitting: Family Medicine

## 2016-03-10 DIAGNOSIS — I10 Essential (primary) hypertension: Secondary | ICD-10-CM | POA: Diagnosis present

## 2016-12-07 ENCOUNTER — Other Ambulatory Visit: Payer: Self-pay | Admitting: Student

## 2016-12-07 DIAGNOSIS — B1920 Unspecified viral hepatitis C without hepatic coma: Secondary | ICD-10-CM

## 2016-12-11 ENCOUNTER — Ambulatory Visit
Admission: RE | Admit: 2016-12-11 | Discharge: 2016-12-11 | Disposition: A | Discharge status: Home / Self Care | Payer: Medicare Other | Source: Ambulatory Visit | Attending: Student | Admitting: Student

## 2016-12-11 DIAGNOSIS — K838 Other specified diseases of biliary tract: Secondary | ICD-10-CM | POA: Insufficient documentation

## 2016-12-11 DIAGNOSIS — B1920 Unspecified viral hepatitis C without hepatic coma: Secondary | ICD-10-CM | POA: Insufficient documentation

## 2017-02-17 ENCOUNTER — Ambulatory Visit
Admission: RE | Admit: 2017-02-17 | Payer: Medicare Other | Source: Ambulatory Visit | Admitting: Unknown Physician Specialty

## 2017-02-17 ENCOUNTER — Encounter: Admission: RE | Payer: Self-pay | Source: Ambulatory Visit

## 2017-02-17 SURGERY — ESOPHAGOGASTRODUODENOSCOPY (EGD) WITH PROPOFOL
Anesthesia: General

## 2018-06-21 ENCOUNTER — Emergency Department: Payer: Medicare Other

## 2018-06-21 ENCOUNTER — Other Ambulatory Visit
Admission: RE | Admit: 2018-06-21 | Discharge: 2018-06-21 | Disposition: A | Discharge status: Home / Self Care | Payer: Medicare Other | Source: Ambulatory Visit | Attending: Student | Admitting: Student

## 2018-06-21 ENCOUNTER — Emergency Department
Admission: EM | Admit: 2018-06-21 | Discharge: 2018-06-22 | Disposition: A | Discharge status: Home / Self Care | Payer: Medicare Other | Attending: Emergency Medicine | Admitting: Emergency Medicine

## 2018-06-21 ENCOUNTER — Encounter: Payer: Self-pay | Admitting: Emergency Medicine

## 2018-06-21 ENCOUNTER — Other Ambulatory Visit: Payer: Self-pay

## 2018-06-21 DIAGNOSIS — I509 Heart failure, unspecified: Secondary | ICD-10-CM | POA: Insufficient documentation

## 2018-06-21 DIAGNOSIS — Z87891 Personal history of nicotine dependence: Secondary | ICD-10-CM | POA: Insufficient documentation

## 2018-06-21 DIAGNOSIS — K8050 Calculus of bile duct without cholangitis or cholecystitis without obstruction: Secondary | ICD-10-CM | POA: Diagnosis not present

## 2018-06-21 DIAGNOSIS — R197 Diarrhea, unspecified: Secondary | ICD-10-CM

## 2018-06-21 DIAGNOSIS — Z79899 Other long term (current) drug therapy: Secondary | ICD-10-CM | POA: Insufficient documentation

## 2018-06-21 LAB — URINALYSIS, COMPLETE (UACMP) WITH MICROSCOPIC
BACTERIA UA: NONE SEEN
GLUCOSE, UA: NEGATIVE mg/dL
HGB URINE DIPSTICK: NEGATIVE
Ketones, ur: NEGATIVE mg/dL
LEUKOCYTES UA: NEGATIVE
NITRITE: NEGATIVE
PROTEIN: 30 mg/dL — AB
Specific Gravity, Urine: 1.013 (ref 1.005–1.030)
pH: 6 (ref 5.0–8.0)

## 2018-06-21 LAB — GASTROINTESTINAL PANEL BY PCR, STOOL (REPLACES STOOL CULTURE)

## 2018-06-21 LAB — CBC
HEMATOCRIT: 36.8 % — AB (ref 40.0–52.0)
HEMOGLOBIN: 12.4 g/dL — AB (ref 13.0–18.0)
MCH: 28.1 pg (ref 26.0–34.0)
MCHC: 33.6 g/dL (ref 32.0–36.0)
MCV: 83.8 fL (ref 80.0–100.0)
Platelets: 257 10*3/uL (ref 150–440)
RBC: 4.39 MIL/uL — AB (ref 4.40–5.90)
RDW: 18.6 % — ABNORMAL HIGH (ref 11.5–14.5)
WBC: 10.8 10*3/uL — AB (ref 3.8–10.6)

## 2018-06-21 LAB — COMPREHENSIVE METABOLIC PANEL
ALT: 69 U/L — ABNORMAL HIGH (ref 0–44)
ANION GAP: 10 (ref 5–15)
AST: 90 U/L — ABNORMAL HIGH (ref 15–41)
Albumin: 3 g/dL — ABNORMAL LOW (ref 3.5–5.0)
Alkaline Phosphatase: 569 U/L — ABNORMAL HIGH (ref 38–126)
BILIRUBIN TOTAL: 9.8 mg/dL — AB (ref 0.3–1.2)
BUN: 15 mg/dL (ref 8–23)
CO2: 22 mmol/L (ref 22–32)
Calcium: 9.3 mg/dL (ref 8.9–10.3)
Chloride: 106 mmol/L (ref 98–111)
Creatinine, Ser: 0.78 mg/dL (ref 0.61–1.24)
Glucose, Bld: 131 mg/dL — ABNORMAL HIGH (ref 70–99)
POTASSIUM: 4.1 mmol/L (ref 3.5–5.1)
Sodium: 138 mmol/L (ref 135–145)
TOTAL PROTEIN: 7.3 g/dL (ref 6.5–8.1)

## 2018-06-21 LAB — C DIFFICILE QUICK SCREEN W PCR REFLEX
C DIFFICILE (CDIFF) INTERP: NOT DETECTED
C DIFFICILE (CDIFF) TOXIN: NEGATIVE
C Diff antigen: NEGATIVE

## 2018-06-21 LAB — TROPONIN I: Troponin I: <0.03 ng/mL (ref <0.03)

## 2018-06-21 LAB — LIPASE, BLOOD: LIPASE: 70 U/L — AB (ref 11–51)

## 2018-06-21 NOTE — ED Notes (Signed)
Pt found wandering through halls back towards room.  Pt agreeing to stay to be transferred to [facility] from [facility].

## 2018-06-21 NOTE — ED Triage Notes (Signed)
Pt was called by gastroenterologist and told to come to hospital because his lever enzyme were elevated. Pt denies any pain but states he has had diarrhea for the last 7 days.

## 2018-06-21 NOTE — ED Provider Notes (Signed)
[facility] Emergency Department Provider Note  ___________________________________________   First MD Initiated Contact with Patient 06/21/18 1612     (approximate)  I have reviewed the triage vital signs and the nursing notes.   HISTORY  Chief Complaint Abnormal Lab and Diarrhea   HPI Karl Norman is a 72 y.o. male with a history of CHF, pink otitis and biliary stricture who was presented to the emergency department today with approximately 8 days of diarrhea.  Denies any blood in the stool.  States that initially on 22 December he also had upper abdominal pain with nausea and vomiting.  Says that he is no longer having any pain nor any nausea or vomiting.  Michela Pitcher he was sent to the emergency department today because of finding of an elevated bilirubin on pre-colonoscopy screening labs.  Says that he has had a history of multiple pancreatic stents but does not have any place at this time.   Past Medical History:  Diagnosis Date  . CHF (congestive heart failure) ([place])   . GERD (gastroesophageal reflux disease)   . Pancreatitis     Patient Active Problem List   Diagnosis Date Noted  . Cellulitis 09/18/2015    Past Surgical History:  Procedure Laterality Date  . REPLACEMENT TOTAL KNEE    . TEE WITHOUT CARDIOVERSION N/A 11/11/2015   Procedure: TRANSESOPHAGEAL ECHOCARDIOGRAM (TEE);  Surgeon: Corey Skains, MD;  Location: [facility] ORS;  Service: Cardiovascular;  Laterality: N/A;    Prior to Admission medications   Medication Sig Start Date End Date Taking? Authorizing Provider  cefTRIAXone 2 g in dextrose 5 % 50 mL Inject 2 g into the vein every 12 (twelve) hours. Patient not taking: Reported on 11/11/2015 09/20/15   Loletha Grayer, MD  ciprofloxacin (CIPRO) 500 MG tablet Take 500 mg by mouth 2 (two) times daily.    [provider]  doxycycline (DORYX) 100 MG EC tablet Take 100 mg by mouth 2 (two) times daily.    [provider]    omeprazole (PRILOSEC) 20 MG capsule Take 20 mg by mouth daily.  01/21/15   [provider]  oxyCODONE (OXY IR/ROXICODONE) 5 MG immediate release tablet Take 1 tablet (5 mg total) by mouth every 4 (four) hours as needed for moderate pain. 09/20/15   Loletha Grayer, MD  vancomycin 1,500 mg in sodium chloride 0.9 % 500 mL Inject 1,500 mg into the vein every 12 (twelve) hours. Patient not taking: Reported on 11/11/2015 09/20/15   Loletha Grayer, MD    Allergies Penicillins  No family history on file.  Social History Social History   Tobacco Use  . Smoking status: Former Smoker  Substance Use Topics  . Alcohol use: No  . Drug use: Not on file    Review of Systems  Constitutional: No fever/chills Eyes: No visual changes. ENT: No sore throat. Cardiovascular: Denies chest pain. Respiratory: Denies shortness of breath. Gastrointestinal:  No constipation. Genitourinary: Negative for dysuria. Musculoskeletal: Negative for back pain. Skin: Negative for rash. Neurological: Negative for headaches, focal weakness or numbness.   ____________________________________________   PHYSICAL EXAM:  VITAL SIGNS: ED Triage Vitals  Enc Vitals Group     BP 06/21/18 1251 133/78     Pulse Rate 06/21/18 1251 71     Resp 06/21/18 1251 20     Temp 06/21/18 1251 98 F (36.7 C)     Temp Source 06/21/18 1251 Oral     SpO2 06/21/18 1251 98 %  Weight 06/21/18 1252 186 lb (84.4 kg)     Height 06/21/18 1252 5\' 11"  (1.803 m)     Head Circumference --      Peak Flow --      Pain Score 06/21/18 1251 0     Pain Loc --      Pain Edu? --      Excl. in [place]? --     Constitutional: Alert and oriented. in no acute distress. Eyes: Conjunctivae are jaundiced. Head: Atraumatic. Nose: No congestion/rhinnorhea. Mouth/Throat: Mucous membranes are moist.  Neck: No stridor.   Cardiovascular: Normal rate, regular rhythm. Grossly normal heart sounds.  Respiratory: Normal respiratory effort.  No  retractions. Lungs CTAB. Gastrointestinal: Soft and nontender. No distention.  Musculoskeletal: No lower extremity tenderness nor edema.  No joint effusions. Neurologic:  Normal speech and language. No gross focal neurologic deficits are appreciated. Skin:  Skin is warm, dry and intact. No rash noted. Psychiatric: Mood and affect are normal. Speech and behavior are normal.  ____________________________________________   LABS (all labs ordered are listed, but only abnormal results are displayed)  Labs Reviewed  LIPASE, BLOOD - Abnormal; Notable for the following components:      Result Value   Lipase 70 (*)    All other components within normal limits  COMPREHENSIVE METABOLIC PANEL - Abnormal; Notable for the following components:   Glucose, Bld 131 (*)    Albumin 3.0 (*)    AST 90 (*)    ALT 69 (*)    Alkaline Phosphatase 569 (*)    Total Bilirubin 9.8 (*)    All other components within normal limits  CBC - Abnormal; Notable for the following components:   WBC 10.8 (*)    RBC 4.39 (*)    Hemoglobin 12.4 (*)    HCT 36.8 (*)    RDW 18.6 (*)    All other components within normal limits  TROPONIN I  URINALYSIS, COMPLETE (UACMP) WITH MICROSCOPIC   ____________________________________________  EKG  ED ECG REPORT I, Doran Stabler, the attending physician, personally viewed and interpreted this ECG.   Date: 06/21/2018  EKG Time: 1259  Rate: 69  Rhythm: normal sinus rhythm  Axis: Normal  Intervals:right bundle branch block  ST&T Change: No ST segment elevation or depression.  No abnormal T wave inversion.  ____________________________________________  RADIOLOGY  Dilated gallbladder containing multiple shadowing stones but no additional sonographic findings for acute cholecystitis.  Intra-and extrahepatic biliary dilatation with common duct measuring up to 2 cm.  Increased compared to prior ultrasounds.  Multiple shadowing stones in the distal duct consistent with  choledocholithiasis. ____________________________________________   PROCEDURES  Procedure(s) performed:   Procedures  Critical Care performed:   ____________________________________________   INITIAL IMPRESSION / ASSESSMENT AND PLAN / ED COURSE  Pertinent labs & imaging results that were available during my care of the patient were reviewed by me and considered in my medical decision making (see chart for details).  Differential diagnosis includes, but is not limited to, biliary disease (biliary colic, acute cholecystitis, cholangitis, choledocholithiasis, etc), intrathoracic causes for epigastric abdominal pain including ACS, gastritis, duodenitis, pancreatitis, small bowel or large bowel obstruction, abdominal aortic aneurysm, hernia, and ulcer(s). As part of my medical decision making, I reviewed the following data within the electronic MEDICAL RECORD NUMBER Notes from prior outpatient visits.  ----------------------------------------- 9:02 PM on 06/21/2018 -----------------------------------------  Patient with choledocholithiasis.  Discussed case Dr. Bonna Gains who states that the patient is too complex to be taken care of here.  Patient plan to be transferred to [facility].  However, took several hours for the patient be accepted at [facility].  Accepted by Dr. Denice Paradise.  Family wanted to transfer the patient by private vehicle which I feel is fine because the patient is stable here in the emergency department.  Awaiting bed assignment and then the patient will be transferred with his family. ____________________________________________   FINAL CLINICAL IMPRESSION(S) / ED DIAGNOSES  Choledocholithiasis.  NEW MEDICATIONS STARTED DURING THIS VISIT:  New Prescriptions   No medications on file     Note:  This document was prepared using Dragon voice recognition software and may include unintentional dictation errors.     Orbie Pyo, MD 06/21/18 2102

## 2018-06-21 NOTE — ED Notes (Signed)
Patient transported to Ultrasound 

## 2018-06-22 LAB — COMPREHENSIVE METABOLIC PANEL
ALT: 70 U/L — AB (ref 0–44)
AST: 92 U/L — AB (ref 15–41)
Albumin: 2.9 g/dL — ABNORMAL LOW (ref 3.5–5.0)
Alkaline Phosphatase: 564 U/L — ABNORMAL HIGH (ref 38–126)
Anion gap: 8 (ref 5–15)
BUN: 12 mg/dL (ref 8–23)
CHLORIDE: 107 mmol/L (ref 98–111)
CO2: 23 mmol/L (ref 22–32)
CREATININE: 0.55 mg/dL — AB (ref 0.61–1.24)
Calcium: 9.5 mg/dL (ref 8.9–10.3)
Glucose, Bld: 113 mg/dL — ABNORMAL HIGH (ref 70–99)
POTASSIUM: 4.3 mmol/L (ref 3.5–5.1)
SODIUM: 138 mmol/L (ref 135–145)
Total Bilirubin: 9.8 mg/dL — ABNORMAL HIGH (ref 0.3–1.2)
Total Protein: 7.2 g/dL (ref 6.5–8.1)

## 2018-06-22 MED ORDER — OXYCODONE HCL 5 MG PO TABS: 5.0000 mg | ORAL_TABLET | Freq: Three times a day (TID) | ORAL | 0 refills | Status: AC | PRN

## 2018-06-22 MED ORDER — OXYCODONE HCL 5 MG PO TABS: 5.0000 mg | ORAL_TABLET | Freq: Once | ORAL | Status: DC

## 2018-06-22 MED ORDER — CIPROFLOXACIN HCL 500 MG PO TABS: 500.0000 mg | ORAL_TABLET | Freq: Two times a day (BID) | ORAL | 0 refills | Status: DC

## 2018-06-22 MED ORDER — CIPROFLOXACIN HCL 500 MG PO TABS: 500.0000 mg | ORAL_TABLET | Freq: Two times a day (BID) | ORAL | 0 refills | Status: AC

## 2018-06-22 MED ORDER — METRONIDAZOLE 500 MG PO TABS: 500.0000 mg | ORAL_TABLET | Freq: Three times a day (TID) | ORAL | 0 refills | Status: DC

## 2018-06-22 NOTE — ED Notes (Signed)
Pt given ice chips at this time. Per Dr. Jimmye Norman pt is to remain NPO but is able to drink a little bit. Pt visualized in NAD. Ambulatory without difficulty.

## 2018-06-22 NOTE — ED Provider Notes (Signed)
The patient has been here for over 24 hours and we have tried extensively to get him transferred to [facility] with no success.  [facility] is informed as they will not be able to provide a bed anytime soon.  [facility] does not currently have any beds.  We discussed with [facility] who will see him in the morning and perform an ERCP as an outpatient.  I repeated his CMP here and it did not reveal any interval change in his liver function tests.  This is not ideal but is the best possible outcome at this time given his situation.  I will prescribe Augmentin for him as well as some oxycodone.  He has nausea medicine to take at home.   Earleen Newport, MD 06/22/18 [phone]

## 2018-06-22 NOTE — ED Notes (Signed)
[facility]  Called  For  Transfer per  Dr Jimmye Norman

## 2018-06-22 NOTE — ED Notes (Signed)
Pt. Requesting something to drink, verbal approval given by Dr. Owens Shark.

## 2018-06-22 NOTE — ED Notes (Addendum)
Pt. Is irritated regarding the long wait to be transferred.  Patient is up out of bed, pacing in the doorway.  Patient states that he has been waiting 4 hours now.  Patient threatens to leave. Nurse de-esclated the situation, patient went back to bed.

## 2018-06-22 NOTE — ED Notes (Signed)
Pt given toothbrush and toothpaste per his request. Pt resting in bed. Pt states some irritation regarding wait to transfer to [facility]. This RN apologized delay, explained that pt would be transferred at first availability at [facility]. Pt states understanding at this time. This RN offered to get patient a recliner and seems to satisfy patient.

## 2018-06-22 NOTE — ED Notes (Signed)
NAD noted at time of D/C. Pt denies questions or concerns. Pt ambulatory to the lobby at this time.  

## 2018-06-22 NOTE — ED Notes (Signed)
This RN spoke with Charge RN regarding patient care. This RN escorted patient out and remained with patient while he was outside due to patient request of going outside for "fresh air". Pt ambulatory with no difficulty, pt back to room without incident. Will continue to monitor for further patient needs.

## 2018-06-22 NOTE — ED Notes (Signed)
Repeat CMP collected per MD order. Pt tolerated well. Pt continues to relax in bed with family at bedside. Will continue to monitor for further patient needs.

## 2018-06-22 NOTE — ED Notes (Signed)
Dr. Jimmye Norman at bedside at this time.

## 2018-06-22 NOTE — Progress Notes (Signed)
Patient with h/o pancreatitis, GERD, and CHF presenting with abnormal labs to [facility].  He presented with abdominal pain.  He has a dilated CBD, elevated LFTs.  They talked to GI there and he is too complicated due to h/o pancreatitis and prior stents placed to have ERCP done at [facility]. [facility] is on diversion and they would like to try to transfer him here since [facility] does not have available beds.  He is an appropriate Med Surg patient.    We do not currently have beds available but would be willing to accept the patient when there is an available bed, pending GI approval.  Based on this information, the [facility] physician prefers to look elsewhere for a bed at this time.   Carlyon Shadow, M.D.

## 2018-06-22 NOTE — ED Notes (Signed)
Pt resting in bed with family at bedside. This RN once again apologized for delay. Will continue to monitor for further patient needs. VSS at this time.

## 2018-06-22 NOTE — ED Notes (Signed)
Pt continues to rest in bed with family at bedside, lights remain dimmed for patient comfort. Per EDP awaiting callback from GI doctor. Pt remains alert and oriented, watching TV at this time. Will continue to monitor for further patient needs.

## 2018-06-22 NOTE — ED Notes (Signed)
Pt continues to rest in bed, NAD Noted at this time. Pt continues to have lights dimmed. Pt understands continuing to wait on [facility] for bed assignment.

## 2018-07-22 HISTORY — PX: OTHER SURGICAL HISTORY: SHX169

## 2018-08-15 ENCOUNTER — Encounter: Payer: Self-pay | Admitting: *Deleted

## 2018-08-15 ENCOUNTER — Ambulatory Visit: Payer: Medicare Other | Admitting: Certified Registered Nurse Anesthetist

## 2018-08-15 ENCOUNTER — Encounter
Admission: RE | Disposition: A | Discharge status: Home / Self Care | Payer: Self-pay | Source: Ambulatory Visit | Attending: Unknown Physician Specialty

## 2018-08-15 ENCOUNTER — Ambulatory Visit
Admission: RE | Admit: 2018-08-15 | Discharge: 2018-08-15 | Disposition: A | Discharge status: Home / Self Care | Payer: Medicare Other | Source: Ambulatory Visit | Attending: Unknown Physician Specialty | Admitting: Unknown Physician Specialty

## 2018-08-15 DIAGNOSIS — Z87891 Personal history of nicotine dependence: Secondary | ICD-10-CM | POA: Insufficient documentation

## 2018-08-15 DIAGNOSIS — Z1211 Encounter for screening for malignant neoplasm of colon: Secondary | ICD-10-CM | POA: Diagnosis not present

## 2018-08-15 DIAGNOSIS — Z79899 Other long term (current) drug therapy: Secondary | ICD-10-CM | POA: Diagnosis not present

## 2018-08-15 DIAGNOSIS — I509 Heart failure, unspecified: Secondary | ICD-10-CM | POA: Insufficient documentation

## 2018-08-15 DIAGNOSIS — K219 Gastro-esophageal reflux disease without esophagitis: Secondary | ICD-10-CM | POA: Diagnosis not present

## 2018-08-15 DIAGNOSIS — D123 Benign neoplasm of transverse colon: Secondary | ICD-10-CM | POA: Diagnosis not present

## 2018-08-15 DIAGNOSIS — J449 Chronic obstructive pulmonary disease, unspecified: Secondary | ICD-10-CM | POA: Insufficient documentation

## 2018-08-15 DIAGNOSIS — K621 Rectal polyp: Secondary | ICD-10-CM | POA: Diagnosis not present

## 2018-08-15 HISTORY — DX: Infection of intervertebral disc (pyogenic), multiple sites in spine: M46.39

## 2018-08-15 HISTORY — DX: Chronic obstructive pulmonary disease, unspecified: J44.9

## 2018-08-15 HISTORY — PX: COLONOSCOPY WITH PROPOFOL: SHX5780

## 2018-08-15 HISTORY — DX: Dyspnea, unspecified: R06.00

## 2018-08-15 SURGERY — COLONOSCOPY WITH PROPOFOL
Anesthesia: General

## 2018-08-15 MED ORDER — SODIUM CHLORIDE 0.9 % IV SOLN: INTRAVENOUS | Status: DC

## 2018-08-15 MED ORDER — LIDOCAINE HCL (CARDIAC) PF 100 MG/5ML IV SOSY
PREFILLED_SYRINGE | INTRAVENOUS | Status: DC | PRN
  Administered 2018-08-15: 50 mg via INTRAVENOUS

## 2018-08-15 MED ORDER — GENTAMICIN IN SALINE 1.6-0.9 MG/ML-% IV SOLN
80.0000 mg | Freq: Once | INTRAVENOUS | Status: AC
  Administered 2018-08-15: 80 mg via INTRAVENOUS
  Filled 2018-08-15: qty 50

## 2018-08-15 MED ORDER — PROPOFOL 10 MG/ML IV BOLUS
INTRAVENOUS | Status: DC | PRN
  Administered 2018-08-15: 50 mg via INTRAVENOUS

## 2018-08-15 MED ORDER — PROPOFOL 500 MG/50ML IV EMUL
INTRAVENOUS | Status: DC | PRN
  Administered 2018-08-15: 175 ug/kg/min via INTRAVENOUS

## 2018-08-15 MED ORDER — PROPOFOL 500 MG/50ML IV EMUL
INTRAVENOUS | Status: AC
  Filled 2018-08-15: qty 50

## 2018-08-15 MED ORDER — VANCOMYCIN HCL IN DEXTROSE 1-5 GM/200ML-% IV SOLN
INTRAVENOUS | Status: AC
  Filled 2018-08-15: qty 200

## 2018-08-15 MED ORDER — VANCOMYCIN HCL IN DEXTROSE 750-5 MG/150ML-% IV SOLN
750.0000 mg | Freq: Once | INTRAVENOUS | Status: AC
  Administered 2018-08-15: 750 mg via INTRAVENOUS
  Filled 2018-08-15: qty 150

## 2018-08-15 MED ORDER — SODIUM CHLORIDE 0.9 % IV SOLN
INTRAVENOUS | Status: DC
  Administered 2018-08-15: 09:00:00 via INTRAVENOUS

## 2018-08-15 NOTE — H&P (Signed)
Primary Care Physician:  Idelle Crouch, MD Primary Gastroenterologist:  Dr. Vira Agar  Pre-Procedure History & Physical: HPI:  Karl Norman is a 72 y.o. male is here for an colonoscopy.   Past Medical History:  Diagnosis Date  . CHF (congestive heart failure) ([place])    had three tubes to drain heart 2011  . COPD (chronic obstructive pulmonary disease) ([place])   . Dyspnea    with exertion  . GERD (gastroesophageal reflux disease)   . 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 Pancreatitis     Past Surgical History:  Procedure Laterality Date  . ERCP with stent  07/22/2018  . REPLACEMENT TOTAL KNEE    . TEE WITHOUT CARDIOVERSION N/A 11/11/2015   Procedure: TRANSESOPHAGEAL ECHOCARDIOGRAM (TEE);  Surgeon: Corey Skains, MD;  Location: [facility] ORS;  Service: Cardiovascular;  Laterality: N/A;    Prior to Admission medications   Medication Sig Start Date End Date Taking? Authorizing Provider  omeprazole (PRILOSEC) 20 MG capsule Take 20 mg by mouth daily.  01/21/15  Yes [provider]  cefTRIAXone 2 g in dextrose 5 % 50 mL Inject 2 g into the vein every 12 (twelve) hours. Patient not taking: Reported on 11/11/2015 09/20/15   Loletha Grayer, MD  ciprofloxacin (CIPRO) 500 MG tablet Take 500 mg by mouth 2 (two) times daily.    [provider]  doxycycline (DORYX) 100 MG EC tablet Take 100 mg by mouth 2 (two) times daily.    [provider]  metroNIDAZOLE (FLAGYL) 500 MG tablet Take 1 tablet (500 mg total) by mouth 3 (three) times daily. Patient not taking: Reported on 08/15/2018 06/22/18   Earleen Newport, MD  oxyCODONE (OXY IR/ROXICODONE) 5 MG immediate release tablet Take 1 tablet (5 mg total) by mouth every 4 (four) hours as needed for moderate pain. 09/20/15   Loletha Grayer, MD  oxyCODONE (ROXICODONE) 5 MG immediate release tablet Take 1 tablet (5 mg total) by mouth every 8 (eight) hours as needed. 06/22/18  06/22/19  Earleen Newport, MD  vancomycin 1,500 mg in sodium chloride 0.9 % 500 mL Inject 1,500 mg into the vein every 12 (twelve) hours. Patient not taking: Reported on 11/11/2015 09/20/15   Loletha Grayer, MD    Allergies as of 07/12/2018 - Review Complete 06/21/2018  Allergen Reaction Noted  . Penicillins  09/18/2015    History reviewed. No pertinent family history.  Social History   Socioeconomic History  . Marital status: Married    Spouse name: Not on file  . Number of children: Not on file  . Years of education: Not on file  . Highest education level: Not on file  Occupational History  . Not on file  Social Needs  . Financial resource strain: Not on file  . Food insecurity:    Worry: Not on file    Inability: Not on file  . Transportation needs:    Medical: Not on file    Non-medical: Not on file  Tobacco Use  . Smoking status: Former Research scientist (life sciences)  . Smokeless tobacco: Never Used  Substance and Sexual Activity  . Alcohol use: No  . Drug use: Not Currently  . Sexual activity: Not on file  Lifestyle  . Physical activity:    Days per week: Not on file    Minutes per session: Not on file  . Stress: Not on file  Relationships  . Social connections:    Talks on phone:  Not on file    Gets together: Not on file    Attends religious service: Not on file    Active member of club or organization: Not on file    Attends meetings of clubs or organizations: Not on file    Relationship status: Not on file  . Intimate partner violence:    Fear of current or ex partner: Not on file    Emotionally abused: Not on file    Physically abused: Not on file    Forced sexual activity: Not on file  Other Topics Concern  . Not on file  Social History Narrative  . Not on file    Review of Systems: See HPI, otherwise negative ROS  Physical Exam: BP (!) 113/54   Pulse 85   Temp (!) 97.2 F (36.2 C) (Tympanic)   Resp 18   Ht 5\' 11"  (1.803 m)   Wt 85.3 kg   SpO2 97%    BMI 26.22 kg/m  General:   Alert,  pleasant and cooperative in NAD Head:  Normocephalic and atraumatic. Neck:  Supple; no masses or thyromegaly. Lungs:  Clear throughout to auscultation.    Heart:  Regular rate and rhythm. Abdomen:  Soft, nontender and nondistended. Normal bowel sounds, without guarding, and without rebound.   Neurologic:  Alert and  oriented x4;  grossly normal neurologically.  Impression/Plan: [name] is here for an colonoscopy to be performed for heme positive stool.  Risks, benefits, limitations, and alternatives regarding  colonoscopy have been reviewed with the patient.  Questions have been answered.  All parties agreeable.   Gaylyn Cheers, MD  08/15/2018, 11:13 AM

## 2018-08-15 NOTE — Transfer of Care (Signed)
Immediate Anesthesia Transfer of Care Note  Patient: Karl Norman  Procedure(s) Performed: COLONOSCOPY WITH PROPOFOL (N/A )  Patient Location: PACU  Anesthesia Type:General  Level of Consciousness: awake, alert  and oriented  Airway & Oxygen Therapy: Patient Spontanous Breathing  Post-op Assessment: Report given to RN and Post -op Vital signs reviewed and stable  Post vital signs: Reviewed and stable  Last Vitals:  Vitals Value Taken Time  BP 113/54 08/15/2018 11:05 AM  Temp 36.2 C 08/15/2018 11:04 AM  Pulse 85 08/15/2018 11:06 AM  Resp 18 08/15/2018 11:06 AM  SpO2 97 % 08/15/2018 11:06 AM  Vitals shown include unvalidated device data.  Last Pain:  Vitals:   08/15/18 1104  TempSrc: Tympanic  PainSc: 0-No pain         Complications: No apparent anesthesia complications

## 2018-08-15 NOTE — Anesthesia Preprocedure Evaluation (Addendum)
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, PR TRIVIAL AR EF 50%   Neuro/Psych negative neurological ROS  negative psych ROS   GI/Hepatic Neg liver ROS, GERD  ,  Endo/Other  negative endocrine ROS  Renal/GU negative Renal ROS  negative genitourinary   Musculoskeletal  (+) Arthritis ,   Abdominal   Peds  Hematology negative hematology ROS (+)   Anesthesia Other Findings Past Medical History: No date: CHF (congestive heart failure) (HCC) No date: GERD (gastroesophageal reflux disease) 2017: 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 ([place]) No date: Pancreatitis  Past Surgical History: 07/22/2018: ERCP with stent No date: REPLACEMENT TOTAL KNEE 11/11/2015: TEE WITHOUT CARDIOVERSION; N/A     Comment:  Procedure: TRANSESOPHAGEAL ECHOCARDIOGRAM (TEE);                Surgeon: Corey Skains, MD;  Location: [facility] ORS;                Service: Cardiovascular;  Laterality: N/A;  BMI    Body Mass Index:  26.22 kg/m      Reproductive/Obstetrics negative OB ROS                           Anesthesia Physical Anesthesia Plan  ASA: III  Anesthesia Plan: General   Post-op Pain Management:    Induction:   PONV Risk Score and Plan: Propofol infusion and TIVA  Airway Management Planned: Natural Airway and  Nasal Cannula  Additional Equipment:   Intra-op Plan:   Post-operative Plan:   Informed Consent: I have reviewed the patients History and Physical, chart, labs and discussed the procedure including the risks, benefits and alternatives for the proposed anesthesia with the patient or authorized representative who has indicated his/her understanding and acceptance.   Dental Advisory Given  Plan Discussed with: Anesthesiologist  Anesthesia Plan Comments:         Anesthesia Quick Evaluation

## 2018-08-15 NOTE — Anesthesia Procedure Notes (Signed)
Date/Time: 08/15/2018 10:33 AM Performed by: Johnna Acosta, CRNA Pre-anesthesia Checklist: Patient identified, Emergency Drugs available, Suction available, Patient being monitored and Timeout performed Patient Re-evaluated:Patient Re-evaluated prior to induction Oxygen Delivery Method: Nasal cannula Preoxygenation: Pre-oxygenation with 100% oxygen Induction Type: IV induction

## 2018-08-15 NOTE — Anesthesia Post-op Follow-up Note (Signed)
Anesthesia QCDR form completed.        

## 2018-08-15 NOTE — Op Note (Signed)
[facility] Gastroenterology Patient Name: Karl Norman Procedure Date: 08/15/2018 9:21 AM MRN: [id number] Account #: [id number] Date of Birth: [birth year] Admit Type: Outpatient Age: 72 Room: [facility] ENDO ROOM 1 Gender: Male Note Status: Finalized Procedure:            Colonoscopy Indications:          Screening for colorectal malignant neoplasm Providers:            Manya Silvas, MD Referring MD:         Leonie Douglas. Doy Hutching, MD (Referring MD) Medicines:            Propofol per Anesthesia Complications:        No immediate complications. Procedure:            Pre-Anesthesia Assessment:                       - After reviewing the risks and benefits, the patient                        was deemed in satisfactory condition to undergo the                        procedure.                       After obtaining informed consent, the colonoscope was                        passed under direct vision. Throughout the procedure,                        the patient's blood pressure, pulse, and oxygen                        saturations were monitored continuously. The                        Colonoscope was introduced through the anus and                        advanced to the the cecum, identified by appendiceal                        orifice and ileocecal valve. The colonoscopy was                        performed without difficulty. The patient tolerated the                        procedure well. The quality of the bowel preparation                        was adequate to identify polyps. Findings:      A diminutive polyp was found in the transverse colon. The polyp was       sessile. The polyp was removed with a hot snare. Resection and retrieval       were complete.      Two sessile polyps were found in the transverse colon. The polyps were       small in size. These polyps were removed with a hot snare. Resection  and       retrieval were complete.      A diminutive polyp  was found in the rectum. The polyp was sessile. The       polyp was removed with a hot snare. Resection and retrieval were       complete.      A diminutive polyp was found in the rectum. The polyp was sessile. The       polyp was removed with a jumbo cold forceps. Resection and retrieval       were complete. Impression:           - One diminutive polyp in the transverse colon, removed                        with a hot snare. Resected and retrieved.                       - Two small polyps in the transverse colon, removed                        with a hot snare. Resected and retrieved.                       - One diminutive polyp in the rectum, removed with a                        hot snare. Resected and retrieved.                       - One diminutive polyp in the rectum, removed with a                        jumbo cold forceps. Resected and retrieved. Recommendation:       - Await pathology results. Manya Silvas, MD 08/15/2018 11:05:06 AM This report has been signed electronically. Number of Addenda: 0 Note Initiated On: 08/15/2018 9:21 AM Scope Withdrawal Time: 0 hours 12 minutes 29 seconds  Total Procedure Duration: 0 hours 23 minutes 40 seconds       [facility]

## 2018-08-16 ENCOUNTER — Encounter: Payer: Self-pay | Admitting: Unknown Physician Specialty

## 2018-08-16 LAB — SURGICAL PATHOLOGY

## 2018-08-16 NOTE — Anesthesia Postprocedure Evaluation (Signed)
Anesthesia Post Note  Patient: Karl Norman  Procedure(s) Performed: COLONOSCOPY WITH PROPOFOL (N/A )  Patient location during evaluation: PACU Anesthesia Type: General Level of consciousness: awake and alert Pain management: pain level controlled Vital Signs Assessment: post-procedure vital signs reviewed and stable Respiratory status: spontaneous breathing, nonlabored ventilation and respiratory function stable Cardiovascular status: blood pressure returned to baseline and stable Postop Assessment: no apparent nausea or vomiting Anesthetic complications: no     Last Vitals:  Vitals:   08/15/18 1114 08/15/18 1124  BP: (!) 152/80 (!) 152/70  Pulse:    Resp:    Temp:    SpO2:      Last Pain:  Vitals:   08/16/18 0727  TempSrc:   PainSc: 0-No pain                 Durenda Hurt

## 2018-11-08 ENCOUNTER — Other Ambulatory Visit (HOSPITAL_COMMUNITY): Payer: Self-pay | Admitting: Otolaryngology

## 2018-11-08 ENCOUNTER — Other Ambulatory Visit: Payer: Self-pay | Admitting: Otolaryngology

## 2018-11-08 DIAGNOSIS — R221 Localized swelling, mass and lump, neck: Secondary | ICD-10-CM

## 2018-11-14 ENCOUNTER — Ambulatory Visit
Admission: RE | Admit: 2018-11-14 | Discharge: 2018-11-14 | Disposition: A | Discharge status: Home / Self Care | Payer: Medicare Other | Source: Ambulatory Visit | Attending: Otolaryngology | Admitting: Otolaryngology

## 2018-11-14 DIAGNOSIS — R221 Localized swelling, mass and lump, neck: Secondary | ICD-10-CM | POA: Insufficient documentation

## 2018-11-15 ENCOUNTER — Other Ambulatory Visit: Payer: Self-pay | Admitting: Otolaryngology

## 2018-11-15 DIAGNOSIS — R221 Localized swelling, mass and lump, neck: Secondary | ICD-10-CM

## 2018-11-22 ENCOUNTER — Other Ambulatory Visit: Payer: Self-pay | Admitting: Student

## 2018-11-24 ENCOUNTER — Other Ambulatory Visit: Payer: Self-pay

## 2018-11-24 ENCOUNTER — Ambulatory Visit
Admission: RE | Admit: 2018-11-24 | Discharge: 2018-11-24 | Disposition: A | Discharge status: Home / Self Care | Payer: Medicare Other | Source: Ambulatory Visit | Attending: Otolaryngology | Admitting: Otolaryngology

## 2018-11-24 DIAGNOSIS — R221 Localized swelling, mass and lump, neck: Secondary | ICD-10-CM

## 2018-11-24 DIAGNOSIS — D119 Benign neoplasm of major salivary gland, unspecified: Secondary | ICD-10-CM | POA: Insufficient documentation

## 2018-11-24 MED ORDER — SODIUM CHLORIDE 0.9 % IV SOLN: INTRAVENOUS | Status: DC

## 2018-11-24 NOTE — Discharge Instructions (Signed)
Open Lymph Node Biopsy, Care After  This sheet gives you information about how to care for yourself after your procedure. Your health care provider may also give you more specific instructions. If you have problems or questions, contact your health care provider.  What can I expect after the procedure?  After the procedure, it is common to have:   Bruising.   Soreness.   Mild swelling.  Follow these instructions at home:  Medicines   Take over-the-counter and prescription medicines only as told by your health care provider.   If you were prescribed an antibiotic medicine, take it as told by your health care provider. Do not stop taking the antibiotic even if you start to feel better.  Incision care     Follow instructions from your health care provider about how to take care of your incision. Make sure you:  ? Wash your hands with soap and water before and after you change your bandage (dressing). If soap and water are not available, use hand sanitizer.  ? Change your dressing as told by your health care provider.  ? Leave stitches (sutures), skin glue, or adhesive strips in place. These skin closures may need to stay in place for 2 weeks or longer. If adhesive strip edges start to loosen and curl up, you may trim the loose edges. Do not remove adhesive strips completely unless your health care provider tells you to do that.   Check your incision area every day for signs of infection. Check for:  ? More redness, swelling, or pain.  ? Fluid or blood.  ? Warmth.  ? Pus or a bad smell.  Driving   Do not drive for 24 hours if you were given a sedative during your procedure.   Do not drive or use heavy machinery while taking prescription pain medicine.  General instructions   Return to your normal activities as told by your health care provider. Ask your health care provider what activities are safe for you.   Do not take baths, swim, or use a hot tub until your health care provider approves. Ask your health  care provider if you may take showers. You may only be allowed to take sponge baths.   Keep all follow-up visits as told by your health care provider. This is important.  Contact a health care provider if:   You have more redness, swelling, or pain around your incision.   You have fluid or blood coming from your incision.   Your incision feels warm to the touch.   You have pus or a bad smell coming from your incision.   You have a fever.   You have pain or numbness that gets worse or lasts longer than a few days.  Summary   After a lymph node biopsy, it is common to have bruising, soreness, and mild swelling.   Follow your health care provider's instructions about taking care of yourself at home. You will be told how to take medicines, take care of your incision, and check for infection.   Return to your normal activities as told by your health care provider. Ask your health care provider what activities are safe for you.   Contact a health care provider if you have more redness, swelling, or pain around your incision, you have a fever, or you have worsening pain or numbness.  This information is not intended to replace advice given to you by your health care provider. Make sure you discuss any questions   you have with your health care provider.  Document Released: 10/04/2015 Document Revised: 04/14/2018 Document Reviewed: 04/14/2018  Elsevier Interactive Patient Education  2019 Elsevier Inc.

## 2018-11-24 NOTE — Procedures (Signed)
Pre Procedure Dx: Left periauricular lymphadenopathy Post Procedural Dx: Same  Technically successful US guided biopsy of dominant left sided periauricular lymph node.   EBL: None  No immediate complications.   Ronny Bacon, MD Pager #: [phone]

## 2018-11-25 LAB — SURGICAL PATHOLOGY

## 2019-11-05 ENCOUNTER — Ambulatory Visit: Payer: Medicare Other | Attending: Internal Medicine

## 2019-11-05 DIAGNOSIS — Z23 Encounter for immunization: Secondary | ICD-10-CM | POA: Insufficient documentation

## 2019-11-05 NOTE — Progress Notes (Signed)
   Covid-19 Vaccination Clinic  Name:  Karl Norman    MRN: [id number] DOB: 11/06/1945  11/05/2019  Karl Norman was observed post Covid-19 immunization for 15 minutes without incidence. He was provided with Vaccine Information Sheet and instruction to access the V-Safe system.   Karl Norman was instructed to call 911 with any severe reactions post vaccine: Marland Kitchen Difficulty breathing  . Swelling of your face and throat  . A fast heartbeat  . A bad rash all over your body  . Dizziness and weakness    Immunizations Administered    Name Date Dose VIS Date Route   Pfizer COVID-19 Vaccine 11/05/2019 10:22 AM 0.3 mL 09/01/2019 Intramuscular   Manufacturer: [place]   Lot: [id number]   [place]: [id number]

## 2019-12-05 ENCOUNTER — Ambulatory Visit: Payer: Medicare Other | Attending: Internal Medicine

## 2019-12-05 DIAGNOSIS — Z23 Encounter for immunization: Secondary | ICD-10-CM

## 2019-12-05 NOTE — Progress Notes (Signed)
   Covid-19 Vaccination Clinic  Name:  Karl Norman    MRN: [id number] DOB: 06-17-1946  12/05/2019  Karl Norman was observed post Covid-19 immunization for 15 minutes without incident. He was provided with Vaccine Information Sheet and instruction to access the V-Safe system.   Karl Norman was instructed to call 911 with any severe reactions post vaccine: Marland Kitchen Difficulty breathing  . Swelling of face and throat  . A fast heartbeat  . A bad rash all over body  . Dizziness and weakness   Immunizations Administered    Name Date Dose VIS Date Route   Pfizer COVID-19 Vaccine 12/05/2019 11:59 AM 0.3 mL 09/01/2019 Intramuscular   Manufacturer: [place]   Lot: [id number]   [place]: [id number]

## 2020-10-21 ENCOUNTER — Emergency Department: Payer: Medicare Other

## 2020-10-21 ENCOUNTER — Observation Stay
Admit: 2020-10-21 | Discharge: 2020-10-21 | Disposition: A | Discharge status: Home / Self Care | Payer: Medicare Other | Attending: Internal Medicine | Admitting: Internal Medicine

## 2020-10-21 ENCOUNTER — Inpatient Hospital Stay
Admission: EM | Admit: 2020-10-21 | Discharge: 2020-10-25 | DRG: 291 | Disposition: A | Discharge status: Home / Self Care | Payer: Medicare Other | Attending: Family Medicine | Admitting: Family Medicine

## 2020-10-21 ENCOUNTER — Other Ambulatory Visit: Payer: Self-pay

## 2020-10-21 DIAGNOSIS — K219 Gastro-esophageal reflux disease without esophagitis: Secondary | ICD-10-CM | POA: Diagnosis present

## 2020-10-21 DIAGNOSIS — E785 Hyperlipidemia, unspecified: Secondary | ICD-10-CM | POA: Diagnosis present

## 2020-10-21 DIAGNOSIS — I50811 Acute right heart failure: Secondary | ICD-10-CM

## 2020-10-21 DIAGNOSIS — Z8679 Personal history of other diseases of the circulatory system: Secondary | ICD-10-CM

## 2020-10-21 DIAGNOSIS — Z20822 Contact with and (suspected) exposure to covid-19: Secondary | ICD-10-CM | POA: Diagnosis present

## 2020-10-21 DIAGNOSIS — Z79899 Other long term (current) drug therapy: Secondary | ICD-10-CM

## 2020-10-21 DIAGNOSIS — Q211 Atrial septal defect: Secondary | ICD-10-CM

## 2020-10-21 DIAGNOSIS — I4892 Unspecified atrial flutter: Secondary | ICD-10-CM | POA: Diagnosis present

## 2020-10-21 DIAGNOSIS — I700 Atherosclerosis of aorta: Secondary | ICD-10-CM | POA: Diagnosis present

## 2020-10-21 DIAGNOSIS — J449 Chronic obstructive pulmonary disease, unspecified: Secondary | ICD-10-CM | POA: Diagnosis present

## 2020-10-21 DIAGNOSIS — D649 Anemia, unspecified: Secondary | ICD-10-CM | POA: Diagnosis present

## 2020-10-21 DIAGNOSIS — Z87891 Personal history of nicotine dependence: Secondary | ICD-10-CM

## 2020-10-21 DIAGNOSIS — I110 Hypertensive heart disease with heart failure: Secondary | ICD-10-CM | POA: Diagnosis not present

## 2020-10-21 DIAGNOSIS — Z880 Allergy status to penicillin: Secondary | ICD-10-CM

## 2020-10-21 DIAGNOSIS — E875 Hyperkalemia: Secondary | ICD-10-CM | POA: Diagnosis present

## 2020-10-21 DIAGNOSIS — I5023 Acute on chronic systolic (congestive) heart failure: Secondary | ICD-10-CM | POA: Diagnosis present

## 2020-10-21 DIAGNOSIS — I509 Heart failure, unspecified: Secondary | ICD-10-CM | POA: Diagnosis not present

## 2020-10-21 DIAGNOSIS — E538 Deficiency of other specified B group vitamins: Secondary | ICD-10-CM | POA: Diagnosis present

## 2020-10-21 DIAGNOSIS — I4891 Unspecified atrial fibrillation: Secondary | ICD-10-CM | POA: Diagnosis present

## 2020-10-21 DIAGNOSIS — I4510 Unspecified right bundle-branch block: Secondary | ICD-10-CM | POA: Diagnosis present

## 2020-10-21 DIAGNOSIS — Z96659 Presence of unspecified artificial knee joint: Secondary | ICD-10-CM | POA: Diagnosis present

## 2020-10-21 DIAGNOSIS — R0902 Hypoxemia: Secondary | ICD-10-CM | POA: Diagnosis present

## 2020-10-21 LAB — BASIC METABOLIC PANEL
Anion gap: 9 (ref 5–15)
BUN: 15 mg/dL (ref 8–23)
CO2: 27 mmol/L (ref 22–32)
Calcium: 9.3 mg/dL (ref 8.9–10.3)
Chloride: 100 mmol/L (ref 98–111)
Creatinine, Ser: 1.2 mg/dL (ref 0.61–1.24)
GFR, Estimated: >60 mL/min (ref >60)
Glucose, Bld: 110 mg/dL — ABNORMAL HIGH (ref 70–99)
Potassium: 5.8 mmol/L — ABNORMAL HIGH (ref 3.5–5.1)
Sodium: 136 mmol/L (ref 135–145)

## 2020-10-21 LAB — CBC
HCT: 36.8 % — ABNORMAL LOW (ref 39.0–52.0)
Hemoglobin: 11.1 g/dL — ABNORMAL LOW (ref 13.0–17.0)
MCH: 24.7 pg — ABNORMAL LOW (ref 26.0–34.0)
MCHC: 30.2 g/dL (ref 30.0–36.0)
MCV: 82 fL (ref 80.0–100.0)
Platelets: 186 10*3/uL (ref 150–400)
RBC: 4.49 MIL/uL (ref 4.22–5.81)
RDW: 15.5 % (ref 11.5–15.5)
WBC: 5.7 10*3/uL (ref 4.0–10.5)
nRBC: 0 % (ref 0.0–0.2)

## 2020-10-21 LAB — TROPONIN I (HIGH SENSITIVITY)
Troponin I (High Sensitivity): 52 ng/L — ABNORMAL HIGH (ref <18)
Troponin I (High Sensitivity): 43 ng/L — ABNORMAL HIGH (ref <18)

## 2020-10-21 LAB — BRAIN NATRIURETIC PEPTIDE: B Natriuretic Peptide: 661.4 pg/mL — ABNORMAL HIGH (ref 0.0–100.0)

## 2020-10-21 MED ORDER — ENOXAPARIN SODIUM 40 MG/0.4ML ~~LOC~~ SOLN: 40.0000 mg | SUBCUTANEOUS | Status: DC

## 2020-10-21 MED ORDER — METOPROLOL TARTRATE 25 MG PO TABS
25.0000 mg | ORAL_TABLET | Freq: Three times a day (TID) | ORAL | Status: DC
  Administered 2020-10-21 (×2): 25 mg via ORAL
  Filled 2020-10-21 (×2): qty 1

## 2020-10-21 MED ORDER — ACETAMINOPHEN 325 MG PO TABS: 325.0000 mg | ORAL_TABLET | ORAL | Status: DC | PRN

## 2020-10-21 MED ORDER — ONDANSETRON HCL 4 MG/2ML IJ SOLN: 4.0000 mg | Freq: Four times a day (QID) | INTRAMUSCULAR | Status: DC | PRN

## 2020-10-21 MED ORDER — ACETAMINOPHEN 325 MG PO TABS
325.0000 mg | ORAL_TABLET | ORAL | Status: DC | PRN
  Administered 2020-10-24: 325 mg via ORAL
  Filled 2020-10-21: qty 1

## 2020-10-21 MED ORDER — FUROSEMIDE 10 MG/ML IJ SOLN
60.0000 mg | Freq: Once | INTRAMUSCULAR | Status: AC
  Administered 2020-10-21: 60 mg via INTRAVENOUS
  Filled 2020-10-21: qty 8

## 2020-10-21 MED ORDER — SODIUM CHLORIDE 0.9% FLUSH: 3.0000 mL | INTRAVENOUS | Status: DC | PRN

## 2020-10-21 MED ORDER — SODIUM CHLORIDE 0.9% FLUSH
3.0000 mL | Freq: Two times a day (BID) | INTRAVENOUS | Status: DC
  Administered 2020-10-21: 3 mL via INTRAVENOUS

## 2020-10-21 MED ORDER — METOPROLOL TARTRATE 5 MG/5ML IV SOLN
5.0000 mg | Freq: Once | INTRAVENOUS | Status: AC
  Administered 2020-10-21: 5 mg via INTRAVENOUS
  Filled 2020-10-21: qty 5

## 2020-10-21 MED ORDER — FUROSEMIDE 10 MG/ML IJ SOLN
40.0000 mg | Freq: Once | INTRAMUSCULAR | Status: AC
  Administered 2020-10-21: 40 mg via INTRAVENOUS
  Filled 2020-10-21: qty 4

## 2020-10-21 MED ORDER — ENOXAPARIN SODIUM 60 MG/0.6ML ~~LOC~~ SOLN
0.5000 mg/kg | SUBCUTANEOUS | Status: DC
  Administered 2020-10-21 – 2020-10-22 (×2): 47.5 mg via SUBCUTANEOUS
  Filled 2020-10-21 (×2): qty 0.6

## 2020-10-21 MED ORDER — SODIUM CHLORIDE 0.9% FLUSH
3.0000 mL | INTRAVENOUS | Status: DC | PRN
Start: 2020-10-21 — End: 2020-10-21

## 2020-10-21 MED ORDER — FUROSEMIDE 10 MG/ML IJ SOLN
60.0000 mg | Freq: Every day | INTRAMUSCULAR | Status: DC
  Administered 2020-10-22: 60 mg via INTRAVENOUS
  Filled 2020-10-21: qty 6

## 2020-10-21 MED ORDER — PANTOPRAZOLE SODIUM 40 MG PO TBEC
40.0000 mg | DELAYED_RELEASE_TABLET | Freq: Every day | ORAL | Status: DC
  Administered 2020-10-22 – 2020-10-25 (×4): 40 mg via ORAL
  Filled 2020-10-21 (×4): qty 1

## 2020-10-21 MED ORDER — SODIUM CHLORIDE 0.9% FLUSH: 3.0000 mL | Freq: Two times a day (BID) | INTRAVENOUS | Status: DC

## 2020-10-21 MED ORDER — VITAMIN B-12 1000 MCG PO TABS
1000.0000 ug | ORAL_TABLET | Freq: Every day | ORAL | Status: DC
  Administered 2020-10-22 – 2020-10-25 (×4): 1000 ug via ORAL
  Filled 2020-10-21 (×5): qty 1

## 2020-10-21 MED ORDER — ONDANSETRON HCL 4 MG/2ML IJ SOLN
4.0000 mg | Freq: Four times a day (QID) | INTRAMUSCULAR | Status: DC | PRN
Start: 2020-10-21 — End: 2020-10-21

## 2020-10-21 MED ORDER — SODIUM CHLORIDE 0.9 % IV SOLN: 250.0000 mL | INTRAVENOUS | Status: DC | PRN

## 2020-10-21 MED ORDER — SODIUM CHLORIDE 0.9 % IV SOLN
250.0000 mL | INTRAVENOUS | Status: DC | PRN
Start: 2020-10-21 — End: 2020-10-21

## 2020-10-21 NOTE — ED Triage Notes (Addendum)
SOB with exertion, bilateral lower leg swelling and unable to sleep unless sitting up. Pt with hx of CHF, started taking lasix X 10 days ago.   30lb weight gain over last 3 days. States progressing X 2 weeks.

## 2020-10-21 NOTE — Consult Note (Signed)
[facility] Card[facility] Consultation Note  Patient ID: Karl Norman, MRN: [id number], DOB/AGE: [birth year] 75 y.o. Admit date: 10/21/2020   Date of Consult: 10/21/2020 Primary Physician: Idelle Crouch, MD Primary Cardiologist: Nehemiah Massed  Chief Complaint:  Chief Complaint  Patient presents with  . Shortness of Breath  . Leg Swelling   Reason for Consult: Atrial flutter  HPI: 75 y.o. 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  He is had known hypertension hyperlipidemia.  The patient recently has had significant progression of lower extremity edema pulmonary edema shortness of breath PND and orthopnea.  When seen in the emergency room the patient had a chest x-ray showing congestive heart failure and bilateral pleural effusion with a BNP of 661.  Troponin was 52/43.  EKG showed atrial flutter with right bundle branch block and a heart rate of 130 bpm.  He was still short of breath but no evidence of chest pain or acute coronary syndrome.  The patient did have some Lasix which has helped him a little bit to improve some of his oxygenation.  Currently he is hemodynamically stable  Past Medical History:  Diagnosis Date  . CHF (congestive heart failure) ([place])    had three tubes to drain heart 2011  . COPD (chronic obstructive pulmonary disease) ([place])   . Dyspnea    with exertion  . GERD (gastroesophageal reflux disease)   . 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 Pancreatitis       Surgical History:  Past Surgical History:  Procedure Laterality Date  . COLONOSCOPY WITH PROPOFOL N/A 08/15/2018   Procedure: COLONOSCOPY WITH PROPOFOL;  Surgeon: Manya Silvas, MD;  Location: [facility] ENDOSCOPY;  Service: Endoscopy;  Laterality: N/A;  . ERCP with stent  07/22/2018  .  REPLACEMENT TOTAL KNEE    . TEE WITHOUT CARDIOVERSION N/A 11/11/2015   Procedure: TRANSESOPHAGEAL ECHOCARDIOGRAM (TEE);  Surgeon: Corey Skains, MD;  Location: [facility] ORS;  Service: Cardiovascular;  Laterality: N/A;     Home Meds: Prior to Admission medications   Medication Sig Start Date End Date Taking? Authorizing Provider  cefTRIAXone 2 g in dextrose 5 % 50 mL Inject 2 g into the vein every 12 (twelve) hours. 09/20/15   Loletha Grayer, MD  ciprofloxacin (CIPRO) 500 MG tablet Take 500 mg by mouth 2 (two) times daily.    [provider]  doxycycline (DORYX) 100 MG EC tablet Take 100 mg by mouth 2 (two) times daily.    [provider]  metroNIDAZOLE (FLAGYL) 500 MG tablet Take 1 tablet (500 mg total) by mouth 3 (three) times daily. Patient not taking: Reported on 08/15/2018 06/22/18   Earleen Newport, MD  omeprazole (PRILOSEC) 20 MG capsule Take 20 mg by mouth daily.  01/21/15   [provider]  oxyCODONE (OXY IR/ROXICODONE) 5 MG immediate release tablet Take 1 tablet (5 mg total) by mouth every 4 (four) hours as needed for moderate pain. 09/20/15   Loletha Grayer, MD  vancomycin 1,500 mg in sodium chloride 0.9 % 500 mL Inject 1,500 mg into the vein every 12 (twelve) hours. Patient not taking: Reported on 11/11/2015 09/20/15   Loletha Grayer, MD    Inpatient Medications:  . enoxaparin (LOVENOX) injection  0.5 mg/kg Subcutaneous Q24H  . sodium chloride flush  3 mL Intravenous Q12H   .  sodium chloride      Allergies:  Allergies  Allergen Reactions  . Penicillins     Has patient had a PCN reaction causing immediate rash, facial/tongue/throat swelling, SOB or lightheadedness with hypotension: NO Has patient had a PCN reaction causing severe rash involving mucus membranes or skin necrosis: YES Has patient had a PCN reaction that required hospitalization: YES Has patient had a PCN reaction occurring within the last 10 years: NO If all of the above  answers are "NO", then may proceed with Cephalosporin use.     Social History   Socioeconomic History  . Marital status: Married    Spouse name: Not on file  . Number of children: Not on file  . Years of education: Not on file  . Highest education level: Not on file  Occupational History  . Not on file  Tobacco Use  . Smoking status: Former Research scientist (life sciences)  . Smokeless tobacco: Never Used  Vaping Use  . Vaping Use: Never used  Substance and Sexual Activity  . Alcohol use: No  . Drug use: Not Currently  . Sexual activity: Not on file  Other Topics Concern  . Not on file  Social History Narrative  . Not on file   Social Determinants of Health   Financial Resource Strain: Not on file  Food Insecurity: Not on file  Transportation Needs: Not on file  Physical Activity: Not on file  Stress: Not on file  Social Connections: Not on file  Intimate Partner Violence: Not on file     No family history on file.   Review of Systems Positive for shortness of breath PND orthopnea Negative for: General:  chills, fever, night sweats or weight changes.  Cardiovascular: Positive for PND orthopnea negative for syncope dizziness  Dermatological skin lesions rashes Respiratory: Cough congestion Urologic: Frequent urination urination at night and hematuria Abdominal: negative for nausea, vomiting, diarrhea, bright red blood per rectum, melena, or hematemesis Neurologic: negative for visual changes, and/or hearing changes  All other systems reviewed and are otherwise negative except as noted above.  Labs: No results for input(s): CKTOTAL, CKMB, TROPONINI in the last 72 hours. Lab Results  Component Value Date   WBC 5.7 10/21/2020   HGB 11.1 (L) 10/21/2020   HCT 36.8 (L) 10/21/2020   MCV 82.0 10/21/2020   PLT 186 10/21/2020    Recent Labs  Lab 10/21/20 1331  NA 136  K 5.8*  CL 100  CO2 27  BUN 15  CREATININE 1.20  CALCIUM 9.3  GLUCOSE 110*   No results found for: CHOL, HDL,  LDLCALC, TRIG No results found for: DDIMER  Radiology/Studies:  DG Chest 2 View  Result Date: 10/21/2020 CLINICAL DATA:  Shortness of breath with bilateral lower leg swelling. EXAM: CHEST - 2 VIEW COMPARISON:  June 21, 2018. FINDINGS: Small bilateral pleural effusions with mild overlying bibasilar opacities. No visible pneumothorax. Enlarged cardiac silhouette with central pulmonary vascular congestion. Aortic atherosclerosis. No acute osseous abnormality. Degenerative changes of the spine. IMPRESSION: Findings suggestive of congestive heart failure with cardiomegaly, central pulmonary vascular congestion, and small bilateral pleural effusions. Overlying mild bibasilar opacities are favored to represent atelectasis. Electronically Signed   By: Margaretha Sheffield MD   On: 10/21/2020 14:00    EKG: Atrial flutter with rapid ventricular rate at 131 bpm and right bundle branch block  Weights: Filed Weights   10/21/20 1328  Weight: 96.2 kg     Physical Exam: Blood pressure 101/75, pulse (!) 128, temperature 98 F (  36.7 C), temperature source Oral, resp. rate (!) 25, height 5\' 9"  (1.753 m), weight 96.2 kg, SpO2 96 %. Body mass index is 31.31 kg/m. General: Well developed, well nourished, in no acute distress. Head eyes ears nose throat: Normocephalic, atraumatic, sclera non-icteric, no xanthomas, nares are without discharge. No apparent thyromegaly and/or mass  Lungs: Normal respiratory effort.  Some wheezes, basilar rales, n few rhonchi.  Heart: Irregular with normal S1 S2. no murmur gallop, no rub, PMI is normal size and placement, carotid upstroke normal without bruit, jugular venous pressure is normal Abdomen: Soft, non-tender, non-distended with normoactive bowel sounds. No hepatomegaly. No rebound/guarding. No obvious abdominal masses. Abdominal aorta is normal size without bruit Extremities: 2+ edema. no cyanosis, no clubbing, positive ulcers  Peripheral : 2+ bilateral upper extremity  pulses, 2+ bilateral femoral pulses, 0+ bilateral dorsal pedal pulse Neuro: Alert and oriented. No facial asymmetry. No focal deficit. Moves all extremities spontaneously. Musculoskeletal: Normal muscle tone without kyphosis Psych:  Responds to questions appropriately with a normal affect.    Assessment: 75 year old male with valvular heart disease hypertension hyperlipidemia with acute atrial fibrillation with rapid ventricular rate and congestive heart failure with pleural effusions and no current evidence of acute coronary syndrome  Plan: 1.  Begin metoprolol for heart rate control of atrial flutter with a goal heart rate between 60 and 90 bpm watching closely for hypotension 2.  Intravenous furosemide for lower extremity [place] pulmonary edema pleural effusions and acute systolic dysfunction congestive heart failure 3.  Anticoagulation with Lovenox for further risk reduction in stroke with atrial fibrillation atrial flutter and deep venous thrombosis prophylaxis 4.  Echocardiogram for LV systolic dysfunction reevaluation of valvular heart disease seen before 5.  Further adjustments of medication management after above  Signed, Corey Skains M.D. [facility] 10/21/2020, 5:52 PM

## 2020-10-21 NOTE — H&P (Signed)
History and Physical   Karl Norman Karl Norman:[id number] DOB: 1946/04/06 DOA: 10/21/2020  PCP: Idelle Crouch, MD  Outpatient Specialists: Dr. Nehemiah Massed, [facility] Cardiology Patient coming from: home  I have personally briefly reviewed patient's old medical records in [place].  Chief Concern: worsening shortness of breath   HPI: Karl Norman is a 75 y.o. male with medical history significant for hypertension, GERD presents to the emergency department for shortness of breath.  He endorses worsening shortness of breath x 14 days and swelling of the lower extermities for 12 days. He reports that he drinks about 1 gallon of fluid per day (coffee and water). He states he has shortness of breath that is worst with exertion.  He denies chest pain, vision changes, dysuria, abdominal pain, hematuria, diarrhea, nausea, vomiting.  He denies syncopal episodes and or loss of consciousness.    At bedside, patient's heart rate was in the 140s and 150s with SBP in the 120-130s and maintaining appropriate MAP.  He had bilateral 2+ pitting edema and using accessory muscles in order to breathe.  Stat BiPAP mask was ordered.  Lasix 60 mg IV stat once ordered.  RN at bedside aware of Lasix order.  Social history: lives with spouse. He denies tobacco (quit 18 years, less than 1 pack per day), etoh, recreational drug use.   ROS: Constitutional: + weight change, no fever ENT/Mouth: no sore throat, no rhinorrhea Eyes: no eye pain, no vision changes Cardiovascular: no chest pain, + dyspnea,  no edema, no palpitations Respiratory: no cough, no sputum, no wheezing Gastrointestinal: no nausea, no vomiting, no diarrhea, no constipation Genitourinary: no urinary incontinence, no dysuria, no hematuria Musculoskeletal: no arthralgias, no myalgias Skin: no skin lesions, no pruritus, Neuro: + weakness, no loss of consciousness, no syncope Psych: no anxiety, no depression, + decrease appetite Heme/Lymph: no bruising, no  bleeding  ED Course: Discussed with ED provider, patient requiring hospitalization due to suspected heart failure exacerbation.  Assessment/Plan  Active Problems:   Heart failure (HCC)   Heart failure exacerbation-suspect new diagnosis -Complete echo ordered -Cardiology, Dr. Nehemiah Massed has been consulted -Strict I's and O's -Status post 40 mg IV Lasix per ED provider with minimal urine output -One-time dose 60 mg IV Lasix -Output recorded at 1200 mL -Checks x-ray showed bilateral pulmonary vascular congestion and CHF, stat BiPAP ordered -Elevated BNP on presentation: 661 -Cardiac monitoring and admit to progressive cardiac unit -BMP in the a.m.  GERD-resumed PPI  B12 deficiency-resumed home B12  As needed medications: Ondansetron and acetaminophen  Chart reviewed.   DVT prophylaxis: Enoxaparin Code Status: full code  Diet: Heart healthy, n.p.o. at midnight Family Communication: Updated spouse at bedside Disposition Plan: Pending clinical course Consults called: Cardiology Admission status: Observation to progressive cardiac with telemetry  Past Medical History:  Diagnosis Date  . CHF (congestive heart failure) ([place])    had three tubes to drain heart 2011  . COPD (chronic obstructive pulmonary disease) ([place])   . Dyspnea    with exertion  . GERD (gastroesophageal reflux disease)   . 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 Pancreatitis    Past Surgical History:  Procedure Laterality Date  . COLONOSCOPY WITH PROPOFOL N/A 08/15/2018   Procedure: COLONOSCOPY WITH PROPOFOL;  Surgeon: Manya Silvas, MD;  Location: [facility] ENDOSCOPY;  Service: Endoscopy;  Laterality: N/A;  . ERCP with stent  07/22/2018  . REPLACEMENT TOTAL KNEE    . TEE WITHOUT CARDIOVERSION N/A  11/11/2015   Procedure: TRANSESOPHAGEAL ECHOCARDIOGRAM (TEE);  Surgeon: Corey Skains, MD;  Location: [facility] ORS;  Service: Cardiovascular;  Laterality: N/A;   Social  History:  reports that he has quit smoking. He has never used smokeless tobacco. He reports previous drug use. He reports that he does not drink alcohol.  Allergies  Allergen Reactions  . Penicillins     Has patient had a PCN reaction causing immediate rash, facial/tongue/throat swelling, SOB or lightheadedness with hypotension: NO Has patient had a PCN reaction causing severe rash involving mucus membranes or skin necrosis: YES Has patient had a PCN reaction that required hospitalization: YES Has patient had a PCN reaction occurring within the last 10 years: NO If all of the above answers are "NO", then may proceed with Cephalosporin use.    No family history on file. Family history: Family history reviewed and not pertinent  Prior to Admission medications   Medication Sig Start Date End Date Taking? Authorizing Provider  cefTRIAXone 2 g in dextrose 5 % 50 mL Inject 2 g into the vein every 12 (twelve) hours. 09/20/15   Loletha Grayer, MD  ciprofloxacin (CIPRO) 500 MG tablet Take 500 mg by mouth 2 (two) times daily.    [provider]  doxycycline (DORYX) 100 MG EC tablet Take 100 mg by mouth 2 (two) times daily.    [provider]  metroNIDAZOLE (FLAGYL) 500 MG tablet Take 1 tablet (500 mg total) by mouth 3 (three) times daily. Patient not taking: Reported on 08/15/2018 06/22/18   Earleen Newport, MD  omeprazole (PRILOSEC) 20 MG capsule Take 20 mg by mouth daily.  01/21/15   [provider]  oxyCODONE (OXY IR/ROXICODONE) 5 MG immediate release tablet Take 1 tablet (5 mg total) by mouth every 4 (four) hours as needed for moderate pain. 09/20/15   Loletha Grayer, MD  vancomycin 1,500 mg in sodium chloride 0.9 % 500 mL Inject 1,500 mg into the vein every 12 (twelve) hours. Patient not taking: Reported on 11/11/2015 09/20/15   Loletha Grayer, MD   Physical Exam: Vitals:   10/21/20 1324 10/21/20 1328 10/21/20 1338 10/21/20 1530  BP: 121/74   129/89  Pulse:  (!) 118  (!) 145 (!) 135  Resp: 20   19  Temp: 98 F (36.7 C)     TempSrc: Oral     SpO2: 93%   98%  Weight:  96.2 kg    Height:  5\' 9"  (1.753 m)     Constitutional: appears age-appropriate, NAD, calm, comfortable Eyes: PERRL, lids and conjunctivae normal ENMT: Mucous membranes are moist. Posterior pharynx clear of any exudate or lesions. Age-appropriate dentition. Hearing appropriate Neck: normal, supple, no masses, no thyromegaly Respiratory: Decreased breath sounds with mild bilateral crackles present on auscultation.  Increased respiratory effort.  Moderate accessory muscle use.  Cardiovascular: Regular rate and rhythm, no murmurs / rubs / gallops.  2+ pitting edema bilaterally.  2+ pedal pulses. No carotid bruits.  Abdomen: no tenderness, no masses palpated, no hepatosplenomegaly. Bowel sounds positive.  Musculoskeletal: no clubbing / cyanosis. No joint deformity upper and lower extremities. Good ROM, no contractures, no atrophy. Normal muscle tone.  Skin: no rashes, lesions, ulcers. No induration Neurologic: Sensation intact. Strength 5/5 in all 4.  Psychiatric: Normal judgment and insight. Alert and oriented x 3. Normal mood.   EKG: independently reviewed, showing atrial flutter with heart rate of 145, QTC 518  Chest x-ray on Admission: I personally reviewed and I agree with radiologist reading  as below.  DG Chest 2 View  Result Date: 10/21/2020 CLINICAL DATA:  Shortness of breath with bilateral lower leg swelling. EXAM: CHEST - 2 VIEW COMPARISON:  June 21, 2018. FINDINGS: Small bilateral pleural effusions with mild overlying bibasilar opacities. No visible pneumothorax. Enlarged cardiac silhouette with central pulmonary vascular congestion. Aortic atherosclerosis. No acute osseous abnormality. Degenerative changes of the spine. IMPRESSION: Findings suggestive of congestive heart failure with cardiomegaly, central pulmonary vascular congestion, and small bilateral pleural  effusions. Overlying mild bibasilar opacities are favored to represent atelectasis. Electronically Signed   By: Margaretha Sheffield MD   On: 10/21/2020 14:00   Labs on Admission: I have personally reviewed following labs  CBC: Recent Labs  Lab 10/21/20 1331  WBC 5.7  HGB 11.1*  HCT 36.8*  MCV 82.0  PLT 99991111   Basic Metabolic Panel: Recent Labs  Lab 10/21/20 1331  NA 136  K 5.8*  CL 100  CO2 27  GLUCOSE 110*  BUN 15  CREATININE 1.20  CALCIUM 9.3   GFR: Estimated Creatinine Clearance: 63.7 mL/min (by C-G formula based on SCr of 1.2 mg/dL).  Urine analysis:    Component Value Date/Time   COLORURINE AMBER (A) 06/21/2018 2222   APPEARANCEUR CLEAR (A) 06/21/2018 2222   LABSPEC 1.013 06/21/2018 2222   PHURINE 6.0 06/21/2018 2222   GLUCOSEU NEGATIVE 06/21/2018 2222   HGBUR NEGATIVE 06/21/2018 2222   BILIRUBINUR MODERATE (A) 06/21/2018 2222   KETONESUR NEGATIVE 06/21/2018 2222   PROTEINUR 30 (A) 06/21/2018 2222   NITRITE NEGATIVE 06/21/2018 2222   LEUKOCYTESUR NEGATIVE 06/21/2018 2222   CRITICAL CARE Performed by: Briant Cedar Zebediah Beezley  Total critical care time: 35 minutes  Critical care time was exclusive of separately billable procedures and treating other patients.  Critical care was necessary to treat or prevent imminent or life-threatening deterioration. Acute decompensated heart failure  Critical care was time spent personally by me on the following activities: development of treatment plan with patient and/or surrogate as well as nursing, discussions with consultants, evaluation of patient's response to treatment, examination of patient, obtaining history from patient or surrogate, ordering and performing treatments and interventions, ordering and review of laboratory studies, ordering and review of radiographic studies, pulse oximetry and re-evaluation of patient's condition.  Marvens Hollars N Charlesetta Milliron D.O. [facility]  If 7PM-7AM, please contact overnight-coverage provider If  7AM-7PM, please contact day coverage provider [email or web address]  10/21/2020, 4:05 PM

## 2020-10-21 NOTE — Progress Notes (Signed)
*  PRELIMINARY RESULTS* Echocardiogram 2D Echocardiogram has been performed.  [name] Karl Norman 10/21/2020, 7:48 PM

## 2020-10-21 NOTE — ED Notes (Signed)
Pt stated 30lbs weight gain in 2-days. Has been compliant with meds

## 2020-10-21 NOTE — ED Provider Notes (Signed)
[facility] Emergency Department Provider Note  ____________________________________________   Event Date/Time   First MD Initiated Contact with Patient 10/21/20 1457     (approximate)  I have reviewed the triage vital signs and the nursing notes.   HISTORY  Chief Complaint Shortness of Breath and Leg Swelling    HPI Karl Norman is a 75 y.o. male with COPD, CHF who comes in for shortness of breath and leg swelling.  Patient reports having shortness of breath with exertion, bilateral lower leg swelling and unable to sleep listening up.  Shortness of breath severe, constant, worse with laying flat, slightly improved with the Lasix.  Thinks he has been taking 20 mg daily.  Patient started taking Lasix 10 days ago but symptoms have been progressing over the past 2 weeks.  Patient states that his Lasix was left over from prior CHF.  He states he has gained 30 pounds over the past 3 days.          Past Medical History:  Diagnosis Date  . CHF (congestive heart failure) ([place])    had three tubes to drain heart 2011  . COPD (chronic obstructive pulmonary disease) ([place])   . Dyspnea    with exertion  . GERD (gastroesophageal reflux disease)   . 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 Pancreatitis     Patient Active Problem List   Diagnosis Date Noted  . Cellulitis 09/18/2015    Past Surgical History:  Procedure Laterality Date  . COLONOSCOPY WITH PROPOFOL N/A 08/15/2018   Procedure: COLONOSCOPY WITH PROPOFOL;  Surgeon: Manya Silvas, MD;  Location: [facility] ENDOSCOPY;  Service: Endoscopy;  Laterality: N/A;  . ERCP with stent  07/22/2018  . REPLACEMENT TOTAL KNEE    . TEE WITHOUT CARDIOVERSION N/A 11/11/2015   Procedure: TRANSESOPHAGEAL ECHOCARDIOGRAM (TEE);  Surgeon: Corey Skains, MD;  Location: [facility] ORS;  Service: Cardiovascular;  Laterality: N/A;    Prior to Admission medications   Medication Sig  Start Date End Date Taking? Authorizing Provider  cefTRIAXone 2 g in dextrose 5 % 50 mL Inject 2 g into the vein every 12 (twelve) hours. 09/20/15   Loletha Grayer, MD  ciprofloxacin (CIPRO) 500 MG tablet Take 500 mg by mouth 2 (two) times daily.    [provider]  doxycycline (DORYX) 100 MG EC tablet Take 100 mg by mouth 2 (two) times daily.    [provider]  metroNIDAZOLE (FLAGYL) 500 MG tablet Take 1 tablet (500 mg total) by mouth 3 (three) times daily. Patient not taking: Reported on 08/15/2018 06/22/18   Earleen Newport, MD  omeprazole (PRILOSEC) 20 MG capsule Take 20 mg by mouth daily.  01/21/15   [provider]  oxyCODONE (OXY IR/ROXICODONE) 5 MG immediate release tablet Take 1 tablet (5 mg total) by mouth every 4 (four) hours as needed for moderate pain. 09/20/15   Loletha Grayer, MD  vancomycin 1,500 mg in sodium chloride 0.9 % 500 mL Inject 1,500 mg into the vein every 12 (twelve) hours. Patient not taking: Reported on 11/11/2015 09/20/15   Loletha Grayer, MD    Allergies Penicillins  No family history on file.  Social History Social History   Tobacco Use  . Smoking status: Former Research scientist (life sciences)  . Smokeless tobacco: Never Used  Vaping Use  . Vaping Use: Never used  Substance Use Topics  . Alcohol use: No  . Drug use: Not Currently  Review of Systems Constitutional: No fever/chills Eyes: No visual changes. ENT: No sore throat. Cardiovascular: No chest pain Respiratory: Positive for SOB Gastrointestinal: No abdominal pain.  No nausea, no vomiting.  No diarrhea.  No constipation. Genitourinary: Negative for dysuria. Musculoskeletal: Negative for back pain.  Leg swelling Skin: Negative for rash. Neurological: Negative for headaches, focal weakness or numbness. All other ROS negative ____________________________________________   PHYSICAL EXAM:  VITAL SIGNS: ED Triage Vitals  Enc Vitals Group     BP 10/21/20 1324 121/74      Pulse Rate 10/21/20 1324 (!) 118     Resp 10/21/20 1324 20     Temp 10/21/20 1324 98 F (36.7 C)     Temp Source 10/21/20 1324 Oral     SpO2 10/21/20 1324 93 %     Weight 10/21/20 1328 212 lb (96.2 kg)     Height 10/21/20 1328 5\' 9"  (1.753 m)     Head Circumference --      Peak Flow --      Pain Score 10/21/20 1328 0     Pain Loc --      Pain Edu? --      Excl. in [place]? --     Constitutional: Alert and oriented. Well appearing and in no acute distress. Eyes: Conjunctivae are normal. EOMI. Head: Atraumatic. Nose: No congestion/rhinnorhea. Mouth/Throat: Mucous membranes are moist.   Neck: No stridor. Trachea Midline. FROM Cardiovascular: Tachycardic grossly normal heart sounds.  Good peripheral circulation. Respiratory: Bilateral crackles, small expiratory wheezing Gastrointestinal: Soft and nontender. No distention. No abdominal bruits.  Musculoskeletal: 2+ edema bilaterally no joint effusions. Neurologic:  Normal speech and language. No gross focal neurologic deficits are appreciated.  Skin:  Skin is warm, dry and intact. No rash noted. Psychiatric: Mood and affect are normal. Speech and behavior are normal. GU: Deferred   ____________________________________________   LABS (all labs ordered are listed, but only abnormal results are displayed)  Labs Reviewed  BASIC METABOLIC PANEL - Abnormal; Notable for the following components:      Result Value   Potassium 5.8 (*)    Glucose, Bld 110 (*)    All other components within normal limits  CBC - Abnormal; Notable for the following components:   Hemoglobin 11.1 (*)    HCT 36.8 (*)    MCH 24.7 (*)    All other components within normal limits  BRAIN NATRIURETIC PEPTIDE - Abnormal; Notable for the following components:   B Natriuretic Peptide 661.4 (*)    All other components within normal limits  TROPONIN I (HIGH SENSITIVITY) - Abnormal; Notable for the following components:   Troponin I (High Sensitivity) 52 (*)    All  other components within normal limits   ____________________________________________   ED ECG REPORT I, Vanessa [place], the attending physician, personally viewed and interpreted this ECG.  Atrial flutter with a rate of 145, no ST elevation but has had little bit of ST depression and flipped T waves in V2 V3 with a underlying right bundle branch block ____________________________________________  RADIOLOGY I, Vanessa [place], personally viewed and evaluated these images (plain radiographs) as part of my medical decision making, as well as reviewing the written report by the radiologist.  ED MD interpretation: Pulmonary edema bilaterally with bilateral small pleural effusion  Official radiology report(s): DG Chest 2 View  Result Date: 10/21/2020 CLINICAL DATA:  Shortness of breath with bilateral lower leg swelling. EXAM: CHEST - 2 VIEW COMPARISON:  June 21, 2018. FINDINGS: Small  bilateral pleural effusions with mild overlying bibasilar opacities. No visible pneumothorax. Enlarged cardiac silhouette with central pulmonary vascular congestion. Aortic atherosclerosis. No acute osseous abnormality. Degenerative changes of the spine. IMPRESSION: Findings suggestive of congestive heart failure with cardiomegaly, central pulmonary vascular congestion, and small bilateral pleural effusions. Overlying mild bibasilar opacities are favored to represent atelectasis. Electronically Signed   By: Margaretha Sheffield MD   On: 10/21/2020 14:00    ____________________________________________   PROCEDURES  Procedure(s) performed (including Critical Care):  .Critical Care Performed by: Vanessa [place], MD Authorized by: Vanessa [place], MD   Critical care provider statement:    Critical care time (minutes):  45   Critical care was necessary to treat or prevent imminent or life-threatening deterioration of the following conditions:  Cardiac failure   Critical care was time spent personally by me on the  following activities:  Discussions with consultants, evaluation of patient's response to treatment, examination of patient, ordering and performing treatments and interventions, ordering and review of laboratory studies, ordering and review of radiographic studies, pulse oximetry, re-evaluation of patient's condition, obtaining history from patient or surrogate and review of old charts .1-3 Lead EKG Interpretation Performed by: Vanessa [place], MD Authorized by: Vanessa [place], MD     Interpretation: abnormal     ECG rate:  130-145s   ECG rate assessment: tachycardic     Rhythm: atrial flutter     Ectopy: none     Conduction: normal       ____________________________________________   INITIAL IMPRESSION / ASSESSMENT AND PLAN / ED COURSE   Karl Norman was evaluated in Emergency Department on 10/21/2020 for the symptoms described in the history of present illness. He was evaluated in the context of the global COVID-19 pandemic, which necessitated consideration that the patient might be at risk for infection with the SARS-CoV-2 virus that causes COVID-19. Institutional protocols and algorithms that pertain to the evaluation of patients at risk for COVID-19 are in a state of rapid change based on information released by regulatory bodies including the CDC and federal and state organizations. These policies and algorithms were followed during the patient's care in the ED.     Pt presents with SOB.  Given patient's physical exam I am concerned this is most likely secondary to CHF  differential includes: PNA-will get xray to evaluation Anemia-CBC to evaluate ACS- will get trops Arrhythmia-Will get EKG and keep on monitor.  COVID- will get testing per algorithm. PE-lower suspicion given no risk factors and other cause more likely.  He has equal swelling in his bilateral legs.   BNP is 661 K is 5.8 this will come down with diuresis No evidence of significant anemia Cardiac marker slightly  elevated at 52 mostly demand in nature  Bedside ultrasound shows decreased EF without evidence of effusion.  We will discuss possible team for admission.  I suspect this is from CHF.  We will start with IV diuresis before trying to control his rates.  I did let Dr. Nehemiah Massed from cardiology know about patient.             ____________________________________________   FINAL CLINICAL IMPRESSION(S) / ED DIAGNOSES   Final diagnoses:  Acute on chronic heart failure, unspecified heart failure type ([place])  Atrial flutter with rapid ventricular response ([place])     MEDICATIONS GIVEN DURING THIS VISIT:  Medications  furosemide (LASIX) injection 60 mg (has no administration in time range)  furosemide (LASIX) injection 40 mg (40 mg Intravenous  Given 10/21/20 1525)     ED Discharge Orders    None       Note:  This document was prepared using Dragon voice recognition software and may include unintentional dictation errors.   Vanessa [place], MD 10/21/20 [phone]

## 2020-10-21 NOTE — ED Notes (Signed)
Extra tubes sent Blue/ purple tube sent

## 2020-10-21 NOTE — Progress Notes (Signed)
PHARMACIST - PHYSICIAN COMMUNICATION  CONCERNING:  Enoxaparin (Lovenox) for DVT Prophylaxis    RECOMMENDATION: Patient was prescribed enoxaprin 40mg  q24 hours for VTE prophylaxis.   Filed Weights   10/21/20 1328  Weight: 96.2 kg (212 lb)    Body mass index is 31.31 kg/m.  Estimated Creatinine Clearance: 63.7 mL/min (by C-G formula based on SCr of 1.2 mg/dL).   Based on [place] patient is candidate for enoxaparin 0.5mg /kg TBW SQ every 24 hours based on BMI being >30.  DESCRIPTION: Pharmacy has adjusted enoxaparin dose per [facility] policy.  Patient is now receiving enoxaparin 47.5 mg every 24 hours    Dorothe Pea, PharmD Clinical Pharmacist  10/21/2020 4:07 PM

## 2020-10-21 NOTE — ED Notes (Signed)
MD Cox at bedside 

## 2020-10-22 ENCOUNTER — Other Ambulatory Visit: Payer: Self-pay

## 2020-10-22 ENCOUNTER — Encounter: Payer: Self-pay | Admitting: Internal Medicine

## 2020-10-22 DIAGNOSIS — E538 Deficiency of other specified B group vitamins: Secondary | ICD-10-CM | POA: Diagnosis present

## 2020-10-22 DIAGNOSIS — K219 Gastro-esophageal reflux disease without esophagitis: Secondary | ICD-10-CM | POA: Diagnosis present

## 2020-10-22 DIAGNOSIS — Z96659 Presence of unspecified artificial knee joint: Secondary | ICD-10-CM | POA: Diagnosis present

## 2020-10-22 DIAGNOSIS — I5023 Acute on chronic systolic (congestive) heart failure: Secondary | ICD-10-CM | POA: Diagnosis present

## 2020-10-22 DIAGNOSIS — I110 Hypertensive heart disease with heart failure: Secondary | ICD-10-CM | POA: Diagnosis present

## 2020-10-22 DIAGNOSIS — R0902 Hypoxemia: Secondary | ICD-10-CM | POA: Diagnosis present

## 2020-10-22 DIAGNOSIS — E785 Hyperlipidemia, unspecified: Secondary | ICD-10-CM | POA: Diagnosis present

## 2020-10-22 DIAGNOSIS — D649 Anemia, unspecified: Secondary | ICD-10-CM | POA: Diagnosis present

## 2020-10-22 DIAGNOSIS — J449 Chronic obstructive pulmonary disease, unspecified: Secondary | ICD-10-CM | POA: Diagnosis present

## 2020-10-22 DIAGNOSIS — I4892 Unspecified atrial flutter: Secondary | ICD-10-CM | POA: Diagnosis present

## 2020-10-22 DIAGNOSIS — E875 Hyperkalemia: Secondary | ICD-10-CM | POA: Diagnosis present

## 2020-10-22 DIAGNOSIS — Z880 Allergy status to penicillin: Secondary | ICD-10-CM | POA: Diagnosis not present

## 2020-10-22 DIAGNOSIS — Z87891 Personal history of nicotine dependence: Secondary | ICD-10-CM | POA: Diagnosis not present

## 2020-10-22 DIAGNOSIS — Z20822 Contact with and (suspected) exposure to covid-19: Secondary | ICD-10-CM | POA: Diagnosis present

## 2020-10-22 DIAGNOSIS — I4510 Unspecified right bundle-branch block: Secondary | ICD-10-CM | POA: Diagnosis present

## 2020-10-22 DIAGNOSIS — I509 Heart failure, unspecified: Secondary | ICD-10-CM

## 2020-10-22 DIAGNOSIS — Q211 Atrial septal defect: Secondary | ICD-10-CM | POA: Diagnosis not present

## 2020-10-22 DIAGNOSIS — I4891 Unspecified atrial fibrillation: Secondary | ICD-10-CM | POA: Diagnosis present

## 2020-10-22 DIAGNOSIS — Z79899 Other long term (current) drug therapy: Secondary | ICD-10-CM | POA: Diagnosis not present

## 2020-10-22 DIAGNOSIS — I700 Atherosclerosis of aorta: Secondary | ICD-10-CM | POA: Diagnosis present

## 2020-10-22 DIAGNOSIS — Z8679 Personal history of other diseases of the circulatory system: Secondary | ICD-10-CM | POA: Diagnosis not present

## 2020-10-22 LAB — ECHOCARDIOGRAM COMPLETE
AR max vel: 1.89 cm2
AV Peak grad: 5.6 mmHg
Ao pk vel: 1.18 m/s
Area-P 1/2: 7.37 cm2
Height: 69 in
S' Lateral: 4.75 cm
Weight: 3392 oz

## 2020-10-22 LAB — BASIC METABOLIC PANEL
Anion gap: 11 (ref 5–15)
BUN: 16 mg/dL (ref 8–23)
CO2: 27 mmol/L (ref 22–32)
Calcium: 8.9 mg/dL (ref 8.9–10.3)
Chloride: 97 mmol/L — ABNORMAL LOW (ref 98–111)
Creatinine, Ser: 1.25 mg/dL — ABNORMAL HIGH (ref 0.61–1.24)
GFR, Estimated: >60 mL/min (ref >60)
Glucose, Bld: 91 mg/dL (ref 70–99)
Potassium: 4.5 mmol/L (ref 3.5–5.1)
Sodium: 135 mmol/L (ref 135–145)

## 2020-10-22 LAB — SARS CORONAVIRUS 2 (TAT 6-24 HRS): SARS Coronavirus 2: NEGATIVE

## 2020-10-22 LAB — TSH: TSH: 2.849 u[IU]/mL (ref 0.350–4.500)

## 2020-10-22 MED ORDER — SODIUM CHLORIDE 0.9% FLUSH
10.0000 mL | Freq: Two times a day (BID) | INTRAVENOUS | Status: DC
  Administered 2020-10-22 – 2020-10-25 (×6): 10 mL via INTRAVENOUS

## 2020-10-22 MED ORDER — METOPROLOL TARTRATE 50 MG PO TABS
50.0000 mg | ORAL_TABLET | Freq: Two times a day (BID) | ORAL | Status: DC
  Administered 2020-10-22 – 2020-10-23 (×4): 50 mg via ORAL
  Filled 2020-10-22 (×4): qty 1

## 2020-10-22 MED ORDER — METOPROLOL TARTRATE 5 MG/5ML IV SOLN
5.0000 mg | Freq: Once | INTRAVENOUS | Status: AC
  Administered 2020-10-22: 5 mg via INTRAVENOUS
  Filled 2020-10-22: qty 5

## 2020-10-22 NOTE — ED Notes (Signed)
Cardiologist at bedside.  

## 2020-10-22 NOTE — Progress Notes (Signed)
PROGRESS NOTE    Karl Norman  ERX:[id number] DOB: Aug 30, 1946 DOA: 10/21/2020 PCP: Idelle Crouch, MD   Brief Narrative: Taken from H&P Karl Norman is a 75 y.o. male with medical history significant for hypertension, GERD presents to the emergency department for shortness of breath.  He endorses worsening shortness of breath x 14 days and swelling of the lower extermities for 12 days. He reports that he drinks about 1 gallon of fluid per day (coffee and water). He states he has shortness of breath that is worst with exertion.  He denies chest pain, vision changes, dysuria, abdominal pain, hematuria, diarrhea, nausea, vomiting.  He denies syncopal episodes and or loss of consciousness.    He was hypoxic requiring BiPAP initially.  Elevated BNP at 661, EKG with atrial flutter and RVR, cardiology was consulted and he was started on IV diuresis along with metoprolol.  Subjective: Patient was sitting at the edge of the bed with feces all over the floor, stating that he missed the toilet and mess up all the floor. Continue to have some shortness of breath.  No chest pain. Saturating in low 90s on 2 L of oxygen.  Assessment & Plan:   Active Problems:   Heart failure (HCC)   Acute exacerbation of CHF (congestive heart failure) (HCC)  Acute systolic heart failure.  Echocardiogram with new diagnosis of reduced EF of 30 to 35%.  With dilated all chambers and global hypokinesis.  Grade 3 diastolic dysfunction.  Breathing status improved with diuresis. Cardiology is on board-appreciate their help -Patient will need ischemic work-up for new diagnosis of reduced EF, he had normal EF on prior echo was done in 2019-I will defer that decision to his cardiologist. -Continue with IV Lasix -Daily BMP and weight -Strict intake and output  Atrial flutter with RVR.  Heart rate remained elevated. Cardiology started him on metoprolol and will consider cardioversion once little improved from heart failure  standpoint. -Continue with Lovenox. -Continue with metoprolol  GERD. -Continue with PPI  B12 deficiency. -Continue home supplement of B12  Hypertension.  Blood pressure within goal. He was on low-dose Lasix only at home. -Continue to monitor.  Objective: Vitals:   10/22/20 1030 10/22/20 1100 10/22/20 1203 10/22/20 1503  BP: 104/77 112/89 96/69   Pulse: (!) 132 (!) 132 (!) 126   Resp: 14 16 20    Temp:   97.7 F (36.5 C)   TempSrc:      SpO2: 100% 97% 95%   Weight:    97.4 kg  Height:    5\' 9"  (1.753 m)    Intake/Output Summary (Last 24 hours) at 10/22/2020 1553 Last data filed at 10/22/2020 1504 Gross per 24 hour  Intake 477 ml  Output 1525 ml  Net -1048 ml   Filed Weights   10/21/20 1328 10/22/20 1503  Weight: 96.2 kg 97.4 kg    Examination:  General exam: Appears calm and comfortable  Respiratory system: Clear to auscultation. Respiratory effort normal. Cardiovascular system: S1 & S2 heard, RRR.  Gastrointestinal system: Soft, nontender, nondistended, bowel sounds positive. Central nervous system: Alert and oriented. No focal neurological deficits. Extremities: 1+ LE edema, no cyanosis, pulses intact and symmetrical. Psychiatry: Judgement and insight appear normal.    DVT prophylaxis: Lovenox Code Status: Full Family Communication: Discussed with patient Disposition Plan:  Status is: Inpatient  Remains inpatient appropriate because:Inpatient level of care appropriate due to severity of illness   Dispo: The patient is from: Home  Anticipated d/c is to: Home              Anticipated d/c date is: 2 days              Patient currently is not medically stable to d/c.   Difficult to place patient No              Level of care: Progressive Cardiac  Consultants:   Cardiology  Procedures:  Antimicrobials:   Data Reviewed: I have personally reviewed following labs and imaging studies  CBC: Recent Labs  Lab 10/21/20 1331  WBC 5.7  HGB  11.1*  HCT 36.8*  MCV 82.0  PLT 99991111   Basic Metabolic Panel: Recent Labs  Lab 10/21/20 1331 10/22/20 1000  NA 136 135  K 5.8* 4.5  CL 100 97*  CO2 27 27  GLUCOSE 110* 91  BUN 15 16  CREATININE 1.20 1.25*  CALCIUM 9.3 8.9   GFR: Estimated Creatinine Clearance: 61.5 mL/min (A) (by C-G formula based on SCr of 1.25 mg/dL (H)). Liver Function Tests: No results for input(s): AST, ALT, ALKPHOS, BILITOT, PROT, ALBUMIN in the last 168 hours. No results for input(s): LIPASE, AMYLASE in the last 168 hours. No results for input(s): AMMONIA in the last 168 hours. Coagulation Profile: No results for input(s): INR, PROTIME in the last 168 hours. Cardiac Enzymes: No results for input(s): CKTOTAL, CKMB, CKMBINDEX, TROPONINI in the last 168 hours. BNP (last 3 results) No results for input(s): PROBNP in the last 8760 hours. HbA1C: No results for input(s): HGBA1C in the last 72 hours. CBG: No results for input(s): GLUCAP in the last 168 hours. Lipid Profile: No results for input(s): CHOL, HDL, LDLCALC, TRIG, CHOLHDL, LDLDIRECT in the last 72 hours. Thyroid Function Tests: Recent Labs    10/22/20 0459  TSH 2.849   Anemia Panel: No results for input(s): VITAMINB12, FOLATE, FERRITIN, TIBC, IRON, RETICCTPCT in the last 72 hours. Sepsis Labs: No results for input(s): PROCALCITON, LATICACIDVEN in the last 168 hours.  Recent Results (from the past 240 hour(s))  SARS CORONAVIRUS 2 (TAT 6-24 HRS) Nasopharyngeal Nasopharyngeal Swab     Status: None   Collection Time: 10/21/20  6:09 PM   Specimen: Nasopharyngeal Swab  Result Value Ref Range Status   SARS Coronavirus 2 NEGATIVE NEGATIVE Final    Comment: (NOTE) SARS-CoV-2 target nucleic acids are NOT DETECTED.  The SARS-CoV-2 RNA is generally detectable in upper and lower respiratory specimens during the acute phase of infection. Negative results do not preclude SARS-CoV-2 infection, do not rule out co-infections with other pathogens, and  should not be used as the sole basis for treatment or other patient management decisions. Negative results must be combined with clinical observations, patient history, and epidemiological information. The expected result is Negative.  Fact Sheet for Patients: SugarRoll.be  Fact Sheet for Healthcare Providers: https://www.woods-mathews.com/  This test is not yet approved or cleared by the [place] FDA and  has been authorized for detection and/or diagnosis of SARS-CoV-2 by FDA under an Emergency Use Authorization (EUA). This EUA will remain  in effect (meaning this test can be used) for the duration of the COVID-19 declaration under Se ction 564(b)(1) of the Act, 21 U.S.C. section 360bbb-3(b)(1), unless the authorization is terminated or revoked sooner.  Performed at [facility], [place] [address], [place], [place] [address]      Radiology Studies: DG Chest 2 View  Result Date: 10/21/2020 CLINICAL DATA:  Shortness of breath with bilateral lower  leg swelling. EXAM: CHEST - 2 VIEW COMPARISON:  June 21, 2018. FINDINGS: Small bilateral pleural effusions with mild overlying bibasilar opacities. No visible pneumothorax. Enlarged cardiac silhouette with central pulmonary vascular congestion. Aortic atherosclerosis. No acute osseous abnormality. Degenerative changes of the spine. IMPRESSION: Findings suggestive of congestive heart failure with cardiomegaly, central pulmonary vascular congestion, and small bilateral pleural effusions. Overlying mild bibasilar opacities are favored to represent atelectasis. Electronically Signed   By: Margaretha Sheffield MD   On: 10/21/2020 14:00   ECHOCARDIOGRAM COMPLETE  Result Date: 10/22/2020    ECHOCARDIOGRAM REPORT   Patient Name:   Karl Norman Date of Exam: 10/21/2020 Medical Rec #:  [id number]    Height:       69.0 in Accession #:    [id number]   Weight:       212.0 lb Date of Birth:  1946/04/04   BSA:           2.118 m Patient Age:    35 years     BP:           101/75 mmHg Patient Gender: M            HR:           128 bpm. Exam Location:  [facility] Procedure: 2D Echo, Cardiac Doppler and Color Doppler Indications:     Dyspnea R06.00  History:         Patient has prior history of Echocardiogram examinations. CHF;                  Signs/Symptoms:Dyspnea.  Sonographer:     Alyse Low Roar Referring Phys:  [id number] AMY N COX Diagnosing Phys: Serafina Royals MD IMPRESSIONS  1. Left ventricular ejection fraction, by estimation, is 30 to 35%. The left ventricle has moderately decreased function. The left ventricle demonstrates global hypokinesis. The left ventricular internal cavity size was moderately dilated. Left ventricular diastolic parameters are consistent with Grade III diastolic dysfunction (restrictive).  2. Right ventricular systolic function is moderately reduced. The right ventricular size is moderately enlarged.  3. Left atrial size was moderately dilated.  4. Right atrial size was moderately dilated.  5. 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  6. Tricuspid valve regurgitation is moderate to severe.  7. The aortic valve is normal in structure. Aortic valve regurgitation is mild. FINDINGS  Left Ventricle: Left ventricular ejection fraction, by estimation, is 30 to 35%. The left ventricle has moderately decreased function. The left ventricle demonstrates global hypokinesis. The left ventricular internal cavity size was moderately dilated. There is no left ventricular hypertrophy. Left ventricular diastolic parameters are consistent with Grade III diastolic dysfunction (restrictive). Right Ventricle: The right ventricular size is moderately enlarged. No increase in right ventricular wall thickness. Right ventricular systolic function is moderately reduced. Left Atrium: Left atrial size was moderately dilated. Right Atrium: Right atrial size was moderately dilated. Pericardium: There is no  evidence of pericardial effusion. 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 Tricuspid Valve: The tricuspid valve is normal in structure. Tricuspid valve regurgitation is moderate to severe. Aortic Valve: The aortic valve is normal in structure. Aortic valve regurgitation is mild. Aortic valve peak gradient measures 5.6 mmHg. Pulmonic Valve: The pulmonic valve was normal in structure. Pulmonic valve regurgitation is mild. Aorta: The aortic root and ascending aorta are structurally normal, with no evidence of dilitation. IAS/Shunts: No atrial level shunt detected by color flow Doppler.  LEFT VENTRICLE PLAX 2D LVIDd:  5.56 cm  Diastology LVIDs:         4.75 cm  LV e' medial:    9.03 cm/s LV PW:         1.08 cm  LV E/e' medial:  15.2 LV IVS:        1.20 cm  LV e' lateral:   15.40 cm/s LVOT diam:     2.00 cm  LV E/e' lateral: 8.9 LVOT Area:     3.14 cm  RIGHT VENTRICLE RV Mid diam:    4.98 cm RV S prime:     8.81 cm/s TAPSE (M-mode): 1.4 cm LEFT ATRIUM              Index       RIGHT ATRIUM           Index LA diam:        4.60 cm  2.17 cm/m  RA Area:     29.60 cm LA Vol (A2C):   134.0 ml 63.27 ml/m RA Volume:   101.00 ml 47.69 ml/m LA Vol (A4C):   93.3 ml  44.05 ml/m LA Biplane Vol: 113.0 ml 53.36 ml/m  AORTIC VALVE                PULMONIC VALVE AV Area (Vmax): 1.89 cm    PV Vmax:        0.65 m/s AV Vmax:        118.00 cm/s PV Peak grad:   1.7 mmHg AV Peak Grad:   5.6 mmHg    RVOT Peak grad: 0 mmHg LVOT Vmax:      71.10 cm/s  AORTA Ao Root diam: 3.20 cm MITRAL VALVE                TRICUSPID VALVE MV Area (PHT): 7.37 cm     TR Peak grad:   21.5 mmHg MV Decel Time: 103 msec     TR Vmax:        232.00 cm/s MV E velocity: 137.00 cm/s                             SHUNTS                             Systemic Diam: 2.00 cm Serafina Royals MD Electronically signed by Serafina Royals MD Signature Date/Time: 10/22/2020/8:22:48 AM    Final     Scheduled Meds: . enoxaparin (LOVENOX)  injection  0.5 mg/kg Subcutaneous Q24H  . furosemide  60 mg Intravenous Daily  . metoprolol tartrate  50 mg Oral BID  . pantoprazole  40 mg Oral Daily  . vitamin B-12  1,000 mcg Oral Daily   Continuous Infusions:   LOS: 0 days   Time spent: 35 minutes.  Lorella Nimrod, MD [facility]  If 7PM-7AM, please contact night-coverage [email or web address]  10/22/2020, 3:53 PM   This record has been created using Systems analyst. Errors have been sought and corrected,but may not always be located. Such creation errors do not reflect on the standard of care.

## 2020-10-22 NOTE — ED Notes (Addendum)
Pt ambulated to room toilet with cane. Pt had BM but apparantly missed toilet and then stepped in feces and tracked it around the room. Pt apologetic. Reassured pt. Attempted to clean floor but feces is dry. Called EVS to come. Reconnected pt to monitor.  O2 applied, 2L per [place]. SPO2 was noted to be 88% on RA.

## 2020-10-22 NOTE — ED Notes (Signed)
Informed floor nurse that pt is on the way.

## 2020-10-22 NOTE — Consult Note (Addendum)
   Heart Failure Nurse Navigator Note  HFrEF 94-50%, Grade 3 diastolic dysfunction.  Right ventricular systolic function is moderately reduced.  Severe mitral regurgitation.  Moderate to severe tricuspid regurgitation.  Mild aortic insufficiency.  Echocardiogram performed in 2017 patient's ejection fraction was 50 to 55%.  Patient presented to the emergency room with increasing shortness of breath, leg swelling, PND and orthopnea.  He states almost overnight that he had gained 30 pounds.  States that he had noticed since Christmas time decline in his energy level.  Comorbidities:   Hypertension Hyperlipidemia  Medications:  Lasix 60 mg IV daily Metoprolol tartrate 50 mg twice daily   Labs:  Sodium 135, potassium 4.5, chloride 97, CO2 27 BUN 16 creatinine 1.25, GFR estimated at greater than 60, TSH 2.849, BNP 661, troponin 43 and 52. Weight 96.2 kg BMI 31.31 Blood pressures 96/69 Monitor reveals atrial flutter with rates in the 120s.  Assessment:  General he is awake and alert, having just walked back from the bathroom, no acute distress noted.  HEENT-wears glasses, positive JVD.  Cardiac-heart tones are regular rate and rhythm  Chest-breath sounds are clear, no wheezes or rhonchi noted.  Musculoskeletal lateral legs edematous to the thigh.  Neurologic-speech is clear, moves all extremities without difficulty.  Psych-is pleasant and appropriate    Initial visit with patient today.  He said he had noticed a change in his condition at about Christmas time he said prior to that he had been playing golf at least 3 times a week and had not noted any difficulties.  States that he had not followed up with Dr. Nehemiah Massed due to the Covid pandemic.  But just prior to presenting to the emergency room he had noted a 30 pound weight gain long with the increased swelling, PND and orthopnea.  He has slept in a recliner for approximately 2 to 3 weeks  Discussed heart failure with the  patient.  Also discussed low salt and abstaining from using it is an important part in taking care of his heart failure.  He voices understanding and is willing to accept that.  He states that it means keeping him out of the hospital he will not to cook with salt nor added at the table.  He feels at home that they eat healthy with fresh fruits and vegetables and if they do use canned, he does poor off the liquid.  Do not use convenience foods.  Also discussed fluid restrictions, weight daily and what to report to physician.  He states that if he does not convert to normal rhythm that Dr. Nehemiah Massed is planned for a TEE cardioversion.  Instructed that it does look like he is still in the atrial flutter rhythm with rates in the 120s.  Pricilla Riffle RN, CHFN

## 2020-10-22 NOTE — Progress Notes (Signed)
[facility] progress note  Patient ID: Karl Norman, MRN: [id number], DOB/AGE: 1946-03-01 75 y.o. Admit date: 10/21/2020   Date of Consult: 10/22/2020    2/1.  Patient overall doing much better today.  He feels well with no evidence of chest pain or significant shortness of breath.  The patient has had significant urine output and lower extremities and pulmonary edema slightly improved.  The patient remains in atrial flutter with 2-1 block at 139 bpm.  We have discussed at length that the patient may benefit from electrical cardioversion due to medication management not typically helping with atrial flutter very well.  He is the understanding of this may work  Echocardiogram showing no severe LV systolic dysfunction with ejection fraction of 25% with moderate to severe mitral and tricuspid regurgitation with biatrial enlargement and biventricular enlargement.   Past Medical History:  Diagnosis Date  . CHF (congestive heart failure) ([place])    had three tubes to drain heart 2011  . COPD (chronic obstructive pulmonary disease) ([place])   . Dyspnea    with exertion  . GERD (gastroesophageal reflux disease)   . 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 Pancreatitis       Surgical History:  Past Surgical History:  Procedure Laterality Date  . COLONOSCOPY WITH PROPOFOL N/A 08/15/2018   Procedure: COLONOSCOPY WITH PROPOFOL;  Surgeon: Manya Silvas, MD;  Location: [facility] ENDOSCOPY;  Service: Endoscopy;  Laterality: N/A;  . ERCP with stent  07/22/2018  . REPLACEMENT TOTAL KNEE    . TEE WITHOUT CARDIOVERSION N/A 11/11/2015   Procedure: TRANSESOPHAGEAL ECHOCARDIOGRAM (TEE);  Surgeon: Corey Skains, MD;  Location: [facility] ORS;  Service: Cardiovascular;  Laterality: N/A;     Home Meds: Prior to Admission medications   Medication Sig Start Date End Date Taking? Authorizing Provider  b complex vitamins capsule Take 1 capsule by mouth daily.    Yes [provider]  furosemide (LASIX) 20 MG tablet Take 20 mg by mouth daily. 10/08/20  Yes [provider]  ibuprofen (ADVIL) 200 MG tablet Take 400 mg by mouth every 6 (six) hours as needed for mild pain.   Yes [provider]  omeprazole (PRILOSEC) 20 MG capsule Take 20 mg by mouth daily.   Yes [provider]  simethicone (MYLICON) 80 MG chewable tablet Chew 80 mg by mouth every 6 (six) hours as needed for flatulence.   Yes [provider]  vitamin B-12 (CYANOCOBALAMIN) 1000 MCG tablet Take 1,000 mcg by mouth daily.   Yes [provider]    Inpatient Medications:  . enoxaparin (LOVENOX) injection  0.5 mg/kg Subcutaneous Q24H  . furosemide  60 mg Intravenous Daily  . metoprolol tartrate  50 mg Oral BID  . pantoprazole  40 mg Oral Daily  . vitamin B-12  1,000 mcg Oral Daily     Allergies:  Allergies  Allergen Reactions  . Penicillins     Has patient had a PCN reaction causing immediate rash, facial/tongue/throat swelling, SOB or lightheadedness with hypotension: NO Has patient had a PCN reaction causing severe rash involving mucus membranes or skin necrosis: YES Has patient had a PCN reaction that required hospitalization: YES Has patient had a PCN reaction occurring within the last 10 years: NO If all of the above answers are "NO", then may proceed with Cephalosporin use.     Social History   Socioeconomic History  . Marital status: Married    Spouse name:  Not on file  . Number of children: Not on file  . Years of education: Not on file  . Highest education level: Not on file  Occupational History  . Not on file  Tobacco Use  . Smoking status: Former Research scientist (life sciences)  . Smokeless tobacco: Never Used  Vaping Use  . Vaping Use: Never used  Substance and Sexual Activity  . Alcohol use: No  . Drug use: Not Currently  . Sexual activity: Not on file  Other Topics Concern  . Not on file  Social History Narrative  . Not on  file   Social Determinants of Health   Financial Resource Strain: Not on file  Food Insecurity: Not on file  Transportation Needs: Not on file  Physical Activity: Not on file  Stress: Not on file  Social Connections: Not on file  Intimate Partner Violence: Not on file     No family history on file.   Review of Systems Positive for shortness of breath Negative for: General:  chills, fever, night sweats or weight changes.  Cardiovascular: PND orthopnea syncope dizziness  Dermatological skin lesions rashes Respiratory: Cough congestion Urologic: Frequent urination urination at night and hematuria Abdominal: negative for nausea, vomiting, diarrhea, bright red blood per rectum, melena, or hematemesis Neurologic: negative for visual changes, and/or hearing changes  All other systems reviewed and are otherwise negative except as noted above.  Labs: No results for input(s): CKTOTAL, CKMB, TROPONINI in the last 72 hours. Lab Results  Component Value Date   WBC 5.7 10/21/2020   HGB 11.1 (L) 10/21/2020   HCT 36.8 (L) 10/21/2020   MCV 82.0 10/21/2020   PLT 186 10/21/2020    Recent Labs  Lab 10/21/20 1331  NA 136  K 5.8*  CL 100  CO2 27  BUN 15  CREATININE 1.20  CALCIUM 9.3  GLUCOSE 110*   No results found for: CHOL, HDL, LDLCALC, TRIG No results found for: DDIMER  Radiology/Studies:  DG Chest 2 View  Result Date: 10/21/2020 CLINICAL DATA:  Shortness of breath with bilateral lower leg swelling. EXAM: CHEST - 2 VIEW COMPARISON:  June 21, 2018. FINDINGS: Small bilateral pleural effusions with mild overlying bibasilar opacities. No visible pneumothorax. Enlarged cardiac silhouette with central pulmonary vascular congestion. Aortic atherosclerosis. No acute osseous abnormality. Degenerative changes of the spine. IMPRESSION: Findings suggestive of congestive heart failure with cardiomegaly, central pulmonary vascular congestion, and small bilateral pleural effusions. Overlying  mild bibasilar opacities are favored to represent atelectasis. Electronically Signed   By: Margaretha Sheffield MD   On: 10/21/2020 14:00   ECHOCARDIOGRAM COMPLETE  Result Date: 10/22/2020    ECHOCARDIOGRAM REPORT   Patient Name:   DAJUAN TURNLEY Date of Exam: 10/21/2020 Medical Rec #:  [id number]    Height:       69.0 in Accession #:    [id number]   Weight:       212.0 lb Date of Birth:  [birth year]   BSA:          2.118 m Patient Age:    12 years     BP:           101/75 mmHg Patient Gender: M            HR:           128 bpm. Exam Location:  [facility] Procedure: 2D Echo, Cardiac Doppler and Color Doppler Indications:     Dyspnea R06.00  History:         Patient has  prior history of Echocardiogram examinations. CHF;                  Signs/Symptoms:Dyspnea.  Sonographer:     Alyse Low Roar Referring Phys:  [id number] AMY N COX Diagnosing Phys: Serafina Royals MD IMPRESSIONS  1. Left ventricular ejection fraction, by estimation, is 30 to 35%. The left ventricle has moderately decreased function. The left ventricle demonstrates global hypokinesis. The left ventricular internal cavity size was moderately dilated. Left ventricular diastolic parameters are consistent with Grade III diastolic dysfunction (restrictive).  2. Right ventricular systolic function is moderately reduced. The right ventricular size is moderately enlarged.  3. Left atrial size was moderately dilated.  4. Right atrial size was moderately dilated.  5. 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  6. Tricuspid valve regurgitation is moderate to severe.  7. The aortic valve is normal in structure. Aortic valve regurgitation is mild. FINDINGS  Left Ventricle: Left ventricular ejection fraction, by estimation, is 30 to 35%. The left ventricle has moderately decreased function. The left ventricle demonstrates global hypokinesis. The left ventricular internal cavity size was moderately dilated. There is no left ventricular hypertrophy. Left  ventricular diastolic parameters are consistent with Grade III diastolic dysfunction (restrictive). Right Ventricle: The right ventricular size is moderately enlarged. No increase in right ventricular wall thickness. Right ventricular systolic function is moderately reduced. Left Atrium: Left atrial size was moderately dilated. Right Atrium: Right atrial size was moderately dilated. Pericardium: There is no evidence of pericardial effusion. 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 Tricuspid Valve: The tricuspid valve is normal in structure. Tricuspid valve regurgitation is moderate to severe. Aortic Valve: The aortic valve is normal in structure. Aortic valve regurgitation is mild. Aortic valve peak gradient measures 5.6 mmHg. Pulmonic Valve: The pulmonic valve was normal in structure. Pulmonic valve regurgitation is mild. Aorta: The aortic root and ascending aorta are structurally normal, with no evidence of dilitation. IAS/Shunts: No atrial level shunt detected by color flow Doppler.  LEFT VENTRICLE PLAX 2D LVIDd:         5.56 cm  Diastology LVIDs:         4.75 cm  LV e' medial:    9.03 cm/s LV PW:         1.08 cm  LV E/e' medial:  15.2 LV IVS:        1.20 cm  LV e' lateral:   15.40 cm/s LVOT diam:     2.00 cm  LV E/e' lateral: 8.9 LVOT Area:     3.14 cm  RIGHT VENTRICLE RV Mid diam:    4.98 cm RV S prime:     8.81 cm/s TAPSE (M-mode): 1.4 cm LEFT ATRIUM              Index       RIGHT ATRIUM           Index LA diam:        4.60 cm  2.17 cm/m  RA Area:     29.60 cm LA Vol (A2C):   134.0 ml 63.27 ml/m RA Volume:   101.00 ml 47.69 ml/m LA Vol (A4C):   93.3 ml  44.05 ml/m LA Biplane Vol: 113.0 ml 53.36 ml/m  AORTIC VALVE                PULMONIC VALVE AV Area (Vmax): 1.89 cm    PV Vmax:        0.65 m/s AV Vmax:  118.00 cm/s PV Peak grad:   1.7 mmHg AV Peak Grad:   5.6 mmHg    RVOT Peak grad: 0 mmHg LVOT Vmax:      71.10 cm/s  AORTA Ao Root diam: 3.20 cm MITRAL VALVE                 TRICUSPID VALVE MV Area (PHT): 7.37 cm     TR Peak grad:   21.5 mmHg MV Decel Time: 103 msec     TR Vmax:        232.00 cm/s MV E velocity: 137.00 cm/s                             SHUNTS                             Systemic Diam: 2.00 cm Serafina Royals MD Electronically signed by Serafina Royals MD Signature Date/Time: 10/22/2020/8:22:48 AM    Final     Telemetry atrial flutter with right bundle branch block and rapid ventricular rate  Weights: Filed Weights   10/21/20 1328  Weight: 96.2 kg     Physical Exam: Blood pressure 94/75, pulse (!) 128, temperature 98 F (36.7 C), temperature source Oral, resp. rate 18, height 5\' 9"  (1.753 m), weight 96.2 kg, SpO2 93 %. Body mass index is 31.31 kg/m. General: Well developed, well nourished, in no acute distress. Head eyes ears nose throat: Normocephalic, atraumatic, sclera non-icteric, no xanthomas, nares are without discharge. No apparent thyromegaly and/or mass  Lungs: Normal respiratory effort.  no wheezes, no rales, no rhonchi.  Heart: Rapid with normal S1 S2.  3-4+ apical murmur gallop, no rub, PMI is normal size and placement, carotid upstroke normal without bruit, jugular venous pressure is normal Abdomen: Soft, non-tender, non-distended with normoactive bowel sounds. No hepatomegaly. No rebound/guarding. No obvious abdominal masses. Abdominal aorta is normal size without bruit Extremities: 1+ edema. no cyanosis, no clubbing, no ulcers  Peripheral : 2+ bilateral upper extremity pulses, 2+ bilateral femoral pulses, 2+ bilateral dorsal pedal pulse Neuro: Alert and oriented. No facial asymmetry. No focal deficit. Moves all extremities spontaneously. Musculoskeletal: Normal muscle tone without kyphosis Psych:  Responds to questions appropriately with a normal affect.    Assessment: 75 year old male with valvular heart disease and acute on chronic systolic dysfunction congestive heart failure secondary to atrial flutter with rapid  ventricular rate and no current evidence of acute coronary syndrome  Plan: 1.  Continue heart rate control if able with metoprolol and increasing doses at this time with a goal heart rate below 110 bpm 2.  Continuation of anticoagulation with enoxaparin for further risk reduction in stroke with atrial flutter 3.  Further consideration of transesophageal echocardiogram with electrical cardioversion to normal sinus rhythm as patient potentially improves from congestive heart failure.  Patient understands the risk and benefits of electrical cardioversion and transesophageal echocardiogram.  This includes the possibility of death stroke heart attack esophageal perforation and other rhythm disturbances and side effects of medication management and anesthesia.  The patient is at low risk for general anesthesia  Signed, Corey Skains M.D. [facility] 10/22/2020, 8:55 AM

## 2020-10-22 NOTE — ED Notes (Signed)
Pt sitting on toilet. Pt ambulated to toilet with steady and without dizziness. Pt expressing frustration at multiple lab draws, hasn't eaten, says was pushing call bell but nothing was illuminated. Wrote again to attending to ask if can put in diet order.

## 2020-10-23 LAB — BASIC METABOLIC PANEL
Anion gap: 10 (ref 5–15)
Anion gap: 10 (ref 5–15)
BUN: 21 mg/dL (ref 8–23)
BUN: 21 mg/dL (ref 8–23)
CO2: 29 mmol/L (ref 22–32)
CO2: 27 mmol/L (ref 22–32)
Calcium: 9 mg/dL (ref 8.9–10.3)
Calcium: 8.7 mg/dL — ABNORMAL LOW (ref 8.9–10.3)
Chloride: 99 mmol/L (ref 98–111)
Chloride: 97 mmol/L — ABNORMAL LOW (ref 98–111)
Creatinine, Ser: 1.51 mg/dL — ABNORMAL HIGH (ref 0.61–1.24)
Creatinine, Ser: 1.38 mg/dL — ABNORMAL HIGH (ref 0.61–1.24)
GFR, Estimated: 49 mL/min — ABNORMAL LOW (ref >60)
GFR, Estimated: 54 mL/min — ABNORMAL LOW (ref >60)
Glucose, Bld: 99 mg/dL (ref 70–99)
Glucose, Bld: 112 mg/dL — ABNORMAL HIGH (ref 70–99)
Potassium: 5.7 mmol/L — ABNORMAL HIGH (ref 3.5–5.1)
Potassium: 4.5 mmol/L (ref 3.5–5.1)
Sodium: 138 mmol/L (ref 135–145)
Sodium: 134 mmol/L — ABNORMAL LOW (ref 135–145)

## 2020-10-23 MED ORDER — IPRATROPIUM-ALBUTEROL 0.5-2.5 (3) MG/3ML IN SOLN: 3.0000 mL | Freq: Four times a day (QID) | RESPIRATORY_TRACT | Status: DC

## 2020-10-23 MED ORDER — APIXABAN 5 MG PO TABS
5.0000 mg | ORAL_TABLET | Freq: Two times a day (BID) | ORAL | Status: DC
  Administered 2020-10-23 – 2020-10-25 (×5): 5 mg via ORAL
  Filled 2020-10-23 (×5): qty 1

## 2020-10-23 MED ORDER — FUROSEMIDE 40 MG PO TABS
40.0000 mg | ORAL_TABLET | Freq: Every day | ORAL | Status: DC
  Administered 2020-10-23 – 2020-10-25 (×2): 40 mg via ORAL
  Filled 2020-10-23 (×2): qty 1

## 2020-10-23 MED ORDER — LEVALBUTEROL HCL 1.25 MG/0.5ML IN NEBU
1.2500 mg | INHALATION_SOLUTION | Freq: Four times a day (QID) | RESPIRATORY_TRACT | Status: DC
  Administered 2020-10-23 (×2): 1.25 mg via RESPIRATORY_TRACT
  Filled 2020-10-23 (×5): qty 0.5

## 2020-10-23 MED ORDER — SODIUM CHLORIDE 0.9 % IV SOLN: INTRAVENOUS | Status: DC

## 2020-10-23 NOTE — Progress Notes (Signed)
[facility] Cardiology progress note  Patient ID: Karl Norman, MRN: [id number], DOB/AGE: 02/13/1946 75 y.o. Admit date: 10/21/2020   Date of Consult: 10/23/2020    2/1.  Patient overall doing much better today.  He feels well with no evidence of chest pain or significant shortness of breath.  The patient has had significant urine output and lower extremities and pulmonary edema slightly improved.  The patient remains in atrial flutter with 2-1 block at 139 bpm.  We have discussed at length that the patient may benefit from electrical cardioversion due to medication management not typically helping with atrial flutter very well.  He is the understanding of this may work  2/2.  Patient is ambulating in the hall without evidence of significant congestive heart failure type symptoms angina or other concerns.  Patient does have continued atrial flutter at 128bpm and unchanged by medication management increases including metoprolol.  We have discussed that atrial flutter is not typically sensitive to these medication management or antiarrhythmics and electrical cardioversion may be more appropriate.  He has agreed to this although we have discussed the possibility of anticoagulation for risk reduction in stroke with atrial fibrillation as well as transesophageal echocardiogram to assess for left atrial appendage thrombus prior to electrical cardioversion.  He understands all the risks and benefits of these treatments and agrees at this time.  He is at low risk for general anesthesia  Echocardiogram showing no severe LV systolic dysfunction with ejection fraction of 25% with moderate to severe mitral and tricuspid regurgitation with biatrial enlargement and biventricular enlargement.   Past Medical History:  Diagnosis Date  . CHF (congestive heart failure) ([place])    had three tubes to drain heart 2011  . COPD (chronic obstructive pulmonary disease) ([place])   . Dyspnea    with exertion  . GERD  (gastroesophageal reflux disease)   . 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 Pancreatitis       Surgical History:  Past Surgical History:  Procedure Laterality Date  . COLONOSCOPY WITH PROPOFOL N/A 08/15/2018   Procedure: COLONOSCOPY WITH PROPOFOL;  Surgeon: Manya Silvas, MD;  Location: [facility] ENDOSCOPY;  Service: Endoscopy;  Laterality: N/A;  . ERCP with stent  07/22/2018  . REPLACEMENT TOTAL KNEE    . TEE WITHOUT CARDIOVERSION N/A 11/11/2015   Procedure: TRANSESOPHAGEAL ECHOCARDIOGRAM (TEE);  Surgeon: Corey Skains, MD;  Location: [facility] ORS;  Service: Cardiovascular;  Laterality: N/A;     Home Meds: Prior to Admission medications   Medication Sig Start Date End Date Taking? Authorizing Provider  b complex vitamins capsule Take 1 capsule by mouth daily.   Yes [provider]  furosemide (LASIX) 20 MG tablet Take 20 mg by mouth daily. 10/08/20  Yes [provider]  ibuprofen (ADVIL) 200 MG tablet Take 400 mg by mouth every 6 (six) hours as needed for mild pain.   Yes [provider]  omeprazole (PRILOSEC) 20 MG capsule Take 20 mg by mouth daily.   Yes [provider]  simethicone (MYLICON) 80 MG chewable tablet Chew 80 mg by mouth every 6 (six) hours as needed for flatulence.   Yes [provider]  vitamin B-12 (CYANOCOBALAMIN) 1000 MCG tablet Take 1,000 mcg by mouth daily.   Yes [provider]    Inpatient Medications:  . enoxaparin (LOVENOX) injection  0.5 mg/kg Subcutaneous Q24H  . furosemide  40 mg Oral Daily  . metoprolol tartrate  50 mg  Oral BID  . pantoprazole  40 mg Oral Daily  . sodium chloride flush  10 mL Intravenous Q12H  . vitamin B-12  1,000 mcg Oral Daily     Allergies:  Allergies  Allergen Reactions  . Penicillins     Has patient had a PCN reaction causing immediate rash, facial/tongue/throat swelling, SOB or lightheadedness with hypotension: NO Has patient  had a PCN reaction causing severe rash involving mucus membranes or skin necrosis: YES Has patient had a PCN reaction that required hospitalization: YES Has patient had a PCN reaction occurring within the last 10 years: NO If all of the above answers are "NO", then may proceed with Cephalosporin use.     Social History   Socioeconomic History  . Marital status: Married    Spouse name: Not on file  . Number of children: Not on file  . Years of education: Not on file  . Highest education level: Not on file  Occupational History  . Not on file  Tobacco Use  . Smoking status: Former Research scientist (life sciences)  . Smokeless tobacco: Never Used  Vaping Use  . Vaping Use: Never used  Substance and Sexual Activity  . Alcohol use: No  . Drug use: Not Currently  . Sexual activity: Not on file  Other Topics Concern  . Not on file  Social History Narrative  . Not on file   Social Determinants of Health   Financial Resource Strain: Not on file  Food Insecurity: Not on file  Transportation Needs: Not on file  Physical Activity: Not on file  Stress: Not on file  Social Connections: Not on file  Intimate Partner Violence: Not on file     History reviewed. No pertinent family history.   Review of Systems Positive for shortness of breath Negative for: General:  chills, fever, night sweats or weight changes.  Cardiovascular: PND orthopnea syncope dizziness  Dermatological skin lesions rashes Respiratory: Cough congestion Urologic: Frequent urination urination at night and hematuria Abdominal: negative for nausea, vomiting, diarrhea, bright red blood per rectum, melena, or hematemesis Neurologic: negative for visual changes, and/or hearing changes  All other systems reviewed and are otherwise negative except as noted above.  Labs: No results for input(s): CKTOTAL, CKMB, TROPONINI in the last 72 hours. Lab Results  Component Value Date   WBC 5.7 10/21/2020   HGB 11.1 (L) 10/21/2020   HCT 36.8 (L)  10/21/2020   MCV 82.0 10/21/2020   PLT 186 10/21/2020    Recent Labs  Lab 10/23/20 0455  NA 138  K 5.7*  CL 99  CO2 29  BUN 21  CREATININE 1.51*  CALCIUM 9.0  GLUCOSE 99   No results found for: CHOL, HDL, LDLCALC, TRIG No results found for: DDIMER  Radiology/Studies:  DG Chest 2 View  Result Date: 10/21/2020 CLINICAL DATA:  Shortness of breath with bilateral lower leg swelling. EXAM: CHEST - 2 VIEW COMPARISON:  June 21, 2018. FINDINGS: Small bilateral pleural effusions with mild overlying bibasilar opacities. No visible pneumothorax. Enlarged cardiac silhouette with central pulmonary vascular congestion. Aortic atherosclerosis. No acute osseous abnormality. Degenerative changes of the spine. IMPRESSION: Findings suggestive of congestive heart failure with cardiomegaly, central pulmonary vascular congestion, and small bilateral pleural effusions. Overlying mild bibasilar opacities are favored to represent atelectasis. Electronically Signed   By: Margaretha Sheffield MD   On: 10/21/2020 14:00   ECHOCARDIOGRAM COMPLETE  Result Date: 10/22/2020    ECHOCARDIOGRAM REPORT   Patient Name:   Karl Norman Date of  Exam: 10/21/2020 Medical Rec #:  [id number]    Height:       69.0 in Accession #:    [id number]   Weight:       212.0 lb Date of Birth:  03/06/1946   BSA:          2.118 m Patient Age:    58 years     BP:           101/75 mmHg Patient Gender: M            HR:           128 bpm. Exam Location:  [facility] Procedure: 2D Echo, Cardiac Doppler and Color Doppler Indications:     Dyspnea R06.00  History:         Patient has prior history of Echocardiogram examinations. CHF;                  Signs/Symptoms:Dyspnea.  Sonographer:     Alyse Low Roar Referring Phys:  [id number] AMY N COX Diagnosing Phys: Serafina Royals MD IMPRESSIONS  1. Left ventricular ejection fraction, by estimation, is 30 to 35%. The left ventricle has moderately decreased function. The left ventricle demonstrates global hypokinesis. The left  ventricular internal cavity size was moderately dilated. Left ventricular diastolic parameters are consistent with Grade III diastolic dysfunction (restrictive).  2. Right ventricular systolic function is moderately reduced. The right ventricular size is moderately enlarged.  3. Left atrial size was moderately dilated.  4. Right atrial size was moderately dilated.  5. 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  6. Tricuspid valve regurgitation is moderate to severe.  7. The aortic valve is normal in structure. Aortic valve regurgitation is mild. FINDINGS  Left Ventricle: Left ventricular ejection fraction, by estimation, is 30 to 35%. The left ventricle has moderately decreased function. The left ventricle demonstrates global hypokinesis. The left ventricular internal cavity size was moderately dilated. There is no left ventricular hypertrophy. Left ventricular diastolic parameters are consistent with Grade III diastolic dysfunction (restrictive). Right Ventricle: The right ventricular size is moderately enlarged. No increase in right ventricular wall thickness. Right ventricular systolic function is moderately reduced. Left Atrium: Left atrial size was moderately dilated. Right Atrium: Right atrial size was moderately dilated. Pericardium: There is no evidence of pericardial effusion. 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 Tricuspid Valve: The tricuspid valve is normal in structure. Tricuspid valve regurgitation is moderate to severe. Aortic Valve: The aortic valve is normal in structure. Aortic valve regurgitation is mild. Aortic valve peak gradient measures 5.6 mmHg. Pulmonic Valve: The pulmonic valve was normal in structure. Pulmonic valve regurgitation is mild. Aorta: The aortic root and ascending aorta are structurally normal, with no evidence of dilitation. IAS/Shunts: No atrial level shunt detected by color flow Doppler.  LEFT VENTRICLE PLAX 2D  LVIDd:         5.56 cm  Diastology LVIDs:         4.75 cm  LV e' medial:    9.03 cm/s LV PW:         1.08 cm  LV E/e' medial:  15.2 LV IVS:        1.20 cm  LV e' lateral:   15.40 cm/s LVOT diam:     2.00 cm  LV E/e' lateral: 8.9 LVOT Area:     3.14 cm  RIGHT VENTRICLE RV Mid diam:    4.98 cm RV S prime:     8.81 cm/s  TAPSE (M-mode): 1.4 cm LEFT ATRIUM              Index       RIGHT ATRIUM           Index LA diam:        4.60 cm  2.17 cm/m  RA Area:     29.60 cm LA Vol (A2C):   134.0 ml 63.27 ml/m RA Volume:   101.00 ml 47.69 ml/m LA Vol (A4C):   93.3 ml  44.05 ml/m LA Biplane Vol: 113.0 ml 53.36 ml/m  AORTIC VALVE                PULMONIC VALVE AV Area (Vmax): 1.89 cm    PV Vmax:        0.65 m/s AV Vmax:        118.00 cm/s PV Peak grad:   1.7 mmHg AV Peak Grad:   5.6 mmHg    RVOT Peak grad: 0 mmHg LVOT Vmax:      71.10 cm/s  AORTA Ao Root diam: 3.20 cm MITRAL VALVE                TRICUSPID VALVE MV Area (PHT): 7.37 cm     TR Peak grad:   21.5 mmHg MV Decel Time: 103 msec     TR Vmax:        232.00 cm/s MV E velocity: 137.00 cm/s                             SHUNTS                             Systemic Diam: 2.00 cm Serafina Royals MD Electronically signed by Serafina Royals MD Signature Date/Time: 10/22/2020/8:22:48 AM    Final     Telemetry atrial flutter with right bundle branch block and rapid ventricular rate  Weights: Filed Weights   10/21/20 1328 10/22/20 1503 10/23/20 0100  Weight: 96.2 kg 97.4 kg 98.9 kg     Physical Exam: Blood pressure 108/76, pulse (!) 124, temperature 98 F (36.7 C), temperature source Oral, resp. rate 16, height 5\' 9"  (1.753 m), weight 98.9 kg, SpO2 95 %. Body mass index is 32.19 kg/m. General: Well developed, well nourished, in no acute distress. Head eyes ears nose throat: Normocephalic, atraumatic, sclera non-icteric, no xanthomas, nares are without discharge. No apparent thyromegaly and/or mass  Lungs: Normal respiratory effort.  no wheezes, no rales, no rhonchi.   Heart: Rapid with normal S1 S2.  3-4+ apical murmur gallop, no rub, PMI is normal size and placement, carotid upstroke normal without bruit, jugular venous pressure is normal Abdomen: Soft, non-tender, non-distended with normoactive bowel sounds. No hepatomegaly. No rebound/guarding. No obvious abdominal masses. Abdominal aorta is normal size without bruit Extremities: 1+ edema. no cyanosis, no clubbing, no ulcers  Peripheral : 2+ bilateral upper extremity pulses, 2+ bilateral femoral pulses, 2+ bilateral dorsal pedal pulse Neuro: Alert and oriented. No facial asymmetry. No focal deficit. Moves all extremities spontaneously. Musculoskeletal: Normal muscle tone without kyphosis Psych:  Responds to questions appropriately with a normal affect.    Assessment: 75 year old male with valvular heart disease and acute on chronic systolic dysfunction congestive heart failure secondary to atrial flutter with rapid ventricular rate and no current evidence of acute coronary syndrome  Plan: 1.  Continue heart rate control if able with metoprolol and increasing doses at  this time with a goal heart rate below 110 bpm although this does not seem to be working fairly well at this time with concerns of hypotension 2.  Continuation of anticoagulation with enoxaparin for further risk reduction in stroke with atrial flutter.  And will switch to Eliquis 5 mg twice per day at this time 3.  Further consideration of transesophageal echocardiogram with electrical cardioversion to normal sinus rhythm as patient potentially improves from congestive heart failure.  Patient understands the risk and benefits of electrical cardioversion and transesophageal echocardiogram.  This includes the possibility of death stroke heart attack esophageal perforation and other rhythm disturbances and side effects of medication management and anesthesia.  The patient is at low risk for general anesthesia Plan for electrical cardioversion with  transesophageal echocardiogram tomorrow  Signed, Corey Skains M.D. [facility] 10/23/2020, 8:22 AM

## 2020-10-23 NOTE — Progress Notes (Addendum)
   Heart Failure Nurse Navigator Note  HFrEF  30-35%.  Grade 3 diastolic dysfunction.  Right ventricular systolic function is moderately reduced.  There are mitral regurgitation.  To severe tricuspid regurgitation.  Mild aortic insufficiency.  Previous echocardiogram in 2017 patient's ejection fraction was 50 to 55%.  He presented to the emergency room with increasing shortness of breath, leg swelling, PND and orthopnea.  He states that it seems like almost overnight he had gained 30 pounds.  He also had noted since Christmas time a decline in his energy level.   Comorbidities:  Hypertension Hyperlipidemia  Medications:  Apixaban 5 mg 2 times a day Furosemide 40 mg daily Metoprolol tartrate 50 mg 2 times a day  Labs:  Sodium 138, potassium 5.7, chloride 99, CO2 29, BUN 21 up from 16 of yesterday, creatinine 1.5 up from 1.25 up yesterday.  GFR 49. Intake 660 mL Output 400 mL Weight 97.4 down from 98.9 of yesterday. Blood pressure 108/76.   Assessment:  General-awake and alert, he is walking around his room difficulty.  HEENT-wears glasses-pupils are equal, nonicteric,  Cardiac-heart tones of regular rate and rhythm.  Chest-breath sounds are clear.  Abdomen-rounded soft nontender.  Musculoskeletal bilateral legs remain edematous and 2+ pitting.  Psych-is pleasant and appropriate, makes good eye contact.  Neurologic speech is clear.   Met with patient today.  States that he is still unable to to lie in bed as he feels like he is drowning when he lies down.  And did not sleep well last night due to that reason.  He also states that he does not feel like he is urinating a great amount.  Legs are still edematous but not as taunt as yesterday.  Remains in a flutter with ventricular rates in the 120s per the monitor.  States that his wife is a retired Therapist, sports and his daughter is a Designer, jewellery.  He is scheduled for TEE/direct-current cardioversion to be performed  tomorrow.  He had no further questions concerning heart failure.  We will continue to follow along.  Pricilla Riffle RN [facility]

## 2020-10-23 NOTE — Plan of Care (Signed)
  Problem: Activity: Goal: Capacity to carry out activities will improve Outcome: Progressing   Problem: Cardiac: Goal: Ability to achieve and maintain adequate cardiopulmonary perfusion will improve Outcome: Progressing   Problem: Clinical Measurements: Goal: Ability to maintain clinical measurements within normal limits will improve Outcome: Progressing

## 2020-10-23 NOTE — Progress Notes (Addendum)
PROGRESS NOTE    Karl Norman  RJJ:[id number] DOB: 12-21-1945 DOA: 10/21/2020 PCP: Idelle Crouch, MD   Brief Narrative: Taken from H&P Karl Norman is a 75 y.o. male with medical history significant for hypertension, GERD presents to the emergency department for shortness of breath.  He endorses worsening shortness of breath x 14 days and swelling of the lower extermities for 12 days. He reports that he drinks about 1 gallon of fluid per day (coffee and water). He states he has shortness of breath that is worst with exertion.  He denies chest pain, vision changes, dysuria, abdominal pain, hematuria, diarrhea, nausea, vomiting.  He denies syncopal episodes and or loss of consciousness.    He was hypoxic requiring BiPAP initially.  Elevated BNP at 661, EKG with atrial flutter and RVR, cardiology was consulted and he was started on IV diuresis along with metoprolol.  Now saturating well on room air. Continue to have a flutter with 2: 1 conduction, going for cardioversion tomorrow.  Repeat echocardiogram with reduced EF and biatrial of biventricular dilatation.  Subjective: Patient was sitting in chair and taking a nap.  Easily arousable.  Stating that his shortness of breath is improving and he was able to walk around without any difficulty.  Asking to take a shower.  Assessment & Plan:   Active Problems:   Heart failure (HCC)   Acute exacerbation of CHF (congestive heart failure) (HCC)  Acute systolic heart failure.  Echocardiogram with new diagnosis of reduced EF of 30 to 35%.  With dilated all chambers and global hypokinesis.  Grade 3 diastolic dysfunction.  Breathing status improved with diuresis. Cardiology is on board-appreciate their help -Patient will need ischemic work-up for new diagnosis of reduced EF, he had normal EF on prior echo was done in 2019-I will defer that decision to his cardiologist. -Switch IV Lasix with p.o. today as lungs were clear and there is a creatinine  bump.  Continue to have significant lower extremity edema. -Daily BMP and weight -Strict intake and output -We will add Xopenex breathing treatment as there was some wheeze today.  Atrial flutter with RVR.  Heart rate remained elevated. Cardiology started him on metoprolol and will consider cardioversion once little improved from heart failure standpoint. -Continue with Lovenox. -Continue with metoprolol  Hyperkalemia.  Potassium was listed at 5.7 during morning labs.  Repeat was within normal limit.  Patient was on IV Lasix. -Continue to monitor  GERD. -Continue with PPI  B12 deficiency. -Continue home supplement of B12  Hypertension.  Blood pressure within goal. He was on low-dose Lasix only at home. -Continue to monitor.  Objective: Vitals:   10/23/20 0100 10/23/20 0425 10/23/20 0753 10/23/20 1132  BP:  (!) 96/58 108/76 94/72  Pulse:  (!) 124 (!) 124 (!) 127  Resp:  18 16 16   Temp:  98 F (36.7 C) 98 F (36.7 C) 97.9 F (36.6 C)  TempSrc:   Oral   SpO2:  92% 95% 95%  Weight: 98.9 kg     Height:        Intake/Output Summary (Last 24 hours) at 10/23/2020 1433 Last data filed at 10/23/2020 0134 Gross per 24 hour  Intake 420 ml  Output 400 ml  Net 20 ml   Filed Weights   10/21/20 1328 10/22/20 1503 10/23/20 0100  Weight: 96.2 kg 97.4 kg 98.9 kg    Examination:  General.  Pleasant elderly man, in no acute distress. Pulmonary.  Some scattered wheeze bilaterally, normal  respiratory effort. CV.  Irregularly irregular with tachycardia Abdomen.  Soft, nontender, nondistended, BS positive. CNS.  Alert and oriented x3.  No focal neurologic deficit. Extremities.  1+ LE edema, no cyanosis, pulses intact and symmetrical. Psychiatry.  Judgment and insight appears normal.   DVT prophylaxis: Lovenox Code Status: Full Family Communication: Discussed with patient, talked with wife on phone and she was very concerned that patient is in a room with another roommate who is  comfort measure and multiple family members are keep going in and out, they were requesting more privacy and better environment for him as they were also concerned about Covid exposure, talked with the nursing supervisor and they will try helping him with that situation . Disposition Plan:  Status is: Inpatient  Remains inpatient appropriate because:Inpatient level of care appropriate due to severity of illness   Dispo: The patient is from: Home              Anticipated d/c is to: Home              Anticipated d/c date is: 2 days              Patient currently is not medically stable to d/c.   Difficult to place patient No              Level of care: Progressive Cardiac  Consultants:   Cardiology  Procedures:  Antimicrobials:   Data Reviewed: I have personally reviewed following labs and imaging studies  CBC: Recent Labs  Lab 10/21/20 1331  WBC 5.7  HGB 11.1*  HCT 36.8*  MCV 82.0  PLT [id number]   Basic Metabolic Panel: Recent Labs  Lab 10/21/20 1331 10/22/20 1000 10/23/20 0455 10/23/20 0841  NA 136 135 138 134*  K 5.8* 4.5 5.7* 4.5  CL 100 97* 99 97*  CO2 27 27 29 27   GLUCOSE 110* 91 99 112*  BUN 15 16 21 21   CREATININE 1.20 1.25* 1.51* 1.38*  CALCIUM 9.3 8.9 9.0 8.7*   GFR: Estimated Creatinine Clearance: 56.1 mL/min (A) (by C-G formula based on SCr of 1.38 mg/dL (H)). Liver Function Tests: No results for input(s): AST, ALT, ALKPHOS, BILITOT, PROT, ALBUMIN in the last 168 hours. No results for input(s): LIPASE, AMYLASE in the last 168 hours. No results for input(s): AMMONIA in the last 168 hours. Coagulation Profile: No results for input(s): INR, PROTIME in the last 168 hours. Cardiac Enzymes: No results for input(s): CKTOTAL, CKMB, CKMBINDEX, TROPONINI in the last 168 hours. BNP (last 3 results) No results for input(s): PROBNP in the last 8760 hours. HbA1C: No results for input(s): HGBA1C in the last 72 hours. CBG: No results for input(s): GLUCAP in the  last 168 hours. Lipid Profile: No results for input(s): CHOL, HDL, LDLCALC, TRIG, CHOLHDL, LDLDIRECT in the last 72 hours. Thyroid Function Tests: Recent Labs    10/22/20 0459  TSH 2.849   Anemia Panel: No results for input(s): VITAMINB12, FOLATE, FERRITIN, TIBC, IRON, RETICCTPCT in the last 72 hours. Sepsis Labs: No results for input(s): PROCALCITON, LATICACIDVEN in the last 168 hours.  Recent Results (from the past 240 hour(s))  SARS CORONAVIRUS 2 (TAT 6-24 HRS) Nasopharyngeal Nasopharyngeal Swab     Status: None   Collection Time: 10/21/20  6:09 PM   Specimen: Nasopharyngeal Swab  Result Value Ref Range Status   SARS Coronavirus 2 NEGATIVE NEGATIVE Final    Comment: (NOTE) SARS-CoV-2 target nucleic acids are NOT DETECTED.  The SARS-CoV-2 RNA is generally detectable  in upper and lower respiratory specimens during the acute phase of infection. Negative results do not preclude SARS-CoV-2 infection, do not rule out co-infections with other pathogens, and should not be used as the sole basis for treatment or other patient management decisions. Negative results must be combined with clinical observations, patient history, and epidemiological information. The expected result is Negative.  Fact Sheet for Patients: SugarRoll.be  Fact Sheet for Healthcare Providers: https://www.woods-mathews.com/  This test is not yet approved or cleared by the [place] FDA and  has been authorized for detection and/or diagnosis of SARS-CoV-2 by FDA under an Emergency Use Authorization (EUA). This EUA will remain  in effect (meaning this test can be used) for the duration of the COVID-19 declaration under Se ction 564(b)(1) of the Act, 21 U.S.C. section 360bbb-3(b)(1), unless the authorization is terminated or revoked sooner.  Performed at [facility], [place] [address] [place], [place] [address]      Radiology Studies: ECHOCARDIOGRAM  COMPLETE  Result Date: 10/22/2020    ECHOCARDIOGRAM REPORT   Patient Name:   KYLLIAN CLINGERMAN Date of Exam: 10/21/2020 Medical Rec #:  [id number]    Height:       69.0 in Accession #:    [id number]   Weight:       212.0 lb Date of Birth:  Oct 07, 1945   BSA:          2.118 m Patient Age:    19 years     BP:           101/75 mmHg Patient Gender: M            HR:           128 bpm. Exam Location:  [facility] Procedure: 2D Echo, Cardiac Doppler and Color Doppler Indications:     Dyspnea R06.00  History:         Patient has prior history of Echocardiogram examinations. CHF;                  Signs/Symptoms:Dyspnea.  Sonographer:     Alyse Low Roar Referring Phys:  [id number] AMY N COX Diagnosing Phys: Serafina Royals MD IMPRESSIONS  1. Left ventricular ejection fraction, by estimation, is 30 to 35%. The left ventricle has moderately decreased function. The left ventricle demonstrates global hypokinesis. The left ventricular internal cavity size was moderately dilated. Left ventricular diastolic parameters are consistent with Grade III diastolic dysfunction (restrictive).  2. Right ventricular systolic function is moderately reduced. The right ventricular size is moderately enlarged.  3. Left atrial size was moderately dilated.  4. Right atrial size was moderately dilated.  5. 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  6. Tricuspid valve regurgitation is moderate to severe.  7. The aortic valve is normal in structure. Aortic valve regurgitation is mild. FINDINGS  Left Ventricle: Left ventricular ejection fraction, by estimation, is 30 to 35%. The left ventricle has moderately decreased function. The left ventricle demonstrates global hypokinesis. The left ventricular internal cavity size was moderately dilated. There is no left ventricular hypertrophy. Left ventricular diastolic parameters are consistent with Grade III diastolic dysfunction (restrictive). Right Ventricle: The right ventricular size is moderately  enlarged. No increase in right ventricular wall thickness. Right ventricular systolic function is moderately reduced. Left Atrium: Left atrial size was moderately dilated. Right Atrium: Right atrial size was moderately dilated. Pericardium: There is no evidence of pericardial effusion. 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 Tricuspid Valve: The  tricuspid valve is normal in structure. Tricuspid valve regurgitation is moderate to severe. Aortic Valve: The aortic valve is normal in structure. Aortic valve regurgitation is mild. Aortic valve peak gradient measures 5.6 mmHg. Pulmonic Valve: The pulmonic valve was normal in structure. Pulmonic valve regurgitation is mild. Aorta: The aortic root and ascending aorta are structurally normal, with no evidence of dilitation. IAS/Shunts: No atrial level shunt detected by color flow Doppler.  LEFT VENTRICLE PLAX 2D LVIDd:         5.56 cm  Diastology LVIDs:         4.75 cm  LV e' medial:    9.03 cm/s LV PW:         1.08 cm  LV E/e' medial:  15.2 LV IVS:        1.20 cm  LV e' lateral:   15.40 cm/s LVOT diam:     2.00 cm  LV E/e' lateral: 8.9 LVOT Area:     3.14 cm  RIGHT VENTRICLE RV Mid diam:    4.98 cm RV S prime:     8.81 cm/s TAPSE (M-mode): 1.4 cm LEFT ATRIUM              Index       RIGHT ATRIUM           Index LA diam:        4.60 cm  2.17 cm/m  RA Area:     29.60 cm LA Vol (A2C):   134.0 ml 63.27 ml/m RA Volume:   101.00 ml 47.69 ml/m LA Vol (A4C):   93.3 ml  44.05 ml/m LA Biplane Vol: 113.0 ml 53.36 ml/m  AORTIC VALVE                PULMONIC VALVE AV Area (Vmax): 1.89 cm    PV Vmax:        0.65 m/s AV Vmax:        118.00 cm/s PV Peak grad:   1.7 mmHg AV Peak Grad:   5.6 mmHg    RVOT Peak grad: 0 mmHg LVOT Vmax:      71.10 cm/s  AORTA Ao Root diam: 3.20 cm MITRAL VALVE                TRICUSPID VALVE MV Area (PHT): 7.37 cm     TR Peak grad:   21.5 mmHg MV Decel Time: 103 msec     TR Vmax:        232.00 cm/s MV E velocity:  137.00 cm/s                             SHUNTS                             Systemic Diam: 2.00 cm Serafina Royals MD Electronically signed by Serafina Royals MD Signature Date/Time: 10/22/2020/8:22:48 AM    Final     Scheduled Meds: . apixaban  5 mg Oral BID  . furosemide  40 mg Oral Daily  . levalbuterol  1.25 mg Nebulization Q6H  . metoprolol tartrate  50 mg Oral BID  . pantoprazole  40 mg Oral Daily  . sodium chloride flush  10 mL Intravenous Q12H  . vitamin B-12  1,000 mcg Oral Daily   Continuous Infusions:   LOS: 1 day   Time spent: 30 minutes.  Lorella Nimrod, MD [facility]  If  7PM-7AM, please contact night-coverage [email or web address]  10/23/2020, 2:33 PM   This record has been created using Systems analyst. Errors have been sought and corrected,but may not always be located. Such creation errors do not reflect on the standard of care.

## 2020-10-24 ENCOUNTER — Other Ambulatory Visit: Payer: Self-pay

## 2020-10-24 ENCOUNTER — Inpatient Hospital Stay: Payer: Medicare Other | Admitting: Anesthesiology

## 2020-10-24 ENCOUNTER — Inpatient Hospital Stay
Admit: 2020-10-24 | Discharge: 2020-10-24 | Disposition: A | Discharge status: Home / Self Care | Payer: Medicare Other | Attending: Internal Medicine | Admitting: Internal Medicine

## 2020-10-24 ENCOUNTER — Encounter
Admission: EM | Disposition: A | Discharge status: Home / Self Care | Payer: Self-pay | Source: Home / Self Care | Attending: Internal Medicine

## 2020-10-24 HISTORY — PX: CARDIOVERSION: SHX1299

## 2020-10-24 HISTORY — PX: TEE WITHOUT CARDIOVERSION: SHX5443

## 2020-10-24 LAB — BASIC METABOLIC PANEL
Anion gap: 9 (ref 5–15)
Anion gap: 9 (ref 5–15)
BUN: 29 mg/dL — ABNORMAL HIGH (ref 8–23)
BUN: 27 mg/dL — ABNORMAL HIGH (ref 8–23)
CO2: 28 mmol/L (ref 22–32)
CO2: 28 mmol/L (ref 22–32)
Calcium: 9 mg/dL (ref 8.9–10.3)
Calcium: 8.8 mg/dL — ABNORMAL LOW (ref 8.9–10.3)
Chloride: 101 mmol/L (ref 98–111)
Chloride: 98 mmol/L (ref 98–111)
Creatinine, Ser: 1.61 mg/dL — ABNORMAL HIGH (ref 0.61–1.24)
Creatinine, Ser: 1.44 mg/dL — ABNORMAL HIGH (ref 0.61–1.24)
GFR, Estimated: 45 mL/min — ABNORMAL LOW (ref >60)
GFR, Estimated: 52 mL/min — ABNORMAL LOW (ref >60)
Glucose, Bld: 110 mg/dL — ABNORMAL HIGH (ref 70–99)
Glucose, Bld: 108 mg/dL — ABNORMAL HIGH (ref 70–99)
Potassium: 6.4 mmol/L (ref 3.5–5.1)
Potassium: 5 mmol/L (ref 3.5–5.1)
Sodium: 138 mmol/L (ref 135–145)
Sodium: 135 mmol/L (ref 135–145)

## 2020-10-24 LAB — PROTIME-INR
INR: 1.3 — ABNORMAL HIGH (ref 0.8–1.2)
Prothrombin Time: 15.5 seconds — ABNORMAL HIGH (ref 11.4–15.2)

## 2020-10-24 SURGERY — CARDIOVERSION
Anesthesia: General

## 2020-10-24 MED ORDER — PROPOFOL 500 MG/50ML IV EMUL
INTRAVENOUS | Status: DC | PRN
  Administered 2020-10-24: 100 mg via INTRAVENOUS
  Administered 2020-10-24: 50 mg via INTRAVENOUS

## 2020-10-24 MED ORDER — DEXTROSE 50 % IV SOLN: 1.0000 | Freq: Once | INTRAVENOUS | Status: DC

## 2020-10-24 MED ORDER — PROPOFOL 10 MG/ML IV BOLUS
INTRAVENOUS | Status: AC
  Filled 2020-10-24: qty 20

## 2020-10-24 MED ORDER — METOPROLOL TARTRATE 25 MG PO TABS
25.0000 mg | ORAL_TABLET | Freq: Two times a day (BID) | ORAL | Status: DC
  Administered 2020-10-24 – 2020-10-25 (×2): 25 mg via ORAL
  Filled 2020-10-24 (×2): qty 1

## 2020-10-24 MED ORDER — PHENYLEPHRINE HCL (PRESSORS) 10 MG/ML IV SOLN
INTRAVENOUS | Status: DC | PRN
  Administered 2020-10-24 (×2): 100 ug via INTRAVENOUS

## 2020-10-24 MED ORDER — INSULIN ASPART 100 UNIT/ML ~~LOC~~ SOLN
10.0000 [IU] | Freq: Once | SUBCUTANEOUS | Status: DC
  Filled 2020-10-24 (×2): qty 0.1

## 2020-10-24 MED ORDER — BUTAMBEN-TETRACAINE-BENZOCAINE 2-2-14 % EX AERO
INHALATION_SPRAY | CUTANEOUS | Status: AC
  Filled 2020-10-24: qty 5

## 2020-10-24 MED ORDER — CALCIUM GLUCONATE-NACL 1-0.675 GM/50ML-% IV SOLN
1.0000 g | Freq: Once | INTRAVENOUS | Status: DC
  Filled 2020-10-24: qty 50

## 2020-10-24 MED ORDER — LEVALBUTEROL HCL 1.25 MG/0.5ML IN NEBU
1.2500 mg | INHALATION_SOLUTION | Freq: Three times a day (TID) | RESPIRATORY_TRACT | Status: DC
  Administered 2020-10-24 – 2020-10-25 (×3): 1.25 mg via RESPIRATORY_TRACT
  Filled 2020-10-24 (×6): qty 0.5

## 2020-10-24 MED ORDER — LIDOCAINE VISCOUS HCL 2 % MT SOLN
OROMUCOSAL | Status: AC
  Filled 2020-10-24: qty 15

## 2020-10-24 NOTE — Progress Notes (Signed)
[facility] progress note  Patient ID: Karl Norman, MRN: [id number], DOB/AGE: 73-Feb-1947 73 y.o. Admit date: 10/21/2020   Date of Consult: 10/24/2020    2/1.  Patient overall doing much better today.  He feels well with no evidence of chest pain or significant shortness of breath.  The patient has had significant urine output and lower extremities and pulmonary edema slightly improved.  The patient remains in atrial flutter with 2-1 block at 139 bpm.  We have discussed at length that the patient may benefit from electrical cardioversion due to medication management not typically helping with atrial flutter very well.  He is the understanding of this may work  2/2.  Patient is ambulating in the hall without evidence of significant congestive heart failure type symptoms angina or other concerns.  Patient does have continued atrial flutter at 128bpm and unchanged by medication management increases including metoprolol.  We have discussed that atrial flutter is not typically sensitive to these medication management or antiarrhythmics and electrical cardioversion may be more appropriate.  He has agreed to this although we have discussed the possibility of anticoagulation for risk reduction in stroke with atrial fibrillation as well as transesophageal echocardiogram to assess for left atrial appendage thrombus prior to electrical cardioversion.  He understands all the risks and benefits of these treatments and agrees at this time.  He is at low risk for general anesthesia  2/3.  Patient tolerated transesophageal echocardiogram showing moderate to severe systolic dysfunction congestive heart failure with ejection fraction of 25 to 30%.  He had moderate to severe biatrial enlargement with moderate to severe mitral and tricuspid regurgitation.  There was no evidence of spontaneous contrast and no evidence of atrial thrombus and/or atrial appendage thrombus.  The patient also had moderate aortic  atherosclerosis and a patent foramen ovale.  He therefore underwent a electrical cardioversion of atrial flutter with rapid ventricular rate to normal sinus rhythm and has tolerated it well.  No further complication.  Echocardiogram showing no severe LV systolic dysfunction with ejection fraction of 25% with moderate to severe mitral and tricuspid regurgitation with biatrial enlargement and biventricular enlargement.   Past Medical History:  Diagnosis Date  . CHF (congestive heart failure) ([place])    had three tubes to drain heart 2011  . COPD (chronic obstructive pulmonary disease) ([place])   . Dyspnea    with exertion  . GERD (gastroesophageal reflux disease)   . 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 Pancreatitis       Surgical History:  Past Surgical History:  Procedure Laterality Date  . COLONOSCOPY WITH PROPOFOL N/A 08/15/2018   Procedure: COLONOSCOPY WITH PROPOFOL;  Surgeon: Manya Silvas, MD;  Location: [facility] ENDOSCOPY;  Service: Endoscopy;  Laterality: N/A;  . ERCP with stent  07/22/2018  . REPLACEMENT TOTAL KNEE    . TEE WITHOUT CARDIOVERSION N/A 11/11/2015   Procedure: TRANSESOPHAGEAL ECHOCARDIOGRAM (TEE);  Surgeon: Corey Skains, MD;  Location: [facility] ORS;  Service: Cardiovascular;  Laterality: N/A;     Home Meds: Prior to Admission medications   Medication Sig Start Date End Date Taking? Authorizing Provider  b complex vitamins capsule Take 1 capsule by mouth daily.   Yes [provider]  furosemide (LASIX) 20 MG tablet Take 20 mg by mouth daily. 10/08/20  Yes [provider]  ibuprofen (ADVIL) 200 MG tablet Take 400 mg by mouth every 6 (six) hours as needed for mild pain.  Yes [provider]  omeprazole (PRILOSEC) 20 MG capsule Take 20 mg by mouth daily.   Yes [provider]  simethicone (MYLICON) 80 MG chewable tablet Chew 80 mg by mouth every 6 (six) hours as needed for flatulence.    Yes [provider]  vitamin B-12 (CYANOCOBALAMIN) 1000 MCG tablet Take 1,000 mcg by mouth daily.   Yes [provider]    Inpatient Medications:  . [MAR Hold] apixaban  5 mg Oral BID  . [MAR Hold] furosemide  40 mg Oral Daily  . [MAR Hold] levalbuterol  1.25 mg Nebulization TID  . [MAR Hold] metoprolol tartrate  50 mg Oral BID  . [MAR Hold] pantoprazole  40 mg Oral Daily  . [MAR Hold] sodium chloride flush  10 mL Intravenous Q12H  . [MAR Hold] vitamin B-12  1,000 mcg Oral Daily   . sodium chloride      Allergies:  Allergies  Allergen Reactions  . Penicillins     Has patient had a PCN reaction causing immediate rash, facial/tongue/throat swelling, SOB or lightheadedness with hypotension: NO Has patient had a PCN reaction causing severe rash involving mucus membranes or skin necrosis: YES Has patient had a PCN reaction that required hospitalization: YES Has patient had a PCN reaction occurring within the last 10 years: NO If all of the above answers are "NO", then may proceed with Cephalosporin use.     Social History   Socioeconomic History  . Marital status: Married    Spouse name: Not on file  . Number of children: Not on file  . Years of education: Not on file  . Highest education level: Not on file  Occupational History  . Not on file  Tobacco Use  . Smoking status: Former Research scientist (life sciences)  . Smokeless tobacco: Never Used  Vaping Use  . Vaping Use: Never used  Substance and Sexual Activity  . Alcohol use: No  . Drug use: Not Currently  . Sexual activity: Not on file  Other Topics Concern  . Not on file  Social History Narrative  . Not on file   Social Determinants of Health   Financial Resource Strain: Not on file  Food Insecurity: Not on file  Transportation Needs: Not on file  Physical Activity: Not on file  Stress: Not on file  Social Connections: Not on file  Intimate Partner Violence: Not on file     History reviewed. No pertinent family  history.   Review of Systems Positive for shortness of breath Negative for: General:  chills, fever, night sweats or weight changes.  Cardiovascular: PND orthopnea syncope dizziness  Dermatological skin lesions rashes Respiratory: Cough congestion Urologic: Frequent urination urination at night and hematuria Abdominal: negative for nausea, vomiting, diarrhea, bright red blood per rectum, melena, or hematemesis Neurologic: negative for visual changes, and/or hearing changes  All other systems reviewed and are otherwise negative except as noted above.  Labs: No results for input(s): CKTOTAL, CKMB, TROPONINI in the last 72 hours. Lab Results  Component Value Date   WBC 5.7 10/21/2020   HGB 11.1 (L) 10/21/2020   HCT 36.8 (L) 10/21/2020   MCV 82.0 10/21/2020   PLT 186 10/21/2020    Recent Labs  Lab 10/24/20 0555  NA 135  K 5.0  CL 98  CO2 28  BUN 27*  CREATININE 1.44*  CALCIUM 8.8*  GLUCOSE 108*   No results found for: CHOL, HDL, LDLCALC, TRIG No results found for: DDIMER  Radiology/Studies:  DG Chest 2  View  Result Date: 10/21/2020 CLINICAL DATA:  Shortness of breath with bilateral lower leg swelling. EXAM: CHEST - 2 VIEW COMPARISON:  June 21, 2018. FINDINGS: Small bilateral pleural effusions with mild overlying bibasilar opacities. No visible pneumothorax. Enlarged cardiac silhouette with central pulmonary vascular congestion. Aortic atherosclerosis. No acute osseous abnormality. Degenerative changes of the spine. IMPRESSION: Findings suggestive of congestive heart failure with cardiomegaly, central pulmonary vascular congestion, and small bilateral pleural effusions. Overlying mild bibasilar opacities are favored to represent atelectasis. Electronically Signed   By: Margaretha Sheffield MD   On: 10/21/2020 14:00   ECHOCARDIOGRAM COMPLETE  Result Date: 10/22/2020    ECHOCARDIOGRAM REPORT   Patient Name:   Karl Norman Date of Exam: 10/21/2020 Medical Rec #:  [id number]     Height:       69.0 in Accession #:    [id number]   Weight:       212.0 lb Date of Birth:  1946/09/20   BSA:          2.118 m Patient Age:    14 years     BP:           101/75 mmHg Patient Gender: M            HR:           128 bpm. Exam Location:  [facility] Procedure: 2D Echo, Cardiac Doppler and Color Doppler Indications:     Dyspnea R06.00  History:         Patient has prior history of Echocardiogram examinations. CHF;                  Signs/Symptoms:Dyspnea.  Sonographer:     Alyse Low Roar Referring Phys:  [id number] AMY N COX Diagnosing Phys: Serafina Royals MD IMPRESSIONS  1. Left ventricular ejection fraction, by estimation, is 30 to 35%. The left ventricle has moderately decreased function. The left ventricle demonstrates global hypokinesis. The left ventricular internal cavity size was moderately dilated. Left ventricular diastolic parameters are consistent with Grade III diastolic dysfunction (restrictive).  2. Right ventricular systolic function is moderately reduced. The right ventricular size is moderately enlarged.  3. Left atrial size was moderately dilated.  4. Right atrial size was moderately dilated.  5. 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  6. Tricuspid valve regurgitation is moderate to severe.  7. The aortic valve is normal in structure. Aortic valve regurgitation is mild. FINDINGS  Left Ventricle: Left ventricular ejection fraction, by estimation, is 30 to 35%. The left ventricle has moderately decreased function. The left ventricle demonstrates global hypokinesis. The left ventricular internal cavity size was moderately dilated. There is no left ventricular hypertrophy. Left ventricular diastolic parameters are consistent with Grade III diastolic dysfunction (restrictive). Right Ventricle: The right ventricular size is moderately enlarged. No increase in right ventricular wall thickness. Right ventricular systolic function is moderately reduced. Left Atrium: Left atrial size  was moderately dilated. Right Atrium: Right atrial size was moderately dilated. Pericardium: There is no evidence of pericardial effusion. 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 Tricuspid Valve: The tricuspid valve is normal in structure. Tricuspid valve regurgitation is moderate to severe. Aortic Valve: The aortic valve is normal in structure. Aortic valve regurgitation is mild. Aortic valve peak gradient measures 5.6 mmHg. Pulmonic Valve: The pulmonic valve was normal in structure. Pulmonic valve regurgitation is mild. Aorta: The aortic root and ascending aorta are structurally normal, with no evidence of dilitation. IAS/Shunts: No atrial  level shunt detected by color flow Doppler.  LEFT VENTRICLE PLAX 2D LVIDd:         5.56 cm  Diastology LVIDs:         4.75 cm  LV e' medial:    9.03 cm/s LV PW:         1.08 cm  LV E/e' medial:  15.2 LV IVS:        1.20 cm  LV e' lateral:   15.40 cm/s LVOT diam:     2.00 cm  LV E/e' lateral: 8.9 LVOT Area:     3.14 cm  RIGHT VENTRICLE RV Mid diam:    4.98 cm RV S prime:     8.81 cm/s TAPSE (M-mode): 1.4 cm LEFT ATRIUM              Index       RIGHT ATRIUM           Index LA diam:        4.60 cm  2.17 cm/m  RA Area:     29.60 cm LA Vol (A2C):   134.0 ml 63.27 ml/m RA Volume:   101.00 ml 47.69 ml/m LA Vol (A4C):   93.3 ml  44.05 ml/m LA Biplane Vol: 113.0 ml 53.36 ml/m  AORTIC VALVE                PULMONIC VALVE AV Area (Vmax): 1.89 cm    PV Vmax:        0.65 m/s AV Vmax:        118.00 cm/s PV Peak grad:   1.7 mmHg AV Peak Grad:   5.6 mmHg    RVOT Peak grad: 0 mmHg LVOT Vmax:      71.10 cm/s  AORTA Ao Root diam: 3.20 cm MITRAL VALVE                TRICUSPID VALVE MV Area (PHT): 7.37 cm     TR Peak grad:   21.5 mmHg MV Decel Time: 103 msec     TR Vmax:        232.00 cm/s MV E velocity: 137.00 cm/s                             SHUNTS                             Systemic Diam: 2.00 cm Serafina Royals MD Electronically signed by Serafina Royals MD Signature Date/Time: 10/22/2020/8:22:48 AM    Final     Telemetry atrial flutter with right bundle branch block and rapid ventricular rate  Weights: Filed Weights   10/23/20 0100 10/24/20 0400 10/24/20 1100  Weight: 98.9 kg 100.9 kg 96.2 kg     Physical Exam: Blood pressure (!) 86/58, pulse 86, temperature 98.2 F (36.8 C), temperature source Oral, resp. rate 18, height 5\' 9"  (1.753 m), weight 96.2 kg, SpO2 97 %. Body mass index is 31.31 kg/m. General: Well developed, well nourished, in no acute distress. Head eyes ears nose throat: Normocephalic, atraumatic, sclera non-icteric, no xanthomas, nares are without discharge. No apparent thyromegaly and/or mass  Lungs: Normal respiratory effort.  no wheezes, no rales, no rhonchi.  Heart: Regular with normal S1 S2.  3-4+ apical murmur gallop, no rub, PMI is normal size and placement, carotid upstroke normal without bruit, jugular venous pressure is normal Abdomen: Soft, non-tender, non-distended with normoactive bowel  sounds. No hepatomegaly. No rebound/guarding. No obvious abdominal masses. Abdominal aorta is normal size without bruit Extremities: 1+ edema. no cyanosis, no clubbing, no ulcers  Peripheral : 2+ bilateral upper extremity pulses, 2+ bilateral femoral pulses, 2+ bilateral dorsal pedal pulse Neuro: Alert and oriented. No facial asymmetry. No focal deficit. Moves all extremities spontaneously. Musculoskeletal: Normal muscle tone without kyphosis Psych:  Responds to questions appropriately with a normal affect.    Assessment: 75 year old male with valvular heart disease and acute on chronic systolic dysfunction congestive heart failure secondary to atrial flutter with rapid ventricular rate and no current evidence of acute coronary syndrome Now status post electrical cardioversion of atrial flutter to normal sinus rhythm Plan: 1.  Continue metoprolol at 25 mg twice per day although will ambulate before reinstatement of  this medication due to electrical cardioversion of normal sinus rhythm 2.  Anticoagulation for further risk reduction of stroke with atrial fibrillation with Eliquis 5 mg twice per day 3.  No further cardiac intervention and/or diagnostics necessary at this time 4.  Continue 40 mg of oral Lasix for congestive heart failure and continued edema 5.  If ambulating well this afternoon and feeling well okay for discharge home from cardiac standpoint and follow-up next week for further adjustments of medication management. Signed, Corey Skains M.D. [facility] 10/24/2020, 1:04 PM

## 2020-10-24 NOTE — CV Procedure (Signed)
Electrical Cardioversion Procedure Note ALLYN BERTONI [id number] [birth year]  Procedure: Electrical Cardioversion Indications:  paroxysmal non valvular atrial flutter  Procedure Details Consent: Risks of procedure as well as the alternatives and risks of each were explained to the (patient/caregiver).  Consent for procedure obtained. Time Out: Verified patient identification, verified procedure, site/side was marked, verified correct patient position, special equipment/implants available, medications/allergies/relevent history reviewed, required imaging and test results available.  Performed  Patient placed on cardiac monitor, pulse oximetry, supplemental oxygen as necessary.  Sedation given: Propofol and versed as per anesthesia  Pacer pads placed anterior and posterior chest.  Cardioverted 1 time(s).  Cardioverted at 120J.  Evaluation Findings: Post procedure EKG shows: NSR Complications: None Patient did tolerate procedure well.   Serafina Royals M.D. [facility] 10/24/2020, 11:56 AM

## 2020-10-24 NOTE — Progress Notes (Signed)
PROGRESS NOTE    Karl Norman  [id number] DOB: April 24, 1946 DOA: 10/21/2020 PCP: Idelle Crouch, MD   Brief Narrative: Taken from H&P Karl Norman is a 75 y.o. male with medical history significant for hypertension, GERD presents to the emergency department for shortness of breath.  He endorses worsening shortness of breath x 14 days and swelling of the lower extermities for 12 days. He reports that he drinks about 1 gallon of fluid per day (coffee and water). He states he has shortness of breath that is worst with exertion.  He denies chest pain, vision changes, dysuria, abdominal pain, hematuria, diarrhea, nausea, vomiting.  He denies syncopal episodes and or loss of consciousness.    He was hypoxic requiring BiPAP initially.  Elevated BNP at 661, EKG with atrial flutter and RVR, cardiology was consulted and he was started on IV diuresis along with metoprolol.  Now saturating well on room air. Continue to have a flutter with 2: 1 conduction, going for cardioversion tomorrow.  Repeat echocardiogram with reduced EF and biatrial of biventricular dilatation.  Underwent transesophageal echocardiogram today which was with low EF at 25 to 30% and no obvious thrombus.  He also underwent successful cardioversion for atrial flutter.  Can be discharged tomorrow if cleared from cardiology and remained in sinus rhythm.  Subjective: Patient denies any more shortness of breath, he was sitting in chair and waiting for his cardioversion.  Wants to go home tomorrow.  Assessment & Plan:   Active Problems:   Heart failure (HCC)   Acute exacerbation of CHF (congestive heart failure) (HCC)  Acute systolic heart failure.  Echocardiogram with new diagnosis of reduced EF of 30 to 35%.  With dilated all chambers and global hypokinesis.  Grade 3 diastolic dysfunction.  Breathing status improved with diuresis. Cardiology is on board-appreciate their help -Patient will need ischemic work-up for new  diagnosis of reduced EF, he had normal EF on prior echo was done in 2019-I will defer that decision to his cardiologist. -He was switched to p.o. Lasix, dose was increased to 40 mg daily instead of his home dose of 20. -Daily BMP and weight -Strict intake and output -Continue Xopenex breathing treatment as there was some wheeze today.  Atrial flutter with RVR.  Heart rate remained elevated. Per cardiology note he underwent successful cardioversion today. -Continue with Lovenox. -Continue with metoprolol -Continue to monitor  Hyperkalemia.  Early morning labs again show elevated potassium at 6.4 and it was 5 on repeat check. -Continue to monitor  GERD. -Continue with PPI  B12 deficiency. -Continue home supplement of B12  Hypertension.  Blood pressure within goal. He was on low-dose Lasix only at home. -Continue to monitor.  Objective: Vitals:   10/24/20 1230 10/24/20 1245 10/24/20 1256 10/24/20 1332  BP: (!) 87/65 (!) 89/64 (!) 86/58 96/65  Pulse: 80 80 86 80  Resp: 17 (!) 21 18 18   Temp:    97.7 F (36.5 C)  TempSrc:      SpO2: 99% 98% 97% 93%  Weight:      Height:        Intake/Output Summary (Last 24 hours) at 10/24/2020 1508 Last data filed at 10/24/2020 1001 Gross per 24 hour  Intake --  Output 520 ml  Net -520 ml   Filed Weights   10/23/20 0100 10/24/20 0400 10/24/20 1100  Weight: 98.9 kg 100.9 kg 96.2 kg    Examination:  General.  Well-developed elderly man, in no acute distress. Pulmonary.  Lungs  clear bilaterally, normal respiratory effort. CV.  Irregularly irregular with tachycardia Abdomen.  Soft, nontender, nondistended, BS positive. CNS.  Alert and oriented x3.  No focal neurologic deficit. Extremities.  No edema, no cyanosis, pulses intact and symmetrical. Psychiatry.  Judgment and insight appears normal.  DVT prophylaxis: Lovenox Code Status: Full Family Communication: Discussed with patient Disposition Plan:  Status is: Inpatient  Remains  inpatient appropriate because:Inpatient level of care appropriate due to severity of illness   Dispo: The patient is from: Home              Anticipated d/c is to: Home              Anticipated d/c date is: 1 day              Patient currently is not medically stable to d/c.   Difficult to place patient No              Level of care: Progressive Cardiac  Consultants:   Cardiology  Procedures:  Antimicrobials:   Data Reviewed: I have personally reviewed following labs and imaging studies  CBC: Recent Labs  Lab 10/21/20 1331  WBC 5.7  HGB 11.1*  HCT 36.8*  MCV 82.0  PLT 99991111   Basic Metabolic Panel: Recent Labs  Lab 10/22/20 1000 10/23/20 0455 10/23/20 0841 10/24/20 0359 10/24/20 0555  NA 135 138 134* 138 135  K 4.5 5.7* 4.5 6.4* 5.0  CL 97* 99 97* 101 98  CO2 27 29 27 28 28   GLUCOSE 91 99 112* 110* 108*  BUN 16 21 21  29* 27*  CREATININE 1.25* 1.51* 1.38* 1.61* 1.44*  CALCIUM 8.9 9.0 8.7* 9.0 8.8*   GFR: Estimated Creatinine Clearance: 53.1 mL/min (A) (by C-G formula based on SCr of 1.44 mg/dL (H)). Liver Function Tests: No results for input(s): AST, ALT, ALKPHOS, BILITOT, PROT, ALBUMIN in the last 168 hours. No results for input(s): LIPASE, AMYLASE in the last 168 hours. No results for input(s): AMMONIA in the last 168 hours. Coagulation Profile: Recent Labs  Lab 10/24/20 0809  INR 1.3*   Cardiac Enzymes: No results for input(s): CKTOTAL, CKMB, CKMBINDEX, TROPONINI in the last 168 hours. BNP (last 3 results) No results for input(s): PROBNP in the last 8760 hours. HbA1C: No results for input(s): HGBA1C in the last 72 hours. CBG: No results for input(s): GLUCAP in the last 168 hours. Lipid Profile: No results for input(s): CHOL, HDL, LDLCALC, TRIG, CHOLHDL, LDLDIRECT in the last 72 hours. Thyroid Function Tests: Recent Labs    10/22/20 0459  TSH 2.849   Anemia Panel: No results for input(s): VITAMINB12, FOLATE, FERRITIN, TIBC, IRON, RETICCTPCT in  the last 72 hours. Sepsis Labs: No results for input(s): PROCALCITON, LATICACIDVEN in the last 168 hours.  Recent Results (from the past 240 hour(s))  SARS CORONAVIRUS 2 (TAT 6-24 HRS) Nasopharyngeal Nasopharyngeal Swab     Status: None   Collection Time: 10/21/20  6:09 PM   Specimen: Nasopharyngeal Swab  Result Value Ref Range Status   SARS Coronavirus 2 NEGATIVE NEGATIVE Final    Comment: (NOTE) SARS-CoV-2 target nucleic acids are NOT DETECTED.  The SARS-CoV-2 RNA is generally detectable in upper and lower respiratory specimens during the acute phase of infection. Negative results do not preclude SARS-CoV-2 infection, do not rule out co-infections with other pathogens, and should not be used as the sole basis for treatment or other patient management decisions. Negative results must be combined with clinical observations, patient history, and epidemiological  information. The expected result is Negative.  Fact Sheet for Patients: SugarRoll.be  Fact Sheet for Healthcare Providers: https://www.woods-mathews.com/  This test is not yet approved or cleared by the [place] FDA and  has been authorized for detection and/or diagnosis of SARS-CoV-2 by FDA under an Emergency Use Authorization (EUA). This EUA will remain  in effect (meaning this test can be used) for the duration of the COVID-19 declaration under Se ction 564(b)(1) of the Act, 21 U.S.C. section 360bbb-3(b)(1), unless the authorization is terminated or revoked sooner.  Performed at [facility], [place] [address], [place], [place] [address]      Radiology Studies: ECHO TEE  Result Date: 10/24/2020    TRANSESOPHOGEAL ECHO REPORT   Patient Name:   Karl Norman Date of Exam: 10/24/2020 Medical Rec #:  [id number]    Height:       69.0 in Accession #:    [id number]   Weight:       212.0 lb Date of Birth:  [birth year]   BSA:          2.118 m Patient Age:    34 years     BP:            107/80 mmHg Patient Gender: M            HR:           129 bpm. Exam Location:  [facility] Procedure: Transesophageal Echo, Cardiac Doppler and Color Doppler Indications:     Not listed on TEE check-in sheet  History:         Patient has prior history of Echocardiogram examinations, most                  recent 10/21/2020. CHF, COPD; Signs/Symptoms:Dyspnea.  Sonographer:     Sherrie Sport RDCS (AE) Referring Phys:  [place] Diagnosing Phys: Serafina Royals MD PROCEDURE: The transesophogeal probe was passed without difficulty through the esophogus of the patient. Sedation performed by performing physician. The patient developed no complications during the procedure. A successful direct current cardioversion was performed at 120 joules with 1 attempt. IMPRESSIONS  1. Left ventricular ejection fraction, by estimation, is 25 to 30%. The left ventricle has severely decreased function. The left ventricle demonstrates global hypokinesis. The left ventricular internal cavity size was mildly dilated.  2. Right ventricular systolic function is normal. The right ventricular size is normal.  3. Left atrial size was moderately dilated. No left atrial/left atrial appendage thrombus was detected.  4. Right atrial size was moderately dilated.  5. The mitral valve is abnormal. 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  6. Tricuspid valve regurgitation is moderate to severe.  7. The aortic valve is calcified. Aortic valve regurgitation is mild.  8. Evidence of atrial level shunting detected by color flow Doppler. Agitated saline contrast bubble study was positive with shunting observed within 3-6 cardiac cycles suggestive of interatrial shunt. 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 FINDINGS  Left Ventricle: Left ventricular ejection fraction, by estimation, is 25 to 30%. The left ventricle has severely decreased function. The left ventricle demonstrates  global hypokinesis. The left ventricular internal cavity size was mildly dilated. Right Ventricle: The right ventricular size is normal. No increase in right ventricular wall thickness. Right ventricular systolic function is normal. Left Atrium: Left atrial size was moderately dilated. No left atrial/left atrial appendage thrombus was detected. Right Atrium: Right atrial size  was moderately dilated. Pericardium: There is no evidence of pericardial effusion. Mitral Valve: The mitral valve is abnormal. 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 Tricuspid Valve: The tricuspid valve is normal in structure. Tricuspid valve regurgitation is moderate to severe. Aortic Valve: The aortic valve is calcified. Aortic valve regurgitation is mild. Pulmonic Valve: The pulmonic valve was normal in structure. Pulmonic valve regurgitation is trivial. Aorta: The aortic root and ascending aorta are structurally normal, with no evidence of dilitation. IAS/Shunts: Evidence of atrial level shunting detected by color flow Doppler. Agitated saline contrast was given intravenously to evaluate for intracardiac shunting. Agitated saline contrast bubble study was positive with shunting observed within 3-6 cardiac cycles suggestive of interatrial shunt. A small patent foramen ovale is detected with predominantly right to left shunting across the atrial septum. There is no evidence of an atrial septal defect. Serafina Royals MD Electronically signed by Serafina Royals MD Signature Date/Time: 10/24/2020/1:59:29 PM    Final     Scheduled Meds: . apixaban  5 mg Oral BID  . furosemide  40 mg Oral Daily  . levalbuterol  1.25 mg Nebulization TID  . metoprolol tartrate  25 mg Oral BID  . pantoprazole  40 mg Oral Daily  . sodium chloride flush  10 mL Intravenous Q12H  . vitamin B-12  1,000 mcg Oral Daily   Continuous Infusions:   LOS: 2 days   Time spent: 25  minutes.  Lorella Nimrod, MD [facility]  If 7PM-7AM, please contact  night-coverage [email or web address]  10/24/2020, 3:08 PM   This record has been created using Systems analyst. Errors have been sought and corrected,but may not always be located. Such creation errors do not reflect on the standard of care.

## 2020-10-24 NOTE — Transfer of Care (Signed)
Immediate Anesthesia Transfer of Care Note  Patient: Karl Norman  Procedure(s) Performed: CARDIOVERSION (N/A ) TRANSESOPHAGEAL ECHOCARDIOGRAM (TEE) (N/A )  Patient Locatio  Anesthesia Type:General  Level of Consciousness: awake, alert  and oriented  Airway & Oxygen Therapy: Patient Spontanous Breathing and Patient connected to face mask oxygen  Post-op Assessment: Report given to RN and Post -op Vital signs reviewed and stable  Post vital signs: Reviewed and stable  Last Vitals:  Vitals Value Taken Time  BP 76/58 10/24/20 1209  Temp    Pulse    Resp    SpO2      Last Pain:  Vitals:   10/24/20 1100  TempSrc: Oral  PainSc: 0-No pain         Complications: No complications documented.

## 2020-10-24 NOTE — Anesthesia Preprocedure Evaluation (Signed)
Anesthesia Evaluation  Patient identified by MRN, date of birth, ID band Patient awake    Reviewed: Allergy & Precautions, H&P , NPO status , Patient's Chart, lab work & pertinent test results  Airway Mallampati: I       Dental no notable dental hx. (+) Teeth Intact   Pulmonary shortness of breath, COPD, former smoker,    Pulmonary exam normal breath sounds clear to auscultation       Cardiovascular +CHF   Rhythm:Regular Rate:Normal     Neuro/Psych negative neurological ROS  negative psych ROS   GI/Hepatic Neg liver ROS, GERD  ,  Endo/Other  negative endocrine ROS  Renal/GU negative Renal ROS  negative genitourinary   Musculoskeletal negative musculoskeletal ROS (+)   Abdominal   Peds negative pediatric ROS (+)  Hematology negative hematology ROS (+)   Anesthesia Other Findings   Reproductive/Obstetrics negative OB ROS                             Anesthesia Physical Anesthesia Plan  ASA: III  Anesthesia Plan: General   Post-op Pain Management:    Induction: Intravenous  PONV Risk Score and Plan: 2 and Propofol infusion  Airway Management Planned: Natural Airway and Nasal Cannula  Additional Equipment:   Intra-op Plan:   Post-operative Plan:   Informed Consent: I have reviewed the patients History and Physical, chart, labs and discussed the procedure including the risks, benefits and alternatives for the proposed anesthesia with the patient or authorized representative who has indicated his/her understanding and acceptance.     Dental advisory given  Plan Discussed with: CRNA and Anesthesiologist  Anesthesia Plan Comments:         Anesthesia Quick Evaluation

## 2020-10-24 NOTE — Progress Notes (Signed)
Patient report called to [place], [place], [place].  Accompanied patient back to room and face-to-face report update with Janett Billow, RN in patient room.

## 2020-10-24 NOTE — Progress Notes (Signed)
CSW acknowledges consult for HH/DME needs. CSW will follow up after PT/OT evals if ordered.  Oleh Genin, [place]

## 2020-10-24 NOTE — Progress Notes (Signed)
*  PRELIMINARY RESULTS* Echocardiogram Echocardiogram Transesophageal has been performed.  Sherrie Sport 10/24/2020, 12:09 PM

## 2020-10-24 NOTE — CV Procedure (Signed)
Transesophageal echocardiogram preliminary report  Karl Norman [id number] 04-Jan-1946  Preliminary diagnosis Atrial flutter   Postprocedural diagnosis  Atrial fibrillation without left atrial appendage thrombus  Time out A timeout was performed by the nursing staff and physicians specifically identifying the procedure performed, identification of the patient, the type of sedation, all allergies and medications, all pertinent medical history, and presedation assessment of nasopharynx. The patient and or family understand the risks of the procedure including the rare risks of death, stroke, heart attack, esophogeal perforation, sore throat, and reaction to medications given.  Moderate sedation During this procedure the patient has received  meds per anesthesia to achieve appropriate moderate sedation.  The patient had continued monitoring of heart rate, oxygenation, blood pressure, respiratory rate, and extent of signs of sedation throughout the entire procedure.  The patient received this moderate sedation over a period of 18 minutes.  Both the nursing staff and I were present during the procedure when the patient had moderate sedation for 100% of the time.  Treatment considerations  Electrical cardioversion of atrial fibrillation due to no evidence of atrial appendage thrombus  For further details of transesophageal echocardiogram please refer to final report.  Signed,  Corey Skains M.D. [facility] 10/24/2020 11:55 AM

## 2020-10-24 NOTE — Progress Notes (Signed)
Patient is requesting coffee this morning. Explained to patient at the beginning of the shift that he will be NPO after midnight. Reiterated to patient at 450am that he was NPO for his procedure. Patient stated that he only needs to be NPO for four hours and the physician told him he could have coffee this morning. Staff explained to patient that he does not have orders to have coffee this morning and will discuss with physician about his concerns. Patient has become rude and upset with staff. Will continue to monitor.

## 2020-10-24 NOTE — Anesthesia Postprocedure Evaluation (Signed)
Anesthesia Post Note  Patient: Karl Norman  Procedure(s) Performed: CARDIOVERSION (N/A ) TRANSESOPHAGEAL ECHOCARDIOGRAM (TEE) (N/A )  Patient location during evaluation: Cath Lab Anesthesia Type: General Level of consciousness: awake Pain management: pain level controlled Vital Signs Assessment: post-procedure vital signs reviewed and stable Respiratory status: spontaneous breathing Cardiovascular status: stable Postop Assessment: no headache Anesthetic complications: no   No complications documented.   Last Vitals:  Vitals:   10/24/20 1256 10/24/20 1332  BP: (!) 86/58 96/65  Pulse: 86 80  Resp: 18 18  Temp:  36.5 C  SpO2: 97% 93%    Last Pain:  Vitals:   10/24/20 1332  TempSrc:   PainSc: 0-No pain                 Neva Seat

## 2020-10-24 NOTE — Progress Notes (Signed)
CRITICAL VALUE ALERT  Critical Value:  Potassium level 6.4  Date & Time Notied:  10/25/19 0515  Provider Notified: Dr.Duncan  Orders Received/Actions taken: awaiting orders

## 2020-10-24 NOTE — Progress Notes (Signed)
MD made aware of low BP/ orders to hold A.M. dose of  metoprolol and lasix.

## 2020-10-25 ENCOUNTER — Encounter: Payer: Self-pay | Admitting: Internal Medicine

## 2020-10-25 LAB — BASIC METABOLIC PANEL
Anion gap: 7 (ref 5–15)
BUN: 36 mg/dL — ABNORMAL HIGH (ref 8–23)
CO2: 27 mmol/L (ref 22–32)
Calcium: 8.8 mg/dL — ABNORMAL LOW (ref 8.9–10.3)
Chloride: 97 mmol/L — ABNORMAL LOW (ref 98–111)
Creatinine, Ser: 2 mg/dL — ABNORMAL HIGH (ref 0.61–1.24)
GFR, Estimated: 35 mL/min — ABNORMAL LOW (ref >60)
Glucose, Bld: 113 mg/dL — ABNORMAL HIGH (ref 70–99)
Potassium: 5.2 mmol/L — ABNORMAL HIGH (ref 3.5–5.1)
Sodium: 131 mmol/L — ABNORMAL LOW (ref 135–145)

## 2020-10-25 MED ORDER — APIXABAN 5 MG PO TABS: 5.0000 mg | ORAL_TABLET | Freq: Two times a day (BID) | ORAL | 2 refills | Status: DC

## 2020-10-25 MED ORDER — SODIUM POLYSTYRENE SULFONATE 15 GM/60ML PO SUSP
30.0000 g | Freq: Once | ORAL | Status: AC
  Administered 2020-10-25: 30 g via ORAL
  Filled 2020-10-25: qty 120

## 2020-10-25 MED ORDER — METOPROLOL TARTRATE 25 MG PO TABS: 25.0000 mg | ORAL_TABLET | Freq: Two times a day (BID) | ORAL | 1 refills | Status: DC

## 2020-10-25 MED ORDER — FUROSEMIDE 40 MG PO TABS: 40.0000 mg | ORAL_TABLET | Freq: Every day | ORAL | 1 refills | Status: DC

## 2020-10-25 NOTE — Discharge Summary (Addendum)
Physician Discharge Summary  Karl Norman [id number] DOB: 1945/12/11 DOA: 10/21/2020  PCP: Idelle Crouch, MD  Admit date: 10/21/2020 .   Discharge date: 10/25/2020  Admitted From: Home.  Disposition:  Home.  Recommendations for Outpatient Follow-up:  1. Follow up with PCP in 1-2 weeks. 2. Please obtain BMP/CBC in one week. 3. Advised to follow up Dr. Nehemiah Massed in one week. 4. Advised to take metoprolol 25 mg  PO Bid 5. Advised to take Eliquis 5 mg BID for anticoagulation.  Home Health: None. Equipment/Devices: None.  Discharge Condition: Stable CODE STATUS:Full code Diet recommendation: Heart Healthy   Brief Summary/ Hospital Course: Karl Zazueta Foxis a 75 y.o.malewith PMH significant forhypertension,GERD presents to the emergency department for acute shortness of breath. He endorses worseningshortnessof breathx 14 days associated with lower extremity swelling. He was hypoxic requiring BiPAP initially in the ED.  Elevated BNP at 661, EKG with atrial flutter and RVR, He was started on IV diuresis along with metoprolol. His Oxygen saturation improved. Heart rate improved. He continued to have a flutter with 2:1 conduction, Cardiology was consulted and patient was scheduled for cardioversion. Repeat echocardiogram showed reduced EF and biatrial and biventricular dilatation. Patient underwent transesophageal echocardiogram on 2/3  which showed low EF at 25 to 30% and no obvious thrombus.  He also underwent successful cardioversion for atrial flutter. Patient continued to remain in sinus rhythm afterwards . He seems improved,  feels better.  Patient has ambulated in the hallway without any chest pain or palpitations. Patient was seen and cleared by cardiology to be discharged. Patient is being discharged home.  He was managed for below problems.   Discharge Diagnoses:  Active Problems:   Heart failure (HCC)   Acute exacerbation of CHF (congestive heart failure) (HCC)  Acute  systolic heart failure: Echocardiogram with new diagnosis of reduced EF of 30 to 35%.  Dilated all chambers and global hypokinesis.   Breathing status improved with diuresis. Cardiology is on board-appreciate their help. Patient might require ischemic work-up for new diagnosis of reduced EF, he had normal EF on prior echo which was done in 2019 He was switched to p.o. Lasix, dose was increased to 40 mg daily instead of his home dose of 20. -Daily BMP and weight -Strict intake and output -Continue Xopenex breathing treatment as there was some wheeze noted.  Atrial flutter with RVR.  Heart rate improved. Patient underwent successful cardioversion on 10/24/20. Continue with Lovenox. Continue with metoprolol Continue to monitor Patient remains in sinus rhythm and cleared from cardiology to be discharged. Patient is started on Eliquis 5 mg twice daily for anticoagulation to prevent stroke.   Hyperkalemia.   Early morning labs again show elevated potassium at 6.4 and it was 5 on repeat check. Kayexalate given. -Continue to monitor  GERD. Continue with PPI  B12 deficiency. Continue home supplement of B12  Hypertension.  Blood pressure within goal. He was on low-dose Lasix only at home. -Continue to monitor.   Discharge Instructions  Discharge Instructions    Call MD for:  difficulty breathing, headache or visual disturbances   Complete by: As directed    Call MD for:  persistant dizziness or light-headedness   Complete by: As directed    Call MD for:  persistant nausea and vomiting   Complete by: As directed    Call MD for:  temperature >100.4   Complete by: As directed    Diet - low sodium heart healthy   Complete by: As directed  Diet Carb Modified   Complete by: As directed    Discharge instructions   Complete by: As directed    Advised to follow up PCP in one week. Advised to follow up Dr. Nehemiah Massed in one week. Advised to take metoprolol 25 mg Bid Advised to  take Eliquis 5 mg BID for anticoagulation.   Increase activity slowly   Complete by: As directed      Allergies as of 10/25/2020      Reactions   Penicillins    Has patient had a PCN reaction causing immediate rash, facial/tongue/throat swelling, SOB or lightheadedness with hypotension: NO Has patient had a PCN reaction causing severe rash involving mucus membranes or skin necrosis: YES Has patient had a PCN reaction that required hospitalization: YES Has patient had a PCN reaction occurring within the last 10 years: NO If all of the above answers are "NO", then may proceed with Cephalosporin use.      Medication List    TAKE these medications   apixaban 5 MG Tabs tablet Commonly known as: ELIQUIS Take 1 tablet (5 mg total) by mouth 2 (two) times daily.   b complex vitamins capsule Take 1 capsule by mouth daily.   furosemide 40 MG tablet Commonly known as: LASIX Take 1 tablet (40 mg total) by mouth daily. Start taking on: October 26, 2020 What changed:   medication strength  how much to take   ibuprofen 200 MG tablet Commonly known as: ADVIL Take 400 mg by mouth every 6 (six) hours as needed for mild pain.   metoprolol tartrate 25 MG tablet Commonly known as: LOPRESSOR Take 1 tablet (25 mg total) by mouth 2 (two) times daily.   omeprazole 20 MG capsule Commonly known as: PRILOSEC Take 20 mg by mouth daily.   simethicone 80 MG chewable tablet Commonly known as: MYLICON Chew 80 mg by mouth every 6 (six) hours as needed for flatulence.   vitamin B-12 1000 MCG tablet Commonly known as: CYANOCOBALAMIN Take 1,000 mcg by mouth daily.       Follow-up Information    [facility] Follow up on 11/04/2020.   Specialty: Cardiology Why: at 12:00pm. Enter through the [facility] entrance Contact information: [place] [phone]       Corey Skains, MD Follow up in 1  week(s).   Specialty: Cardiology Contact information: [facility] [place] [address] [phone]        Idelle Crouch, MD Follow up in 1 week(s).   Specialty: Internal Medicine Contact information: [place] [address] [phone]              Allergies  Allergen Reactions  . Penicillins     Has patient had a PCN reaction causing immediate rash, facial/tongue/throat swelling, SOB or lightheadedness with hypotension: NO Has patient had a PCN reaction causing severe rash involving mucus membranes or skin necrosis: YES Has patient had a PCN reaction that required hospitalization: YES Has patient had a PCN reaction occurring within the last 10 years: NO If all of the above answers are "NO", then may proceed with Cephalosporin use.     Consultations:  Cardiology   Procedures/Studies: DG Chest 2 View  Result Date: 10/21/2020 CLINICAL DATA:  Shortness of breath with bilateral lower leg swelling. EXAM: CHEST - 2 VIEW COMPARISON:  June 21, 2018. FINDINGS: Small bilateral pleural effusions  with mild overlying bibasilar opacities. No visible pneumothorax. Enlarged cardiac silhouette with central pulmonary vascular congestion. Aortic atherosclerosis. No acute osseous abnormality. Degenerative changes of the spine. IMPRESSION: Findings suggestive of congestive heart failure with cardiomegaly, central pulmonary vascular congestion, and small bilateral pleural effusions. Overlying mild bibasilar opacities are favored to represent atelectasis. Electronically Signed   By: Margaretha Sheffield MD   On: 10/21/2020 14:00   ECHOCARDIOGRAM COMPLETE  Result Date: 10/22/2020    ECHOCARDIOGRAM REPORT   Patient Name:   KEYSHON FONGER Date of Exam: 10/21/2020 Medical Rec #:  [id number]    Height:       69.0 in Accession #:    [id number]   Weight:       212.0 lb Date of Birth:  1946/03/04   BSA:          2.118  m Patient Age:    75 years     BP:           101/75 mmHg Patient Gender: M            HR:           128 bpm. Exam Location:  [facility] Procedure: 2D Echo, Cardiac Doppler and Color Doppler Indications:     Dyspnea R06.00  History:         Patient has prior history of Echocardiogram examinations. CHF;                  Signs/Symptoms:Dyspnea.  Sonographer:     Alyse Low Roar Referring Phys:  [id number] AMY N COX Diagnosing Phys: Serafina Royals MD IMPRESSIONS  1. Left ventricular ejection fraction, by estimation, is 30 to 35%. The left ventricle has moderately decreased function. The left ventricle demonstrates global hypokinesis. The left ventricular internal cavity size was moderately dilated. Left ventricular diastolic parameters are consistent with Grade III diastolic dysfunction (restrictive).  2. Right ventricular systolic function is moderately reduced. The right ventricular size is moderately enlarged.  3. Left atrial size was moderately dilated.  4. Right atrial size was moderately dilated.  5. 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  6. Tricuspid valve regurgitation is moderate to severe.  7. The aortic valve is normal in structure. Aortic valve regurgitation is mild. FINDINGS  Left Ventricle: Left ventricular ejection fraction, by estimation, is 30 to 35%. The left ventricle has moderately decreased function. The left ventricle demonstrates global hypokinesis. The left ventricular internal cavity size was moderately dilated. There is no left ventricular hypertrophy. Left ventricular diastolic parameters are consistent with Grade III diastolic dysfunction (restrictive). Right Ventricle: The right ventricular size is moderately enlarged. No increase in right ventricular wall thickness. Right ventricular systolic function is moderately reduced. Left Atrium: Left atrial size was moderately dilated. Right Atrium: Right atrial size was moderately dilated. Pericardium: There is no evidence of  pericardial effusion. 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 Tricuspid Valve: The tricuspid valve is normal in structure. Tricuspid valve regurgitation is moderate to severe. Aortic Valve: The aortic valve is normal in structure. Aortic valve regurgitation is mild. Aortic valve peak gradient measures 5.6 mmHg. Pulmonic Valve: The pulmonic valve was normal in structure. Pulmonic valve regurgitation is mild. Aorta: The aortic root and ascending aorta are structurally normal, with no evidence of dilitation. IAS/Shunts: No atrial level shunt detected by color flow Doppler.  LEFT VENTRICLE PLAX 2D LVIDd:         5.56 cm  Diastology LVIDs:  4.75 cm  LV e' medial:    9.03 cm/s LV PW:         1.08 cm  LV E/e' medial:  15.2 LV IVS:        1.20 cm  LV e' lateral:   15.40 cm/s LVOT diam:     2.00 cm  LV E/e' lateral: 8.9 LVOT Area:     3.14 cm  RIGHT VENTRICLE RV Mid diam:    4.98 cm RV S prime:     8.81 cm/s TAPSE (M-mode): 1.4 cm LEFT ATRIUM              Index       RIGHT ATRIUM           Index LA diam:        4.60 cm  2.17 cm/m  RA Area:     29.60 cm LA Vol (A2C):   134.0 ml 63.27 ml/m RA Volume:   101.00 ml 47.69 ml/m LA Vol (A4C):   93.3 ml  44.05 ml/m LA Biplane Vol: 113.0 ml 53.36 ml/m  AORTIC VALVE                PULMONIC VALVE AV Area (Vmax): 1.89 cm    PV Vmax:        0.65 m/s AV Vmax:        118.00 cm/s PV Peak grad:   1.7 mmHg AV Peak Grad:   5.6 mmHg    RVOT Peak grad: 0 mmHg LVOT Vmax:      71.10 cm/s  AORTA Ao Root diam: 3.20 cm MITRAL VALVE                TRICUSPID VALVE MV Area (PHT): 7.37 cm     TR Peak grad:   21.5 mmHg MV Decel Time: 103 msec     TR Vmax:        232.00 cm/s MV E velocity: 137.00 cm/s                             SHUNTS                             Systemic Diam: 2.00 cm Serafina Royals MD Electronically signed by Serafina Royals MD Signature Date/Time: 10/22/2020/8:22:48 AM    Final    ECHO TEE  Result Date: 10/24/2020    TRANSESOPHOGEAL  ECHO REPORT   Patient Name:   Karl Norman Date of Exam: 10/24/2020 Medical Rec #:  [id number]    Height:       69.0 in Accession #:    [id number]   Weight:       212.0 lb Date of Birth:  Aug 19, 1946   BSA:          2.118 m Patient Age:    51 years     BP:           107/80 mmHg Patient Gender: M            HR:           129 bpm. Exam Location:  [facility] Procedure: Transesophageal Echo, Cardiac Doppler and Color Doppler Indications:     Not listed on TEE check-in sheet  History:         Patient has prior history of Echocardiogram examinations, most  recent 10/21/2020. CHF, COPD; Signs/Symptoms:Dyspnea.  Sonographer:     Sherrie Sport RDCS (AE) Referring Phys:  [place] Diagnosing Phys: Serafina Royals MD PROCEDURE: The transesophogeal probe was passed without difficulty through the esophogus of the patient. Sedation performed by performing physician. The patient developed no complications during the procedure. A successful direct current cardioversion was performed at 120 joules with 1 attempt. IMPRESSIONS  1. Left ventricular ejection fraction, by estimation, is 25 to 30%. The left ventricle has severely decreased function. The left ventricle demonstrates global hypokinesis. The left ventricular internal cavity size was mildly dilated.  2. Right ventricular systolic function is normal. The right ventricular size is normal.  3. Left atrial size was moderately dilated. No left atrial/left atrial appendage thrombus was detected.  4. Right atrial size was moderately dilated.  5. The mitral valve is abnormal. 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  6. Tricuspid valve regurgitation is moderate to severe.  7. The aortic valve is calcified. Aortic valve regurgitation is mild.  8. Evidence of atrial level shunting detected by color flow Doppler. Agitated saline contrast bubble study was positive with shunting observed within 3-6 cardiac cycles suggestive of interatrial shunt. There is a small  patent foramen ovale with predominantly right to left shunting across the atrial septum. FINDINGS  Left Ventricle: Left ventricular ejection fraction, by estimation, is 25 to 30%. The left ventricle has severely decreased function. The left ventricle demonstrates global hypokinesis. The left ventricular internal cavity size was mildly dilated. Right Ventricle: The right ventricular size is normal. No increase in right ventricular wall thickness. Right ventricular systolic function is normal. Left Atrium: Left atrial size was moderately dilated. No left atrial/left atrial appendage thrombus was detected. Right Atrium: Right atrial size was moderately dilated. Pericardium: There is no evidence of pericardial effusion. Mitral Valve: The mitral valve is abnormal. 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 Tricuspid Valve: The tricuspid valve is normal in structure. Tricuspid valve regurgitation is moderate to severe. Aortic Valve: The aortic valve is calcified. Aortic valve regurgitation is mild. Pulmonic Valve: The pulmonic valve was normal in structure. Pulmonic valve regurgitation is trivial. Aorta: The aortic root and ascending aorta are structurally normal, with no evidence of dilitation. IAS/Shunts: Evidence of atrial level shunting detected by color flow Doppler. Agitated saline contrast was given intravenously to evaluate for intracardiac shunting. Agitated saline contrast bubble study was positive with shunting observed within 3-6 cardiac cycles suggestive of interatrial shunt. A small patent foramen ovale is detected with predominantly right to left shunting across the atrial septum. There is no evidence of an atrial septal defect. Serafina Royals MD Electronically signed by Serafina Royals MD Signature Date/Time: 10/24/2020/1:59:29 PM    Final     TTE, TEE. Cardioversion.   Subjective: Patient was seen and examined at bedside. Overnight events noted. Patient underwent successful cardioversion of atrial  flutter.  Patient remains in sinus rhythm, Heart rate remains controlled.  Patient denies any chest pain,  shortness of breath,  Dizziness,  palpitationS. Patient is ambulated in the hallway without any symptoms.  Patient is cleared from cardiology and patient wants to be discharged home.  Discharge Exam: Vitals:   10/25/20 1140 10/25/20 1142  BP: (!) 159/143 105/76  Pulse: 81 83  Resp: 20 20  Temp: 98.5 F (36.9 C) 98.5 F (36.9 C)  SpO2: 92% 90%   Vitals:   10/25/20 0728 10/25/20 0836 10/25/20 1140 10/25/20 1142  BP:  100/72 (!) 159/143 105/76  Pulse:  84 81  83  Resp:  18 20 20   Temp:  (!) 97.5 F (36.4 C) 98.5 F (36.9 C) 98.5 F (36.9 C)  TempSrc:  Oral Oral Oral  SpO2: 92% 95% 92% 90%  Weight:      Height:        General: Pt is alert, awake, not in acute distress Cardiovascular: RRR, S1/S2 +, no rubs, no gallops Respiratory: CTA bilaterally, no wheezing, no rhonchi Abdominal: Soft, NT, ND, bowel sounds + Extremities: no edema, no cyanosis    The results of significant diagnostics from this hospitalization (including imaging, microbiology, ancillary and laboratory) are listed below for reference.     Microbiology: Recent Results (from the past 240 hour(s))  SARS CORONAVIRUS 2 (TAT 6-24 HRS) Nasopharyngeal Nasopharyngeal Swab     Status: None   Collection Time: 10/21/20  6:09 PM   Specimen: Nasopharyngeal Swab  Result Value Ref Range Status   SARS Coronavirus 2 NEGATIVE NEGATIVE Final    Comment: (NOTE) SARS-CoV-2 target nucleic acids are NOT DETECTED.  The SARS-CoV-2 RNA is generally detectable in upper and lower respiratory specimens during the acute phase of infection. Negative results do not preclude SARS-CoV-2 infection, do not rule out co-infections with other pathogens, and should not be used as the sole basis for treatment or other patient management decisions. Negative results must be combined with clinical observations, patient history, and  epidemiological information. The expected result is Negative.  Fact Sheet for Patients: SugarRoll.be  Fact Sheet for Healthcare Providers: https://www.woods-mathews.com/  This test is not yet approved or cleared by the [place] FDA and  has been authorized for detection and/or diagnosis of SARS-CoV-2 by FDA under an Emergency Use Authorization (EUA). This EUA will remain  in effect (meaning this test can be used) for the duration of the COVID-19 declaration under Se ction 564(b)(1) of the Act, 21 U.S.C. section 360bbb-3(b)(1), unless the authorization is terminated or revoked sooner.  Performed at [facility], [place] [address], [place], [place] [address]      Labs: BNP (last 3 results) Recent Labs    10/21/20 1332  BNP [id number]   Basic Metabolic Panel: Recent Labs  Lab 10/23/20 0455 10/23/20 0841 10/24/20 0359 10/24/20 0555 10/25/20 0414  NA 138 134* 138 135 131*  K 5.7* 4.5 6.4* 5.0 5.2*  CL 99 97* 101 98 97*  CO2 29 27 28 28 27   GLUCOSE 99 112* 110* 108* 113*  BUN 21 21 29* 27* 36*  CREATININE 1.51* 1.38* 1.61* 1.44* 2.00*  CALCIUM 9.0 8.7* 9.0 8.8* 8.8*   Liver Function Tests: No results for input(s): AST, ALT, ALKPHOS, BILITOT, PROT, ALBUMIN in the last 168 hours. No results for input(s): LIPASE, AMYLASE in the last 168 hours. No results for input(s): AMMONIA in the last 168 hours. CBC: Recent Labs  Lab 10/21/20 1331  WBC 5.7  HGB 11.1*  HCT 36.8*  MCV 82.0  PLT 186   Cardiac Enzymes: No results for input(s): CKTOTAL, CKMB, CKMBINDEX, TROPONINI in the last 168 hours. BNP: Invalid input(s): POCBNP CBG: No results for input(s): GLUCAP in the last 168 hours. D-Dimer No results for input(s): DDIMER in the last 72 hours. Hgb A1c No results for input(s): HGBA1C in the last 72 hours. Lipid Profile No results for input(s): CHOL, HDL, LDLCALC, TRIG, CHOLHDL, LDLDIRECT in the last 72 hours. Thyroid  function studies No results for input(s): TSH, T4TOTAL, T3FREE, THYROIDAB in the last 72 hours.  Invalid input(s): FREET3 Anemia work up No results for  input(s): VITAMINB12, FOLATE, FERRITIN, TIBC, IRON, RETICCTPCT in the last 72 hours. Urinalysis    Component Value Date/Time   COLORURINE AMBER (A) 06/21/2018 2222   APPEARANCEUR CLEAR (A) 06/21/2018 2222   LABSPEC 1.013 06/21/2018 2222   PHURINE 6.0 06/21/2018 2222   GLUCOSEU NEGATIVE 06/21/2018 2222   HGBUR NEGATIVE 06/21/2018 2222   BILIRUBINUR MODERATE (A) 06/21/2018 2222   KETONESUR NEGATIVE 06/21/2018 2222   PROTEINUR 30 (A) 06/21/2018 2222   NITRITE NEGATIVE 06/21/2018 2222   LEUKOCYTESUR NEGATIVE 06/21/2018 2222   Sepsis Labs Invalid input(s): PROCALCITONIN,  WBC,  LACTICIDVEN Microbiology Recent Results (from the past 240 hour(s))  SARS CORONAVIRUS 2 (TAT 6-24 HRS) Nasopharyngeal Nasopharyngeal Swab     Status: None   Collection Time: 10/21/20  6:09 PM   Specimen: Nasopharyngeal Swab  Result Value Ref Range Status   SARS Coronavirus 2 NEGATIVE NEGATIVE Final    Comment: (NOTE) SARS-CoV-2 target nucleic acids are NOT DETECTED.  The SARS-CoV-2 RNA is generally detectable in upper and lower respiratory specimens during the acute phase of infection. Negative results do not preclude SARS-CoV-2 infection, do not rule out co-infections with other pathogens, and should not be used as the sole basis for treatment or other patient management decisions. Negative results must be combined with clinical observations, patient history, and epidemiological information. The expected result is Negative.  Fact Sheet for Patients: SugarRoll.be  Fact Sheet for Healthcare Providers: https://www.woods-mathews.com/  This test is not yet approved or cleared by the [place] FDA and  has been authorized for detection and/or diagnosis of SARS-CoV-2 by FDA under an Emergency Use Authorization  (EUA). This EUA will remain  in effect (meaning this test can be used) for the duration of the COVID-19 declaration under Se ction 564(b)(1) of the Act, 21 U.S.C. section 360bbb-3(b)(1), unless the authorization is terminated or revoked sooner.  Performed at [facility], [place] [address], [place], [place] [address]      Time coordinating discharge: Over 30 minutes  SIGNED:   Shawna Clamp, MD  [facility] 10/25/2020, 12:10 PM Pager   If 7PM-7AM, please contact night-coverage [email or web address]

## 2020-10-25 NOTE — Evaluation (Signed)
Occupational Therapy Evaluation Patient Details Name: Karl Norman MRN: [id number] DOB: June 18, 1946 Today's Date: 10/25/2020    History of Present Illness Karl Norman is a 75 y.o. male with medical history significant for hypertension, GERD presents to the emergency department for shortness of breath. Cardioversion on 10/24/20.   Clinical Impression   Upon entering the room, pt supine in bed with no c/o pain and agreeable to OT evaluation. Family member present during session. Pt reports living with spouse in 1 level home. Pt reports occasional use of lofstrand crutch with ambulation when he has hip or back pain at baseline. Pt with clothing and shoes donned reporting increased time secondary to "extra fluid on me". Pt demonstrated functional ambulation with R lofstrand crutch, self care tasks, and functional transfers at mod I level. Pt does not need physical assist or cuing for safety awareness. Pt does take standing rest break after 100' and report he does this as home as well to conserve energy. Pt's HR 80-100 bpm with functional tasks. Pt reports at his baseline with no further questions or concerns at this time. OT to SIGN OFF. Thank you for this referral.     Follow Up Recommendations  No OT follow up    Equipment Recommendations  None recommended by OT       Precautions / Restrictions Precautions Precautions: None      Mobility Bed Mobility Overal bed mobility: Modified Independent;Independent                  Transfers Overall transfer level: Modified independent Equipment used: Lofstrands             General transfer comment: no phsyical assistance needed    Balance Overall balance assessment: Modified Independent                                         ADL either performed or assessed with clinical judgement   ADL Overall ADL's : Modified independent                                       General ADL Comments: increased  time to complete tasks. Pt reports, " It takes me longer with the extra fluid on me but I do it myself".     Vision Baseline Vision/History: No visual deficits Patient Visual Report: No change from baseline              Pertinent Vitals/Pain Pain Assessment: No/denies pain     Hand Dominance Right   Extremity/Trunk Assessment Upper Extremity Assessment Upper Extremity Assessment: Overall WFL for tasks assessed           Communication Communication Communication: No difficulties   Cognition Arousal/Alertness: Awake/alert Behavior During Therapy: WFL for tasks assessed/performed Overall Cognitive Status: Within Functional Limits for tasks assessed                                                Home Living Family/patient expects to be discharged to:: Private residence Living Arrangements: Spouse/significant other Available Help at Discharge: Available 24 hours/day;Family Type of Home: House Home Access: Stairs to enter [employer] of Steps: 4 STE Entrance Stairs-Rails:  Right;Left;Can reach both Home Layout: One level     Bathroom Shower/Tub: Occupational psychologist: Handicapped height Bathroom Accessibility: Yes   Home Equipment: Crutches   Additional Comments: uses R loft strand crutch when needed (pain related in hip per pt report)      Prior Functioning/Environment Level of Independence: Independent with assistive device(s)                          OT Goals(Current goals can be found in the care plan section) Acute Rehab OT Goals Patient Stated Goal: to go home  OT Frequency:      AM-PAC OT "6 Clicks" Daily Activity     Outcome Measure Help from another person eating meals?: None Help from another person taking care of personal grooming?: None Help from another person toileting, which includes using toliet, bedpan, or urinal?: None Help from another person bathing (including washing, rinsing, drying)?:  None Help from another person to put on and taking off regular upper body clothing?: None Help from another person to put on and taking off regular lower body clothing?: None 6 Click Score: 24   End of Session Equipment Utilized During Treatment: Other (comment) (crutch) Nurse Communication: Mobility status  Activity Tolerance: Patient tolerated treatment well Patient left: in bed;with call bell/phone within reach;with family/visitor present                   Time: 1030-1040 OT Time Calculation (min): 10 min Charges:  OT General Charges $OT Visit: 1 Visit OT Evaluation $OT Eval Low Complexity: 1 Low  Darleen Crocker, MS, OTR/L , CBIS ascom [phone]  10/25/20, 11:32 AM

## 2020-10-25 NOTE — Progress Notes (Signed)
[facility] Cardiology progress note  Patient ID: KEMARI BALBIN, MRN: [id number], DOB/AGE: [birth year] 75 y.o. Admit date: 10/21/2020   Date of Consult: 10/25/2020    2/1.  Patient overall doing much better today.  He feels well with no evidence of chest pain or significant shortness of breath.  The patient has had significant urine output and lower extremities and pulmonary edema slightly improved.  The patient remains in atrial flutter with 2-1 block at 139 bpm.  We have discussed at length that the patient may benefit from electrical cardioversion due to medication management not typically helping with atrial flutter very well.  He is the understanding of this may work  2/2.  Patient is ambulating in the hall without evidence of significant congestive heart failure type symptoms angina or other concerns.  Patient does have continued atrial flutter at 128bpm and unchanged by medication management increases including metoprolol.  We have discussed that atrial flutter is not typically sensitive to these medication management or antiarrhythmics and electrical cardioversion may be more appropriate.  He has agreed to this although we have discussed the possibility of anticoagulation for risk reduction in stroke with atrial fibrillation as well as transesophageal echocardiogram to assess for left atrial appendage thrombus prior to electrical cardioversion.  He understands all the risks and benefits of these treatments and agrees at this time.  He is at low risk for general anesthesia  2/3.  Patient tolerated transesophageal echocardiogram showing moderate to severe systolic dysfunction congestive heart failure with ejection fraction of 25 to 30%.  He had moderate to severe biatrial enlargement with moderate to severe mitral and tricuspid regurgitation.  There was no evidence of spontaneous contrast and no evidence of atrial thrombus and/or atrial appendage thrombus.  The patient also had moderate aortic  atherosclerosis and a patent foramen ovale.  He therefore underwent a electrical cardioversion of atrial flutter with rapid ventricular rate to normal sinus rhythm and has tolerated it well.  No further complication.  2/4.  Patient tolerated medication management last night including metoprolol and maintaining normal sinus rhythm at this time.  The patient still does have some bibasilar Rales to a small extent but concerns for worsening chronic kidney disease if intravenous Lasix pushed harder.  Therefore patient is well at appropriate medication management at this time  Echocardiogram showing no severe LV systolic dysfunction with ejection fraction of 25% with moderate to severe mitral and tricuspid regurgitation with biatrial enlargement and biventricular enlargement.   Past Medical History:  Diagnosis Date  . CHF (congestive heart failure) ([place])    had three tubes to drain heart 2011  . COPD (chronic obstructive pulmonary disease) ([place])   . Dyspnea    with exertion  . GERD (gastroesophageal reflux disease)   . 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 Pancreatitis       Surgical History:  Past Surgical History:  Procedure Laterality Date  . COLONOSCOPY WITH PROPOFOL N/A 08/15/2018   Procedure: COLONOSCOPY WITH PROPOFOL;  Surgeon: Manya Silvas, MD;  Location: [facility] ENDOSCOPY;  Service: Endoscopy;  Laterality: N/A;  . ERCP with stent  07/22/2018  . REPLACEMENT TOTAL KNEE    . TEE WITHOUT CARDIOVERSION N/A 11/11/2015   Procedure: TRANSESOPHAGEAL ECHOCARDIOGRAM (TEE);  Surgeon: Corey Skains, MD;  Location: [facility] ORS;  Service: Cardiovascular;  Laterality: N/A;     Home Meds: Prior to Admission medications   Medication Sig Start Date End Date Taking? Authorizing Provider  b complex vitamins capsule Take 1 capsule by mouth daily.   Yes [provider]  furosemide (LASIX) 20 MG tablet Take 20 mg by mouth daily. 10/08/20  Yes  [provider]  ibuprofen (ADVIL) 200 MG tablet Take 400 mg by mouth every 6 (six) hours as needed for mild pain.   Yes [provider]  omeprazole (PRILOSEC) 20 MG capsule Take 20 mg by mouth daily.   Yes [provider]  simethicone (MYLICON) 80 MG chewable tablet Chew 80 mg by mouth every 6 (six) hours as needed for flatulence.   Yes [provider]  vitamin B-12 (CYANOCOBALAMIN) 1000 MCG tablet Take 1,000 mcg by mouth daily.   Yes [provider]    Inpatient Medications:  . apixaban  5 mg Oral BID  . furosemide  40 mg Oral Daily  . levalbuterol  1.25 mg Nebulization TID  . metoprolol tartrate  25 mg Oral BID  . pantoprazole  40 mg Oral Daily  . sodium chloride flush  10 mL Intravenous Q12H  . vitamin B-12  1,000 mcg Oral Daily     Allergies:  Allergies  Allergen Reactions  . Penicillins     Has patient had a PCN reaction causing immediate rash, facial/tongue/throat swelling, SOB or lightheadedness with hypotension: NO Has patient had a PCN reaction causing severe rash involving mucus membranes or skin necrosis: YES Has patient had a PCN reaction that required hospitalization: YES Has patient had a PCN reaction occurring within the last 10 years: NO If all of the above answers are "NO", then may proceed with Cephalosporin use.     Social History   Socioeconomic History  . Marital status: Married    Spouse name: Not on file  . Number of children: Not on file  . Years of education: Not on file  . Highest education level: Not on file  Occupational History  . Not on file  Tobacco Use  . Smoking status: Former Research scientist (life sciences)  . Smokeless tobacco: Never Used  Vaping Use  . Vaping Use: Never used  Substance and Sexual Activity  . Alcohol use: No  . Drug use: Not Currently  . Sexual activity: Not on file  Other Topics Concern  . Not on file  Social History Narrative  . Not on file   Social Determinants of Health   Financial  Resource Strain: Not on file  Food Insecurity: Not on file  Transportation Needs: Not on file  Physical Activity: Not on file  Stress: Not on file  Social Connections: Not on file  Intimate Partner Violence: Not on file     History reviewed. No pertinent family history.   Review of Systems Positive for shortness of breath Negative for: General:  chills, fever, night sweats or weight changes.  Cardiovascular: PND orthopnea syncope dizziness  Dermatological skin lesions rashes Respiratory: Cough congestion Urologic: Frequent urination urination at night and hematuria Abdominal: negative for nausea, vomiting, diarrhea, bright red blood per rectum, melena, or hematemesis Neurologic: negative for visual changes, and/or hearing changes  All other systems reviewed and are otherwise negative except as noted above.  Labs: No results for input(s): CKTOTAL, CKMB, TROPONINI in the last 72 hours. Lab Results  Component Value Date   WBC 5.7 10/21/2020   HGB 11.1 (L) 10/21/2020   HCT 36.8 (L) 10/21/2020   MCV 82.0 10/21/2020   PLT 186 10/21/2020    Recent Labs  Lab 10/25/20 0414  NA 131*  K 5.2*  CL 97*  CO2 27  BUN 36*  CREATININE 2.00*  CALCIUM 8.8*  GLUCOSE 113*   No results found for: CHOL, HDL, LDLCALC, TRIG No results found for: DDIMER  Radiology/Studies:  DG Chest 2 View  Result Date: 10/21/2020 CLINICAL DATA:  Shortness of breath with bilateral lower leg swelling. EXAM: CHEST - 2 VIEW COMPARISON:  June 21, 2018. FINDINGS: Small bilateral pleural effusions with mild overlying bibasilar opacities. No visible pneumothorax. Enlarged cardiac silhouette with central pulmonary vascular congestion. Aortic atherosclerosis. No acute osseous abnormality. Degenerative changes of the spine. IMPRESSION: Findings suggestive of congestive heart failure with cardiomegaly, central pulmonary vascular congestion, and small bilateral pleural effusions. Overlying mild bibasilar opacities are  favored to represent atelectasis. Electronically Signed   By: Margaretha Sheffield MD   On: 10/21/2020 14:00   ECHOCARDIOGRAM COMPLETE  Result Date: 10/22/2020    ECHOCARDIOGRAM REPORT   Patient Name:   KARRIEM PILLAR Date of Exam: 10/21/2020 Medical Rec #:  [id number]    Height:       69.0 in Accession #:    [id number]   Weight:       212.0 lb Date of Birth:  1945-11-17   BSA:          2.118 m Patient Age:    4 years     BP:           101/75 mmHg Patient Gender: M            HR:           128 bpm. Exam Location:  [facility] Procedure: 2D Echo, Cardiac Doppler and Color Doppler Indications:     Dyspnea R06.00  History:         Patient has prior history of Echocardiogram examinations. CHF;                  Signs/Symptoms:Dyspnea.  Sonographer:     Alyse Low Roar Referring Phys:  [id number] AMY N COX Diagnosing Phys: Serafina Royals MD IMPRESSIONS  1. Left ventricular ejection fraction, by estimation, is 30 to 35%. The left ventricle has moderately decreased function. The left ventricle demonstrates global hypokinesis. The left ventricular internal cavity size was moderately dilated. Left ventricular diastolic parameters are consistent with Grade III diastolic dysfunction (restrictive).  2. Right ventricular systolic function is moderately reduced. The right ventricular size is moderately enlarged.  3. Left atrial size was moderately dilated.  4. Right atrial size was moderately dilated.  5. 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  6. Tricuspid valve regurgitation is moderate to severe.  7. The aortic valve is normal in structure. Aortic valve regurgitation is mild. FINDINGS  Left Ventricle: Left ventricular ejection fraction, by estimation, is 30 to 35%. The left ventricle has moderately decreased function. The left ventricle demonstrates global hypokinesis. The left ventricular internal cavity size was moderately dilated. There is no left ventricular hypertrophy. Left ventricular diastolic parameters are  consistent with Grade III diastolic dysfunction (restrictive). Right Ventricle: The right ventricular size is moderately enlarged. No increase in right ventricular wall thickness. Right ventricular systolic function is moderately reduced. Left Atrium: Left atrial size was moderately dilated. Right Atrium: Right atrial size was moderately dilated. Pericardium: There is no evidence of pericardial effusion. 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 Tricuspid Valve: The tricuspid valve is normal in structure. Tricuspid valve regurgitation is moderate to severe. Aortic Valve: The aortic valve is normal in structure. Aortic valve regurgitation is mild. Aortic valve peak gradient  measures 5.6 mmHg. Pulmonic Valve: The pulmonic valve was normal in structure. Pulmonic valve regurgitation is mild. Aorta: The aortic root and ascending aorta are structurally normal, with no evidence of dilitation. IAS/Shunts: No atrial level shunt detected by color flow Doppler.  LEFT VENTRICLE PLAX 2D LVIDd:         5.56 cm  Diastology LVIDs:         4.75 cm  LV e' medial:    9.03 cm/s LV PW:         1.08 cm  LV E/e' medial:  15.2 LV IVS:        1.20 cm  LV e' lateral:   15.40 cm/s LVOT diam:     2.00 cm  LV E/e' lateral: 8.9 LVOT Area:     3.14 cm  RIGHT VENTRICLE RV Mid diam:    4.98 cm RV S prime:     8.81 cm/s TAPSE (M-mode): 1.4 cm LEFT ATRIUM              Index       RIGHT ATRIUM           Index LA diam:        4.60 cm  2.17 cm/m  RA Area:     29.60 cm LA Vol (A2C):   134.0 ml 63.27 ml/m RA Volume:   101.00 ml 47.69 ml/m LA Vol (A4C):   93.3 ml  44.05 ml/m LA Biplane Vol: 113.0 ml 53.36 ml/m  AORTIC VALVE                PULMONIC VALVE AV Area (Vmax): 1.89 cm    PV Vmax:        0.65 m/s AV Vmax:        118.00 cm/s PV Peak grad:   1.7 mmHg AV Peak Grad:   5.6 mmHg    RVOT Peak grad: 0 mmHg LVOT Vmax:      71.10 cm/s  AORTA Ao Root diam: 3.20 cm MITRAL VALVE                TRICUSPID VALVE MV  Area (PHT): 7.37 cm     TR Peak grad:   21.5 mmHg MV Decel Time: 103 msec     TR Vmax:        232.00 cm/s MV E velocity: 137.00 cm/s                             SHUNTS                             Systemic Diam: 2.00 cm Serafina Royals MD Electronically signed by Serafina Royals MD Signature Date/Time: 10/22/2020/8:22:48 AM    Final    ECHO TEE  Result Date: 10/24/2020    TRANSESOPHOGEAL ECHO REPORT   Patient Name:   Jim Like Date of Exam: 10/24/2020 Medical Rec #:  [id number]    Height:       69.0 in Accession #:    [id number]   Weight:       212.0 lb Date of Birth:  [birth year]   BSA:          2.118 m Patient Age:    74 years     BP:           107/80 mmHg Patient Gender: M  HR:           129 bpm. Exam Location:  [facility] Procedure: Transesophageal Echo, Cardiac Doppler and Color Doppler Indications:     Not listed on TEE check-in sheet  History:         Patient has prior history of Echocardiogram examinations, most                  recent 10/21/2020. CHF, COPD; Signs/Symptoms:Dyspnea.  Sonographer:     Sherrie Sport RDCS (AE) Referring Phys:  [place] Diagnosing Phys: Serafina Royals MD PROCEDURE: The transesophogeal probe was passed without difficulty through the esophogus of the patient. Sedation performed by performing physician. The patient developed no complications during the procedure. A successful direct current cardioversion was performed at 120 joules with 1 attempt. IMPRESSIONS  1. Left ventricular ejection fraction, by estimation, is 25 to 30%. The left ventricle has severely decreased function. The left ventricle demonstrates global hypokinesis. The left ventricular internal cavity size was mildly dilated.  2. Right ventricular systolic function is normal. The right ventricular size is normal.  3. Left atrial size was moderately dilated. No left atrial/left atrial appendage thrombus was detected.  4. Right atrial size was moderately dilated.  5. The mitral valve is abnormal. Moderate  to severe mitral valve regurgitation.  6. Tricuspid valve regurgitation is moderate to severe.  7. The aortic valve is calcified. Aortic valve regurgitation is mild.  8. Evidence of atrial level shunting detected by color flow Doppler. Agitated saline contrast bubble study was positive with shunting observed within 3-6 cardiac cycles suggestive of interatrial shunt. 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 FINDINGS  Left Ventricle: Left ventricular ejection fraction, by estimation, is 25 to 30%. The left ventricle has severely decreased function. The left ventricle demonstrates global hypokinesis. The left ventricular internal cavity size was mildly dilated. Right Ventricle: The right ventricular size is normal. No increase in right ventricular wall thickness. Right ventricular systolic function is normal. Left Atrium: Left atrial size was moderately dilated. No left atrial/left atrial appendage thrombus was detected. Right Atrium: Right atrial size was moderately dilated. Pericardium: There is no evidence of pericardial effusion. Mitral Valve: The mitral valve is abnormal. 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 Tricuspid Valve: The tricuspid valve is normal in structure. Tricuspid valve regurgitation is moderate to severe. Aortic Valve: The aortic valve is calcified. Aortic valve regurgitation is mild. Pulmonic Valve: The pulmonic valve was normal in structure. Pulmonic valve regurgitation is trivial. Aorta: The aortic root and ascending aorta are structurally normal, with no evidence of dilitation. IAS/Shunts: Evidence of atrial level shunting detected by color flow Doppler. Agitated saline contrast was given intravenously to evaluate for intracardiac shunting. Agitated saline contrast bubble study was positive with shunting observed within 3-6 cardiac cycles suggestive of interatrial shunt. A small patent foramen ovale is detected with  predominantly right to left shunting across the atrial septum. There is no evidence of an atrial septal defect. Serafina Royals MD Electronically signed by Serafina Royals MD Signature Date/Time: 10/24/2020/1:59:29 PM    Final     Telemetry atrial flutter with right bundle branch block and rapid ventricular rate  Weights: Filed Weights   10/24/20 0400 10/24/20 1100 10/25/20 0443  Weight: 100.9 kg 96.2 kg 99.4 kg     Physical Exam: Blood pressure (!) 86/59, pulse 84, temperature 97.8 F (36.6 C), resp. rate 17, height 5\' 9"  (1.753 m), weight 99.4 kg,  SpO2 92 %. Body mass index is 32.37 kg/m. General: Well developed, well nourished, in no acute distress. Head eyes ears nose throat: Normocephalic, atraumatic, sclera non-icteric, no xanthomas, nares are without discharge. No apparent thyromegaly and/or mass  Lungs: Normal respiratory effort.  no wheezes, no rales, no rhonchi.  Heart: Regular with normal S1 S2.  3-4+ apical murmur gallop, no rub, PMI is normal size and placement, carotid upstroke normal without bruit, jugular venous pressure is normal Abdomen: Soft, non-tender, non-distended with normoactive bowel sounds. No hepatomegaly. No rebound/guarding. No obvious abdominal masses. Abdominal aorta is normal size without bruit Extremities: 1+ edema. no cyanosis, no clubbing, no ulcers  Peripheral : 2+ bilateral upper extremity pulses, 2+ bilateral femoral pulses, 2+ bilateral dorsal pedal pulse Neuro: Alert and oriented. No facial asymmetry. No focal deficit. Moves all extremities spontaneously. Musculoskeletal: Normal muscle tone without kyphosis Psych:  Responds to questions appropriately with a normal affect.    Assessment: 75 year old male with valvular heart disease and acute on chronic systolic dysfunction congestive heart failure secondary to atrial flutter with rapid ventricular rate and no current evidence of acute coronary syndrome Now status post electrical cardioversion of  atrial flutter to normal sinus rhythm and slowly improving overall Plan: 1.  Continue metoprolol at 25 mg twice per day although will ambulate before reinstatement of this medication due to electrical cardioversion of normal sinus rhythm 2.  Anticoagulation for further risk reduction of stroke with atrial fibrillation with Eliquis 5 mg twice per day 3.  No further cardiac intervention and/or diagnostics necessary at this time 4.  Continue 40 mg of oral Lasix for congestive heart failure and continued edema 5.  If ambulating well this afternoon and feeling well okay for discharge home from cardiac standpoint and follow-up next week for further adjustments of medication management.  Please call if further questions Signed, Corey Skains M.D. [facility] Cardiology 10/25/2020, 8:29 AM

## 2020-10-25 NOTE — Discharge Instructions (Signed)
Advised to follow up PCP in one week. Advised to follow up Dr. Nehemiah Massed in one week. Advised to take metoprolol 25 mg Bid Advised to take Eliquis 5 mg BID for anticoagulation.

## 2020-10-25 NOTE — Care Management Important Message (Signed)
Important Message  Patient Details  Name: Karl Norman MRN: [id number] Date of Birth: 1946-03-11   Medicare Important Message Given:  Yes     Dannette Barbara 10/25/2020, 12:35 PM

## 2020-10-25 NOTE — Plan of Care (Signed)
DISCHARGE NOTE HOME CRUZE ZINGARO to be discharged home per MD order. Discussed prescriptions and follow up appointments with the patient. Medication list explained in detail. Patient verbalized understanding.  Skin clean, dry and intact without evidence of skin break down, no evidence of skin tears noted. IV catheter discontinued intact. Site without signs and symptoms of complications. Dressing and pressure applied. Pt denies pain at the site currently. No complaints noted.  Patient free of lines, drains, and wounds.   An After Visit Summary (AVS) was printed and given to the patient. Patient escorted via wheelchair, and discharged home via private auto.  Stephan Minister, RN

## 2020-10-28 ENCOUNTER — Telehealth: Payer: Self-pay

## 2020-10-28 NOTE — Telephone Encounter (Signed)
   Post hospital follow up phone call.  Called and wife answered phone, she states he is doing okay.  Legs are still swollen and he is getting around slowly.  States he forgot to weigh himself yesterday.  Instructed to elevate legs as much as possible and also to wear TED sock, she states he has some, not sure if they would fit.  Explained that they would help with return fluid to his circulation.  Also noted on his discharge instructions that Advil/ibuprofen was listed, instructed that with his heart failure it is not a good medication for him to take, recommended tylenol.  She states his prior diet was high in sodium but he states he is going to try to comply to lower sodium.  Instructed would call back in a couple of days to see how he is doing.  Pricilla Riffle RN, CHFN

## 2020-10-30 ENCOUNTER — Telehealth: Payer: Self-pay

## 2020-10-30 NOTE — Telephone Encounter (Signed)
   [facility] phone call  Called again to check on patient.  He still remains very edematous per the wife, legs are weeping and scrotum remains swollen, hard to get around due to discomfort.  Had suggested he wears TEDs to mobilize the fluid but he refuses.  Suggested Ace wraps.    Will not call cardiology as he know his doctor is out of town.  See's Otila Kluver in HF clinic on 2/14 and Dr. Nehemiah Massed on the 17.  Weight this AM was 210# but is not consistent- one day weighs in the nude next with clothes on.  Reminded he has blood work due in one week.  Pricilla Riffle RN, CHFN

## 2020-11-04 ENCOUNTER — Encounter: Payer: Self-pay | Admitting: Family

## 2020-11-04 ENCOUNTER — Ambulatory Visit
Admission: RE | Admit: 2020-11-04 | Discharge: 2020-11-04 | Disposition: A | Discharge status: Home / Self Care | Payer: Medicare Other | Source: Ambulatory Visit | Attending: Family | Admitting: Family

## 2020-11-04 ENCOUNTER — Other Ambulatory Visit: Payer: Self-pay | Admitting: Family

## 2020-11-04 ENCOUNTER — Other Ambulatory Visit: Payer: Self-pay

## 2020-11-04 ENCOUNTER — Ambulatory Visit: Payer: Medicare Other | Admitting: Family

## 2020-11-04 VITALS — BP 158/81 | HR 93 | Resp 18 | Ht 68.0 in | Wt 230.1 lb

## 2020-11-04 DIAGNOSIS — Z880 Allergy status to penicillin: Secondary | ICD-10-CM | POA: Insufficient documentation

## 2020-11-04 DIAGNOSIS — Z7901 Long term (current) use of anticoagulants: Secondary | ICD-10-CM | POA: Diagnosis not present

## 2020-11-04 DIAGNOSIS — I4891 Unspecified atrial fibrillation: Secondary | ICD-10-CM | POA: Insufficient documentation

## 2020-11-04 DIAGNOSIS — Z79899 Other long term (current) drug therapy: Secondary | ICD-10-CM | POA: Insufficient documentation

## 2020-11-04 DIAGNOSIS — Z87891 Personal history of nicotine dependence: Secondary | ICD-10-CM | POA: Insufficient documentation

## 2020-11-04 DIAGNOSIS — R140 Abdominal distension (gaseous): Secondary | ICD-10-CM | POA: Insufficient documentation

## 2020-11-04 DIAGNOSIS — I5023 Acute on chronic systolic (congestive) heart failure: Secondary | ICD-10-CM | POA: Diagnosis present

## 2020-11-04 DIAGNOSIS — I480 Paroxysmal atrial fibrillation: Secondary | ICD-10-CM

## 2020-11-04 DIAGNOSIS — M549 Dorsalgia, unspecified: Secondary | ICD-10-CM | POA: Insufficient documentation

## 2020-11-04 DIAGNOSIS — I110 Hypertensive heart disease with heart failure: Secondary | ICD-10-CM | POA: Insufficient documentation

## 2020-11-04 DIAGNOSIS — I10 Essential (primary) hypertension: Secondary | ICD-10-CM

## 2020-11-04 LAB — CBC
HCT: 36.1 % — ABNORMAL LOW (ref 39.0–52.0)
Hemoglobin: 10.9 g/dL — ABNORMAL LOW (ref 13.0–17.0)
MCH: 24.6 pg — ABNORMAL LOW (ref 26.0–34.0)
MCHC: 30.2 g/dL (ref 30.0–36.0)
MCV: 81.5 fL (ref 80.0–100.0)
Platelets: 210 10*3/uL (ref 150–400)
RBC: 4.43 MIL/uL (ref 4.22–5.81)
RDW: 16.5 % — ABNORMAL HIGH (ref 11.5–15.5)
WBC: 6.1 10*3/uL (ref 4.0–10.5)
nRBC: 0 % (ref 0.0–0.2)

## 2020-11-04 LAB — BASIC METABOLIC PANEL
Anion gap: 12 (ref 5–15)
BUN: 10 mg/dL (ref 8–23)
CO2: 29 mmol/L (ref 22–32)
Calcium: 8.9 mg/dL (ref 8.9–10.3)
Chloride: 99 mmol/L (ref 98–111)
Creatinine, Ser: 0.97 mg/dL (ref 0.61–1.24)
GFR, Estimated: >60 mL/min (ref >60)
Glucose, Bld: 102 mg/dL — ABNORMAL HIGH (ref 70–99)
Potassium: 4.8 mmol/L (ref 3.5–5.1)
Sodium: 140 mmol/L (ref 135–145)

## 2020-11-04 LAB — BRAIN NATRIURETIC PEPTIDE: B Natriuretic Peptide: 1346 pg/mL — ABNORMAL HIGH (ref 0.0–100.0)

## 2020-11-04 MED ORDER — FUROSEMIDE 10 MG/ML IJ SOLN
80.0000 mg | Freq: Once | INTRAMUSCULAR | Status: AC
  Administered 2020-11-04: 80 mg via INTRAVENOUS

## 2020-11-04 NOTE — Patient Instructions (Signed)
Continue weighing daily and call for an overnight weight gain of > 2 pounds or a weekly weight gain of >5 pounds. 

## 2020-11-04 NOTE — Discharge Instructions (Signed)
Heart Failure Exacerbation  Heart failure is a condition in which the heart has trouble pumping blood. This may mean that the heart cannot pump enough blood out to the body or that the heart does not fill up with enough blood. When this happens, parts of the body do not get the blood and oxygen they need to function properly. This can cause symptoms such as breathing problems, tiredness (fatigue), swelling, and confusion. Heart failure exacerbation refers to heart failure symptoms that get worse. The symptoms may get worse suddenly or develop slowly over time. Heart failure exacerbation is a serious medical problem that should be treated right away. What are the causes? A heart failure exacerbation can be triggered by:  Not taking your heart failure medicines correctly.  Infections.  Eating an unhealthy diet or a diet that is high in salt (sodium).  Drinking too much fluid.  Drinking alcohol.  Using drugs, such as cocaine or methamphetamine.  Not exercising. Other causes include:  Other heart conditions such as an irregular heart rhythm (arrhythmia).  Worsening heart valve function.  Low blood counts (anemia).  Other medical problems, such as kidney failure, thyroid problems, or diabetes mellitus. Sometimes the cause of the exacerbation is not known. What are the signs or symptoms? When heart failure symptoms suddenly or slowly get worse, this may be a sign of heart failure exacerbation. Symptoms of heart failure include:  Shortness of breath during activity or exercise.  A cough that does not go away.  Swelling of the legs, ankles, feet, or abdomen.  Losing or gaining weight for no reason.  Trouble breathing when lying down.  Increased heart rate or irregular heartbeat.  Fatigue.  Feeling light-headed, dizzy, or close to fainting.  Nausea or lack of appetite. How is this diagnosed? This condition is diagnosed based on:  Your symptoms and medical history.  A  physical exam. You may also have tests, including:  Electrocardiogram (ECG). This test measures the electrical activity of your heart.  Echocardiogram. This test uses sound waves to take a picture of your heart to see how well it works.  Blood tests.  Imaging tests, such as: ? Chest X-ray. ? MRI. ? Ultrasound.  Stress test. This test examines how well your heart functions while you exercise on a treadmill or exercise bike. If you cannot exercise, medicines may be used to increase your heartbeat in place of exercise.  Cardiac catheterization. During this test, a thin, flexible tube (catheter) is inserted into a blood vessel and threaded up to your heart. This test allows your health care provider to check the arteries that lead to your heart (coronary arteries).  Right heart catheterization. During this test, the pressure in your heart is measured. How is this treated? This condition may be treated by:  Adjusting your heart medicines.  Maintaining a healthy lifestyle. This includes: ? Eating a heart-healthy diet that is low in sodium. ? Not using products that contain nicotine or tobacco. ? Regular exercise. ? Monitoring your fluid intake. ? Monitoring your weight and reporting changes to your health care provider. ? Not using alcohol or drugs.  Treating sleep apnea, if you have this condition.  Surgery. This may include: ? Placing a pacemaker to improve heart function (cardiac resynchronization therapy). ? Implanting a device that can correct heart rhythm problems (implantable cardioverter defibrillator). ? Implanting a pulmonary arterial pressure monitor to monitor your fluid balance. ? Connecting a device to your heart to help it pump blood (ventricular assist device). ?   Heart transplant. Follow these instructions at home: Medicines  Take over-the-counter and prescription medicines only as told by your health care provider.  Do not stop taking your medicines or change  the amount you take. If you are having problems or side effects from your medicines, talk to your health care provider.  If you are having difficulty paying for your medicines, contact a social worker or your clinic. There are many programs to assist with medicine costs.  Talk to your health care provider before starting any new medicines or supplements.  Make sure your health care provider and pharmacist have a list of all the medicines you are taking. Eating and drinking  Avoid drinking alcohol.  Eat a heart-healthy diet as told by your health care provider. This includes: ? Plenty of fruits and vegetables. ? Lean proteins. ? Low-fat dairy. ? Whole grains. ? Foods that are low in sodium.   Activity  Exercise regularly as told by your health care provider. Balance exercise with rest.  Ask your health care provider what activities are safe for you. This includes sexual activity, exercise, and daily tasks at home or work.   Lifestyle  Do not use any products that contain nicotine or tobacco. These products include cigarettes, chewing tobacco, and vaping devices, such as e-cigarettes. If you need help quitting, ask your health care provider.  Maintain a healthy weight. Ask your health care provider what weight is healthy for you.  Consider joining a patient support group. This can help with emotional problems you may have, such as stress and anxiety.  Do not use drugs. General instructions  Stay up to date with vaccines. Talk to your health care provider about flu and pneumonia vaccines.  Keep a list of medicines that you are taking. This may help in emergency situations.  Keep all follow-up visits. This is important. Contact a health care provider if:  You have questions about your medicines or you miss a dose.  You feel anxious, depressed, or stressed.  You develop swelling in your feet, ankles, legs, or abdomen.  You develop a cough.  You have a fever.  You have  trouble sleeping.  You gain 2-3 lb (1-1.4 kg) in 24 hours or 5 lb (2.3 kg) in a week. Get help right away if:  You have chest pain or pressure.  You have shortness of breath while resting.  You have severe fatigue.  You are confused.  You have severe dizziness.  You have a rapid or irregular heartbeat.  You have nausea or you vomit.  You have a cough that is worse at night or you cannot lie flat.  You have severe depression or sadness. These symptoms may represent a serious problem that is an emergency. Do not wait to see if the symptoms will go away. Get medical help right away. Call your local emergency services (911 in the [place] Do not drive yourself to the hospital. Summary  When heart failure symptoms get worse, it is called heart failure exacerbation.  Common causes of this condition include taking medicines incorrectly, infections, and drinking alcohol.  This condition may be treated by adjusting medicines, maintaining a healthy lifestyle, or surgery.  Do not stop taking your medicines or change the amount you take. If you are having problems or side effects from your medicines, talk to your health care provider. This information is not intended to replace advice given to you by your health care provider. Make sure you discuss any questions you have with   your health care provider. Document Revised: 03/30/2020 Document Reviewed: 03/30/2020 Elsevier Patient Education  2021 [employer].  

## 2020-11-04 NOTE — Progress Notes (Signed)
Patient ID: Karl Norman, male    DOB: [birth year], 75 y.o.   MRN: [id number]  HPI  Karl Norman is a 75 y/o male with a history of atrial fibrillation, HTN, GERD, COPD, previous tobacco use and chronic heart failure.   Echo report from 10/24/20 reviewed and showed an EF of 25-30% along with moderate/ severe Karl/ TR and mild AR.   Admitted 10/21/20 due to shortness of breath. Initially needed bipap due to hypoxia. AF with AVR. Initially given IV lasix with transition to oral diuretics. Cardiology consult obtained. Successfully cardioverted from AF. Kayexalate given for hyperkalemia (6.4). Discharged after 4 days.   He presents today for his initial visit with a chief complaint of moderate shortness of breath with little exertion. Says this has been present for several weeks and isn't any better since his recent discharge. Was able to walk to the office but was short of breath although recovered quickly once seated for a few minutes. He has associated fatigue, pedal edema, abdominal distention, back pain, weight gain and light-headedness along with this. He denies any difficulty sleeping (sleeping on a wedge), palpitations, chest pain or cough.   Does walk his dogs 3 times a day and walks ~ 150-200 yards each time but is quite short of breath while doing this. Used to golf numerous times a week.   Past Medical History:  Diagnosis Date  . Arrhythmia    atrial fibrillation  . CHF (congestive heart failure) ([place])    had three tubes to drain heart 2011  . COPD (chronic obstructive pulmonary disease) ([place])   . Dyspnea    with exertion  . GERD (gastroesophageal reflux disease)   . Hypertension   . 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 Pancreatitis    Past Surgical History:  Procedure Laterality Date  . CARDIOVERSION N/A 10/24/2020   Procedure: CARDIOVERSION;  Surgeon: Corey Skains, MD;  Location: [facility] ORS;  Service: Cardiovascular;  Laterality: N/A;   . COLONOSCOPY WITH PROPOFOL N/A 08/15/2018   Procedure: COLONOSCOPY WITH PROPOFOL;  Surgeon: Manya Silvas, MD;  Location: [facility] ENDOSCOPY;  Service: Endoscopy;  Laterality: N/A;  . ERCP with stent  07/22/2018  . REPLACEMENT TOTAL KNEE    . TEE WITHOUT CARDIOVERSION N/A 11/11/2015   Procedure: TRANSESOPHAGEAL ECHOCARDIOGRAM (TEE);  Surgeon: Corey Skains, MD;  Location: [facility] ORS;  Service: Cardiovascular;  Laterality: N/A;  . TEE WITHOUT CARDIOVERSION N/A 10/24/2020   Procedure: TRANSESOPHAGEAL ECHOCARDIOGRAM (TEE);  Surgeon: Corey Skains, MD;  Location: [facility] ORS;  Service: Cardiovascular;  Laterality: N/A;   History reviewed. No pertinent family history. Social History   Tobacco Use  . Smoking status: Former Research scientist (life sciences)  . Smokeless tobacco: Never Used  Substance Use Topics  . Alcohol use: No   Allergies  Allergen Reactions  . Penicillins     Has patient had a PCN reaction causing immediate rash, facial/tongue/throat swelling, SOB or lightheadedness with hypotension: NO Has patient had a PCN reaction causing severe rash involving mucus membranes or skin necrosis: YES Has patient had a PCN reaction that required hospitalization: YES Has patient had a PCN reaction occurring within the last 10 years: NO If all of the above answers are "NO", then may proceed with Cephalosporin use.    Prior to Admission medications   Medication Sig Start Date End Date Taking? Authorizing Provider  apixaban (ELIQUIS) 5 MG TABS tablet Take 1 tablet (5 mg total) by mouth 2 (two) times  daily. 10/25/20  Yes Shawna Clamp, MD  b complex vitamins capsule Take 1 capsule by mouth daily.   Yes [provider]  furosemide (LASIX) 40 MG tablet Take 1 tablet (40 mg total) by mouth daily. 10/26/20  Yes Shawna Clamp, MD  ibuprofen (ADVIL) 200 MG tablet Take 400 mg by mouth every 6 (six) hours as needed for mild pain.   Yes [provider]  metoprolol tartrate (LOPRESSOR) 25 MG tablet Take 1  tablet (25 mg total) by mouth 2 (two) times daily. 10/25/20  Yes Shawna Clamp, MD  omeprazole (PRILOSEC) 20 MG capsule Take 20 mg by mouth daily.   Yes [provider]  simethicone (MYLICON) 80 MG chewable tablet Chew 80 mg by mouth every 6 (six) hours as needed for flatulence.   Yes [provider]  vitamin B-12 (CYANOCOBALAMIN) 1000 MCG tablet Take 1,000 mcg by mouth daily.   Yes [provider]    Review of Systems  Constitutional: Positive for fatigue (easily). Negative for appetite change.  HENT: Negative for congestion, postnasal drip and sore throat.   Eyes: Negative.   Respiratory: Positive for shortness of breath (easily). Negative for cough and chest tightness.   Cardiovascular: Positive for leg swelling. Negative for chest pain and palpitations.  Gastrointestinal: Positive for abdominal distention. Negative for abdominal pain.  Endocrine: Negative.   Genitourinary: Negative.   Musculoskeletal: Positive for back pain.  Skin: Negative.   Allergic/Immunologic: Negative.   Neurological: Positive for light-headedness (with change of position too quickly). Negative for dizziness.  Hematological: Negative for adenopathy. Does not bruise/bleed easily.  Psychiatric/Behavioral: Negative for dysphoric mood and sleep disturbance (sleeping on wedge). The patient is not nervous/anxious.    Vitals:   11/04/20 1158  BP: (!) 158/81  Pulse: 93  Resp: 18  SpO2: 96%  Weight: 230 lb 2 oz (104.4 kg)  Height: 5\' 8"  (1.727 m)   Wt Readings from Last 3 Encounters:  11/04/20 230 lb 2 oz (104.4 kg)  10/25/20 219 lb 3.2 oz (99.4 kg)  11/24/18 191 lb (86.6 kg)   Lab Results  Component Value Date   CREATININE 2.00 (H) 10/25/2020   CREATININE 1.44 (H) 10/24/2020   CREATININE 1.61 (H) 10/24/2020    Physical Exam Vitals and nursing note reviewed.  Constitutional:      Appearance: He is well-developed.  HENT:     Head: Normocephalic and atraumatic.  Neck:      Vascular: No JVD.  Cardiovascular:     Rate and Rhythm: Normal rate and regular rhythm.  Pulmonary:     Effort: Pulmonary effort is normal. No respiratory distress.     Breath sounds: Examination of the right-upper field reveals rales. Examination of the right-lower field reveals wheezing. Examination of the left-lower field reveals wheezing. Wheezing (expiratory) and rales present.  Abdominal:     Palpations: Abdomen is soft.     Tenderness: There is no abdominal tenderness.  Musculoskeletal:     Cervical back: Neck supple.     Right lower leg: No tenderness. Edema (3+ pitting) present.     Left lower leg: No tenderness. Edema (3+ pitting) present.  Skin:    General: Skin is warm and dry.  Neurological:     General: No focal deficit present.     Mental Status: He is alert and oriented to person, place, and time.  Psychiatric:        Mood and Affect: Mood normal.        Behavior: Behavior normal.  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- - NYHA class III - moderately fluid overloaded with 3+ pitting edema, crackles and shortness of breath - will send for 80mg  IV lasix (no potassium since previously hyperkalemic) - will get BMP, BNP & CBC today as well - weighing daily and says that his weight has risen since discharge; reminded to call for an overnight weight gain of > 2 pounds or a weekly weight gain of > 5 pounds - not adding salt and has been looking at food labels; says that he doesn't eat much processed or canned foods; eats seafood ~ twice/ month and is aware that he'll get more salt intake during those meals - sees cardiology Nehemiah Massed) 11/07/20 - used to golf but now gets short of breath easily - depending on renal function/ potassium, could benefit from entresto, farxiga or jardiance; consider changing his metoprolol tartrate to either succinate or carvedilol; spironolactone if able, depending on future lab results - BNP 10/21/20  was 661.4  2: HTN- - BP mildly elevated; giving IV lasix per above - saw PCP ([place]) 07/10/20 & returns 11/06/20 - BMP 10/25/20 reviewed and showed sodium 131, potassium 5.2, creatinine 2.0 and GFR 35  3: AF- - cardioverted during Jan 2022 admission - regular rhythm today   Patient did not bring his medications nor a list. Each medication was verbally reviewed with the patient and he was encouraged to bring the bottles to every visit to confirm accuracy of list.  Return tomorrow to reassess IV lasix response.

## 2020-11-04 NOTE — Progress Notes (Signed)
Patient ID: Karl Norman, male    DOB: 07-Apr-1946, 75 y.o.   MRN: [id number]  HPI  Karl Norman is a 75 y/o male with a history of atrial fibrillation, HTN, GERD, COPD, previous tobacco use and chronic heart failure.   Echo report from 10/24/20 reviewed and showed an EF of 25-30% along with moderate/ severe Karl/ TR and mild AR.   Admitted 10/21/20 due to shortness of breath. Initially needed bipap due to hypoxia. AF with AVR. Initially given IV lasix with transition to oral diuretics. Cardiology consult obtained. Successfully cardioverted from AF. Kayexalate given for hyperkalemia (6.4). Discharged after 4 days.   He presents today for a follow-up visit with a chief complaint of moderate shortness of breath with little exertion. This has been present for several months. He has associated fatigue, light-headedness, pedal edema (improving), abdominal distention (improving) and back pain along with this. He denies any difficulty sleeping, palpitations, chest pain, cough or weight gain.   He received 80mg  IV lasix yesterday and reports feeling "much better". Says that his scrotal edema has completely resolved and his thighs feel softer. Reports being more comfortable walking to the office today.   Does walk his dogs 3 times a day and walks ~ 150-200 yards each time but is quite short of breath while doing this. Used to golf numerous times a week.   Past Medical History:  Diagnosis Date  . Arrhythmia    atrial fibrillation  . CHF (congestive heart failure) ([place])    had three tubes to drain heart 2011  . COPD (chronic obstructive pulmonary disease) ([place])   . Dyspnea    with exertion  . GERD (gastroesophageal reflux disease)   . Hypertension   . 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 Pancreatitis    Past Surgical History:  Procedure Laterality Date  . CARDIOVERSION N/A 10/24/2020   Procedure: CARDIOVERSION;  Surgeon: Corey Skains, MD;  Location: [facility]  ORS;  Service: Cardiovascular;  Laterality: N/A;  . COLONOSCOPY WITH PROPOFOL N/A 08/15/2018   Procedure: COLONOSCOPY WITH PROPOFOL;  Surgeon: Manya Silvas, MD;  Location: [facility] ENDOSCOPY;  Service: Endoscopy;  Laterality: N/A;  . ERCP with stent  07/22/2018  . REPLACEMENT TOTAL KNEE    . TEE WITHOUT CARDIOVERSION N/A 11/11/2015   Procedure: TRANSESOPHAGEAL ECHOCARDIOGRAM (TEE);  Surgeon: Corey Skains, MD;  Location: [facility] ORS;  Service: Cardiovascular;  Laterality: N/A;  . TEE WITHOUT CARDIOVERSION N/A 10/24/2020   Procedure: TRANSESOPHAGEAL ECHOCARDIOGRAM (TEE);  Surgeon: Corey Skains, MD;  Location: [facility] ORS;  Service: Cardiovascular;  Laterality: N/A;   No family history on file. Social History   Tobacco Use  . Smoking status: Former Research scientist (life sciences)  . Smokeless tobacco: Never Used  Substance Use Topics  . Alcohol use: No   Allergies  Allergen Reactions  . Penicillins     Has patient had a PCN reaction causing immediate rash, facial/tongue/throat swelling, SOB or lightheadedness with hypotension: NO Has patient had a PCN reaction causing severe rash involving mucus membranes or skin necrosis: YES Has patient had a PCN reaction that required hospitalization: YES Has patient had a PCN reaction occurring within the last 10 years: NO If all of the above answers are "NO", then may proceed with Cephalosporin use.    Prior to Admission medications   Medication Sig Start Date End Date Taking? Authorizing Provider  apixaban (ELIQUIS) 5 MG TABS tablet Take 1 tablet (5 mg total) by mouth 2 (  two) times daily. 10/25/20  Yes Shawna Clamp, MD  b complex vitamins capsule Take 1 capsule by mouth daily.   Yes [provider]  ferrous sulfate 324 MG TBEC Take 324 mg by mouth daily as needed.   Yes [provider]  furosemide (LASIX) 40 MG tablet Take 1 tablet (40 mg total) by mouth daily. 10/26/20  Yes Shawna Clamp, MD  ibuprofen (ADVIL) 200 MG tablet Take 400 mg by mouth every  6 (six) hours as needed for mild pain.   Yes [provider]  metoprolol tartrate (LOPRESSOR) 25 MG tablet Take 1 tablet (25 mg total) by mouth 2 (two) times daily. 10/25/20  Yes Shawna Clamp, MD  omeprazole (PRILOSEC) 20 MG capsule Take 20 mg by mouth daily.   Yes [provider]  simethicone (MYLICON) 80 MG chewable tablet Chew 80 mg by mouth every 6 (six) hours as needed for flatulence.   Yes [provider]  vitamin B-12 (CYANOCOBALAMIN) 1000 MCG tablet Take 1,000 mcg by mouth daily.   Yes [provider]    Review of Systems  Constitutional: Positive for fatigue (easily). Negative for appetite change.  HENT: Negative for congestion, postnasal drip and sore throat.   Eyes: Negative.   Respiratory: Positive for shortness of breath (easily). Negative for cough and chest tightness.   Cardiovascular: Positive for leg swelling (improving). Negative for chest pain and palpitations.  Gastrointestinal: Positive for abdominal distention (improving). Negative for abdominal pain.  Endocrine: Negative.   Genitourinary: Negative.   Musculoskeletal: Positive for back pain.  Skin: Negative.   Allergic/Immunologic: Negative.   Neurological: Positive for light-headedness (with change of position too quickly). Negative for dizziness.  Hematological: Negative for adenopathy. Does not bruise/bleed easily.  Psychiatric/Behavioral: Negative for dysphoric mood and sleep disturbance (sleeping on wedge). The patient is not nervous/anxious.    Vitals:   11/05/20 1141  BP: 133/81  Pulse: 83  Resp: 18  SpO2: 94%  Weight: 222 lb (100.7 kg)  Height: 5\' 8"  (1.727 m)   Wt Readings from Last 3 Encounters:  11/05/20 222 lb (100.7 kg)  11/04/20 230 lb 2 oz (104.4 kg)  10/25/20 219 lb 3.2 oz (99.4 kg)   Lab Results  Component Value Date   CREATININE 1.06 11/05/2020   CREATININE 0.97 11/04/2020   CREATININE 2.00 (H) 10/25/2020    Physical Exam Vitals and nursing note  reviewed.  Constitutional:      Appearance: He is well-developed.  HENT:     Head: Normocephalic and atraumatic.  Neck:     Vascular: No JVD.  Cardiovascular:     Rate and Rhythm: Normal rate and regular rhythm.  Pulmonary:     Effort: Pulmonary effort is normal. No respiratory distress.     Breath sounds: Examination of the right-upper field reveals rales. Rales (LLL) present. No wheezing.  Abdominal:     General: There is no distension.     Palpations: Abdomen is soft.     Tenderness: There is no abdominal tenderness.  Musculoskeletal:     Cervical back: Neck supple.     Right lower leg: No tenderness. Edema (3+ pitting) present.     Left lower leg: No tenderness. Edema (3+ pitting) present.     Comments: Legs feel slightly softer  Skin:    General: Skin is warm and dry.  Neurological:     General: No focal deficit present.     Mental Status: He is alert and oriented to person, place, and time.  Psychiatric:        Mood and Affect: Mood normal.        Behavior: Behavior normal.    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- - NYHA class III - continued to be moderately fluid overloaded with 3+ pitting edema, crackles and shortness of breath although better from yesterday - received 80mg  IV lasix (no potassium since previously hyperkalemic) yesterday - weighing daily; reminded to call for an overnight weight gain of > 2 pounds or a weekly weight gain of > 5 pounds - weight down 8 pounds from last visit here yesterday - will send back for 80mg  IV Lasix today - will get BMP/ BNP today - not adding salt and has been looking at food labels; says that he doesn't eat much processed or canned foods; eats seafood ~ twice/ month and is aware that he'll get more salt intake during those meals - sees cardiology Nehemiah Massed) 11/07/20 - used to golf but now gets short of breath easily - discussed that at future visits depending on renal function/  potassium, could benefit from entresto, farxiga or jardiance; consider changing his metoprolol tartrate to either succinate or carvedilol; spironolactone if able - BNP 11/04/20 was 1346.0  2: HTN- - BP looks good today - saw PCP ([facility]) 07/10/20 & returns 11/06/20 - BMP 11/04/20 reviewed and showed sodium 140, potassium 4.8, creatinine 0.97 and GFR >60  3: AF- - cardioverted during Jan 2022 admission - regular rhythm today  Discussed that we could order IV lasix again this week if symptoms persist as long as lab work allows. Patient to call after his appointment with Dr. Doy Hutching / Nehemiah Massed if they would like [place] to order more IV lasix.   Patient did not bring his medications nor a list. Each medication was verbally reviewed with the patient and he was encouraged to bring the bottles to every visit to confirm accuracy of list.  Return next week or sooner for any questions/problems before then.

## 2020-11-05 ENCOUNTER — Other Ambulatory Visit: Payer: Self-pay | Admitting: Family

## 2020-11-05 ENCOUNTER — Encounter: Payer: Self-pay | Admitting: Family

## 2020-11-05 ENCOUNTER — Ambulatory Visit
Admission: RE | Admit: 2020-11-05 | Discharge: 2020-11-05 | Disposition: A | Discharge status: Home / Self Care | Payer: Medicare Other | Source: Ambulatory Visit | Attending: Family | Admitting: Family

## 2020-11-05 ENCOUNTER — Ambulatory Visit: Payer: Medicare Other | Admitting: Family

## 2020-11-05 VITALS — BP 133/81 | HR 83 | Resp 18 | Ht 68.0 in | Wt 222.0 lb

## 2020-11-05 DIAGNOSIS — I110 Hypertensive heart disease with heart failure: Secondary | ICD-10-CM | POA: Insufficient documentation

## 2020-11-05 DIAGNOSIS — Z7901 Long term (current) use of anticoagulants: Secondary | ICD-10-CM | POA: Insufficient documentation

## 2020-11-05 DIAGNOSIS — I480 Paroxysmal atrial fibrillation: Secondary | ICD-10-CM

## 2020-11-05 DIAGNOSIS — R42 Dizziness and giddiness: Secondary | ICD-10-CM | POA: Insufficient documentation

## 2020-11-05 DIAGNOSIS — Z87891 Personal history of nicotine dependence: Secondary | ICD-10-CM | POA: Diagnosis not present

## 2020-11-05 DIAGNOSIS — R140 Abdominal distension (gaseous): Secondary | ICD-10-CM | POA: Insufficient documentation

## 2020-11-05 DIAGNOSIS — J449 Chronic obstructive pulmonary disease, unspecified: Secondary | ICD-10-CM | POA: Insufficient documentation

## 2020-11-05 DIAGNOSIS — R0602 Shortness of breath: Secondary | ICD-10-CM | POA: Insufficient documentation

## 2020-11-05 DIAGNOSIS — K219 Gastro-esophageal reflux disease without esophagitis: Secondary | ICD-10-CM | POA: Insufficient documentation

## 2020-11-05 DIAGNOSIS — I5023 Acute on chronic systolic (congestive) heart failure: Secondary | ICD-10-CM | POA: Diagnosis present

## 2020-11-05 DIAGNOSIS — Z880 Allergy status to penicillin: Secondary | ICD-10-CM | POA: Insufficient documentation

## 2020-11-05 DIAGNOSIS — M549 Dorsalgia, unspecified: Secondary | ICD-10-CM | POA: Insufficient documentation

## 2020-11-05 DIAGNOSIS — I10 Essential (primary) hypertension: Secondary | ICD-10-CM

## 2020-11-05 LAB — BASIC METABOLIC PANEL
Anion gap: 10 (ref 5–15)
BUN: 12 mg/dL (ref 8–23)
CO2: 30 mmol/L (ref 22–32)
Calcium: 8.9 mg/dL (ref 8.9–10.3)
Chloride: 98 mmol/L (ref 98–111)
Creatinine, Ser: 1.06 mg/dL (ref 0.61–1.24)
GFR, Estimated: >60 mL/min (ref >60)
Glucose, Bld: 98 mg/dL (ref 70–99)
Potassium: 3.9 mmol/L (ref 3.5–5.1)
Sodium: 138 mmol/L (ref 135–145)

## 2020-11-05 LAB — BRAIN NATRIURETIC PEPTIDE: B Natriuretic Peptide: 1326.8 pg/mL — ABNORMAL HIGH (ref 0.0–100.0)

## 2020-11-05 MED ORDER — SODIUM CHLORIDE FLUSH 0.9 % IV SOLN
INTRAVENOUS | Status: AC
  Filled 2020-11-05: qty 10

## 2020-11-05 MED ORDER — FUROSEMIDE 10 MG/ML IJ SOLN
INTRAMUSCULAR | Status: AC
  Administered 2020-11-05: 80 mg via INTRAVENOUS
  Filled 2020-11-05: qty 8

## 2020-11-05 MED ORDER — FUROSEMIDE 10 MG/ML IJ SOLN: 80.0000 mg | Freq: Once | INTRAMUSCULAR | Status: AC

## 2020-11-05 NOTE — Patient Instructions (Signed)
Continue weighing daily and call for an overnight weight gain of > 2 pounds or a weekly weight gain of >5 pounds. 

## 2020-11-05 NOTE — Progress Notes (Signed)
Seeing pcp tomorrow, will check to see if he can come back again for another injection of Lasix.

## 2020-11-06 ENCOUNTER — Other Ambulatory Visit: Payer: Self-pay

## 2020-11-06 ENCOUNTER — Other Ambulatory Visit: Payer: Self-pay | Admitting: Family

## 2020-11-06 ENCOUNTER — Ambulatory Visit
Admission: RE | Admit: 2020-11-06 | Discharge: 2020-11-06 | Disposition: A | Discharge status: Home / Self Care | Payer: Medicare Other | Source: Ambulatory Visit | Attending: Family | Admitting: Family

## 2020-11-06 DIAGNOSIS — I5023 Acute on chronic systolic (congestive) heart failure: Secondary | ICD-10-CM

## 2020-11-06 DIAGNOSIS — I10 Essential (primary) hypertension: Secondary | ICD-10-CM

## 2020-11-06 LAB — BRAIN NATRIURETIC PEPTIDE: B Natriuretic Peptide: 1203.3 pg/mL — ABNORMAL HIGH (ref 0.0–100.0)

## 2020-11-06 LAB — BASIC METABOLIC PANEL
Anion gap: 8 (ref 5–15)
BUN: 11 mg/dL (ref 8–23)
CO2: 33 mmol/L — ABNORMAL HIGH (ref 22–32)
Calcium: 8.7 mg/dL — ABNORMAL LOW (ref 8.9–10.3)
Chloride: 95 mmol/L — ABNORMAL LOW (ref 98–111)
Creatinine, Ser: 0.95 mg/dL (ref 0.61–1.24)
GFR, Estimated: >60 mL/min (ref >60)
Glucose, Bld: 107 mg/dL — ABNORMAL HIGH (ref 70–99)
Potassium: 4.3 mmol/L (ref 3.5–5.1)
Sodium: 136 mmol/L (ref 135–145)

## 2020-11-06 MED ORDER — SODIUM CHLORIDE FLUSH 0.9 % IV SOLN
INTRAVENOUS | Status: AC
  Administered 2020-11-06: 10 mL
  Filled 2020-11-06: qty 10

## 2020-11-06 MED ORDER — POTASSIUM CHLORIDE CRYS ER 20 MEQ PO TBCR: 40.0000 meq | EXTENDED_RELEASE_TABLET | Freq: Once | ORAL | Status: AC

## 2020-11-06 MED ORDER — FUROSEMIDE 10 MG/ML IJ SOLN
INTRAMUSCULAR | Status: AC
  Administered 2020-11-06: 80 mg via INTRAVENOUS
  Filled 2020-11-06: qty 8

## 2020-11-06 MED ORDER — POTASSIUM CHLORIDE CRYS ER 20 MEQ PO TBCR
EXTENDED_RELEASE_TABLET | ORAL | Status: AC
  Administered 2020-11-06: 40 meq via ORAL
  Filled 2020-11-06: qty 2

## 2020-11-06 MED ORDER — FUROSEMIDE 10 MG/ML IJ SOLN: 80.0000 mg | Freq: Once | INTRAMUSCULAR | Status: AC

## 2020-11-07 ENCOUNTER — Other Ambulatory Visit: Payer: Self-pay | Admitting: Family

## 2020-11-07 ENCOUNTER — Ambulatory Visit
Admission: RE | Admit: 2020-11-07 | Discharge: 2020-11-07 | Disposition: A | Discharge status: Home / Self Care | Payer: Medicare Other | Source: Ambulatory Visit | Attending: Family | Admitting: Family

## 2020-11-07 DIAGNOSIS — I5023 Acute on chronic systolic (congestive) heart failure: Secondary | ICD-10-CM | POA: Diagnosis not present

## 2020-11-07 LAB — BRAIN NATRIURETIC PEPTIDE: B Natriuretic Peptide: 1090.7 pg/mL — ABNORMAL HIGH (ref 0.0–100.0)

## 2020-11-07 LAB — BASIC METABOLIC PANEL
Anion gap: 10 (ref 5–15)
BUN: 12 mg/dL (ref 8–23)
CO2: 32 mmol/L (ref 22–32)
Calcium: 8.7 mg/dL — ABNORMAL LOW (ref 8.9–10.3)
Chloride: 94 mmol/L — ABNORMAL LOW (ref 98–111)
Creatinine, Ser: 1.13 mg/dL (ref 0.61–1.24)
GFR, Estimated: >60 mL/min (ref >60)
Glucose, Bld: 122 mg/dL — ABNORMAL HIGH (ref 70–99)
Potassium: 4.2 mmol/L (ref 3.5–5.1)
Sodium: 136 mmol/L (ref 135–145)

## 2020-11-07 MED ORDER — FUROSEMIDE 10 MG/ML IJ SOLN
INTRAMUSCULAR | Status: AC
  Administered 2020-11-07: 80 mg via INTRAVENOUS
  Filled 2020-11-07: qty 8

## 2020-11-07 MED ORDER — POTASSIUM CHLORIDE CRYS ER 20 MEQ PO TBCR: 40.0000 meq | EXTENDED_RELEASE_TABLET | Freq: Once | ORAL | Status: AC

## 2020-11-07 MED ORDER — POTASSIUM CHLORIDE CRYS ER 20 MEQ PO TBCR
EXTENDED_RELEASE_TABLET | ORAL | Status: AC
  Administered 2020-11-07: 40 meq via ORAL
  Filled 2020-11-07: qty 2

## 2020-11-07 MED ORDER — FUROSEMIDE 10 MG/ML IJ SOLN: 80.0000 mg | Freq: Once | INTRAMUSCULAR | Status: AC

## 2020-11-13 ENCOUNTER — Encounter: Payer: Self-pay | Admitting: Family

## 2020-11-13 ENCOUNTER — Other Ambulatory Visit: Payer: Self-pay

## 2020-11-13 ENCOUNTER — Ambulatory Visit: Payer: Medicare Other | Attending: Family | Admitting: Family

## 2020-11-13 VITALS — BP 147/79 | HR 82 | Resp 20 | Ht 68.0 in | Wt 206.5 lb

## 2020-11-13 DIAGNOSIS — I4891 Unspecified atrial fibrillation: Secondary | ICD-10-CM | POA: Diagnosis not present

## 2020-11-13 DIAGNOSIS — I110 Hypertensive heart disease with heart failure: Secondary | ICD-10-CM | POA: Insufficient documentation

## 2020-11-13 DIAGNOSIS — I509 Heart failure, unspecified: Secondary | ICD-10-CM | POA: Diagnosis not present

## 2020-11-13 DIAGNOSIS — Z7901 Long term (current) use of anticoagulants: Secondary | ICD-10-CM | POA: Diagnosis not present

## 2020-11-13 DIAGNOSIS — I5022 Chronic systolic (congestive) heart failure: Secondary | ICD-10-CM

## 2020-11-13 DIAGNOSIS — I10 Essential (primary) hypertension: Secondary | ICD-10-CM

## 2020-11-13 DIAGNOSIS — J449 Chronic obstructive pulmonary disease, unspecified: Secondary | ICD-10-CM | POA: Diagnosis not present

## 2020-11-13 DIAGNOSIS — Z87891 Personal history of nicotine dependence: Secondary | ICD-10-CM | POA: Diagnosis not present

## 2020-11-13 DIAGNOSIS — I480 Paroxysmal atrial fibrillation: Secondary | ICD-10-CM

## 2020-11-13 MED ORDER — SACUBITRIL-VALSARTAN 24-26 MG PO TABS: 1.0000 | ORAL_TABLET | Freq: Two times a day (BID) | ORAL | 3 refills | Status: DC

## 2020-11-13 NOTE — Progress Notes (Signed)
Patient ID: Karl Norman, male    DOB: March 08, 1946, 75 y.o.   MRN: [id number]  HPI  Karl Norman is a 75 y/o male with a history of atrial fibrillation, HTN, GERD, COPD, previous tobacco use and chronic heart failure.   Echo report from 10/24/20 reviewed and showed an EF of 25-30% along with moderate/ severe Karl/ TR and mild AR.   Admitted 10/21/20 due to shortness of breath. Initially needed bipap due to hypoxia. AF with AVR. Initially given IV lasix with transition to oral diuretics. Cardiology consult obtained. Successfully cardioverted from AF. Kayexalate given for hyperkalemia (6.4). Discharged after 4 days.   He presents today for a follow-up visit with a chief complaint of moderate fatigue upon minimal exertion. He describes this as chronic in nature having been present for several months although he does feel like he's feeling better. He has associated shortness of breath (improving), pedal edema (improving), abdominal distention (improving) and light-headedness along with this. He denies any difficulty sleeping, palpitations, chest pain, cough or weight gain.   Received 80mg  IV lasix 4 times last week and overall feels much better from the first time he was here. Has been riding his stationary bike.   Past Medical History:  Diagnosis Date  . Arrhythmia    atrial fibrillation  . CHF (congestive heart failure) ([place])    had three tubes to drain heart 2011  . COPD (chronic obstructive pulmonary disease) ([place])   . Dyspnea    with exertion  . GERD (gastroesophageal reflux disease)   . Hypertension   . 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 Pancreatitis    Past Surgical History:  Procedure Laterality Date  . CARDIOVERSION N/A 10/24/2020   Procedure: CARDIOVERSION;  Surgeon: Corey Skains, MD;  Location: [facility] ORS;  Service: Cardiovascular;  Laterality: N/A;  . COLONOSCOPY WITH PROPOFOL N/A 08/15/2018   Procedure: COLONOSCOPY WITH PROPOFOL;   Surgeon: Manya Silvas, MD;  Location: [facility] ENDOSCOPY;  Service: Endoscopy;  Laterality: N/A;  . ERCP with stent  07/22/2018  . REPLACEMENT TOTAL KNEE    . TEE WITHOUT CARDIOVERSION N/A 11/11/2015   Procedure: TRANSESOPHAGEAL ECHOCARDIOGRAM (TEE);  Surgeon: Corey Skains, MD;  Location: [facility] ORS;  Service: Cardiovascular;  Laterality: N/A;  . TEE WITHOUT CARDIOVERSION N/A 10/24/2020   Procedure: TRANSESOPHAGEAL ECHOCARDIOGRAM (TEE);  Surgeon: Corey Skains, MD;  Location: [facility] ORS;  Service: Cardiovascular;  Laterality: N/A;   No family history on file. Social History   Tobacco Use  . Smoking status: Former Research scientist (life sciences)  . Smokeless tobacco: Never Used  Substance Use Topics  . Alcohol use: No   Allergies  Allergen Reactions  . Penicillins     Has patient had a PCN reaction causing immediate rash, facial/tongue/throat swelling, SOB or lightheadedness with hypotension: NO Has patient had a PCN reaction causing severe rash involving mucus membranes or skin necrosis: YES Has patient had a PCN reaction that required hospitalization: YES Has patient had a PCN reaction occurring within the last 10 years: NO If all of the above answers are "NO", then may proceed with Cephalosporin use.    Prior to Admission medications   Medication Sig Start Date End Date Taking? Authorizing Provider  apixaban (ELIQUIS) 5 MG TABS tablet Take 1 tablet (5 mg total) by mouth 2 (two) times daily. 10/25/20  Yes Shawna Clamp, MD  b complex vitamins capsule Take 1 capsule by mouth daily.   Yes [provider]  ferrous sulfate 324 MG TBEC Take 324 mg by mouth daily as needed.   Yes [provider]  furosemide (LASIX) 40 MG tablet Take 1 tablet (40 mg total) by mouth daily. 10/26/20  Yes Shawna Clamp, MD  ibuprofen (ADVIL) 200 MG tablet Take 400 mg by mouth every 6 (six) hours as needed for mild pain.   Yes [provider]  metoprolol tartrate (LOPRESSOR) 25 MG tablet Take 1 tablet (25  mg total) by mouth 2 (two) times daily. 10/25/20  Yes Shawna Clamp, MD  omeprazole (PRILOSEC) 20 MG capsule Take 20 mg by mouth daily.   Yes [provider]  simethicone (MYLICON) 80 MG chewable tablet Chew 80 mg by mouth every 6 (six) hours as needed for flatulence.   Yes [provider]  vitamin B-12 (CYANOCOBALAMIN) 1000 MCG tablet Take 1,000 mcg by mouth daily.   Yes [provider]    Review of Systems  Constitutional: Positive for fatigue (easily). Negative for appetite change.  HENT: Negative for congestion, postnasal drip and sore throat.   Eyes: Negative.   Respiratory: Positive for shortness of breath (improving). Negative for cough and chest tightness.   Cardiovascular: Positive for leg swelling (improving). Negative for chest pain and palpitations.  Gastrointestinal: Positive for abdominal distention (improving). Negative for abdominal pain.  Endocrine: Negative.   Genitourinary: Negative.   Musculoskeletal: Positive for back pain.  Skin: Negative.   Allergic/Immunologic: Negative.   Neurological: Positive for light-headedness (with change of position too quickly). Negative for dizziness.  Hematological: Negative for adenopathy. Does not bruise/bleed easily.  Psychiatric/Behavioral: Negative for dysphoric mood and sleep disturbance (sleeping on wedge). The patient is not nervous/anxious.    Vitals:   11/13/20 1237  BP: (!) 147/79  Pulse: 82  Resp: 20  SpO2: 94%  Weight: 206 lb 8 oz (93.7 kg)  Height: 5\' 8"  (1.727 m)   Wt Readings from Last 3 Encounters:  11/13/20 206 lb 8 oz (93.7 kg)  11/05/20 222 lb (100.7 kg)  11/04/20 230 lb 2 oz (104.4 kg)   Lab Results  Component Value Date   CREATININE 1.13 11/07/2020   CREATININE 0.95 11/06/2020   CREATININE 1.06 11/05/2020    Physical Exam Vitals and nursing note reviewed.  Constitutional:      Appearance: He is well-developed.  HENT:     Head: Normocephalic and atraumatic.  Neck:      Vascular: No JVD.  Cardiovascular:     Rate and Rhythm: Normal rate and regular rhythm.  Pulmonary:     Effort: Pulmonary effort is normal. No respiratory distress.     Breath sounds: No wheezing or rales.  Abdominal:     General: There is no distension.     Palpations: Abdomen is soft.     Tenderness: There is no abdominal tenderness.  Musculoskeletal:     Cervical back: Neck supple.     Right lower leg: No tenderness. Edema (2+ pitting) present.     Left lower leg: No tenderness. Edema (2+ pitting) present.     Comments: Legs feel slightly softer  Skin:    General: Skin is warm and dry.  Neurological:     General: No focal deficit present.     Mental Status: He is alert and oriented to person, place, and time.  Psychiatric:        Mood and Affect: Mood normal.        Behavior: Behavior normal.    Assessment & Plan:  1: Chronic heart  failure with reduced ejection fraction- - NYHA class III - euvolemic today - weighing daily; reminded to call for an overnight weight gain of > 2 pounds or a weekly weight gain of > 5 pounds - weight down 16 pounds from last visit here 10 days ago - received 80mg  IV lasix numerous times last week - not adding salt and has been looking at food labels; says that he doesn't eat much processed or canned foods; eats seafood ~ twice/ month and is aware that he'll get more salt intake during those meals - saw cardiology Nehemiah Massed) 11/07/20; returns 12/24/20 - has echo and stress test scheduled on 12/17/20 - used to golf but now gets short of breath easily - will start GDMT by adding entresto 24/26mg  BID; 30 day voucher given to patient - will check BMP at next visit - discussed that at future visits depending on renal function/ potassium, could benefit from [place] or jardiance; consider changing his metoprolol tartrate to either succinate or carvedilol; spironolactone if able - wearing compression socks daily although doesn't have them on today - BNP  11/04/20 was 1346.0  2: HTN- - BP mildly elevated today; adding entresto per above - saw PCP ([place]) 11/06/20; returns 01/17/21 - BMP 11/04/20 reviewed and showed sodium 140, potassium 4.8, creatinine 0.97 and GFR >60  3: AF- - cardioverted during Jan 2022 admission - regular rhythm today   Patient did not bring his medications nor a list. Each medication was verbally reviewed with the patient and he was encouraged to bring the bottles to every visit to confirm accuracy of list.  Return in 2 weeks or sooner for any questions/problems before then.

## 2020-11-13 NOTE — Patient Instructions (Addendum)
Continue weighing daily and call for an overnight weight gain of > 2 pounds or a weekly weight gain of >5 pounds.   Add entresto 24/26mg  as 1 tablet twice a day

## 2020-11-15 ENCOUNTER — Encounter: Payer: Self-pay | Admitting: Family

## 2020-11-27 ENCOUNTER — Ambulatory Visit: Payer: Medicare Other | Admitting: Family

## 2020-11-27 ENCOUNTER — Encounter: Payer: Self-pay | Admitting: Family

## 2020-11-27 ENCOUNTER — Other Ambulatory Visit: Payer: Self-pay

## 2020-11-27 ENCOUNTER — Other Ambulatory Visit
Admission: RE | Admit: 2020-11-27 | Discharge: 2020-11-27 | Disposition: A | Discharge status: Home / Self Care | Payer: Medicare Other | Source: Ambulatory Visit | Attending: Family | Admitting: Family

## 2020-11-27 VITALS — BP 139/72 | HR 62 | Resp 20 | Ht 71.0 in | Wt 193.1 lb

## 2020-11-27 DIAGNOSIS — I10 Essential (primary) hypertension: Secondary | ICD-10-CM

## 2020-11-27 DIAGNOSIS — I5022 Chronic systolic (congestive) heart failure: Secondary | ICD-10-CM

## 2020-11-27 DIAGNOSIS — I480 Paroxysmal atrial fibrillation: Secondary | ICD-10-CM

## 2020-11-27 LAB — BASIC METABOLIC PANEL
Anion gap: 9 (ref 5–15)
BUN: 13 mg/dL (ref 8–23)
CO2: 26 mmol/L (ref 22–32)
Calcium: 8.8 mg/dL — ABNORMAL LOW (ref 8.9–10.3)
Chloride: 99 mmol/L (ref 98–111)
Creatinine, Ser: 0.92 mg/dL (ref 0.61–1.24)
GFR, Estimated: >60 mL/min (ref >60)
Glucose, Bld: 95 mg/dL (ref 70–99)
Potassium: 5.1 mmol/L (ref 3.5–5.1)
Sodium: 134 mmol/L — ABNORMAL LOW (ref 135–145)

## 2020-11-27 NOTE — Progress Notes (Signed)
Patient ID: Karl Norman, male    DOB: 1946/02/19, 75 y.o.   MRN: [id number]  HPI  Mr Wynter is a 75 y/o male with a history of atrial fibrillation, HTN, GERD, COPD, previous tobacco use and chronic heart failure.   Echo report from 10/24/20 reviewed and showed an EF of 25-30% along with moderate/ severe MR/ TR and mild AR.   Admitted 10/21/20 due to shortness of breath. Initially needed bipap due to hypoxia. AF with AVR. Initially given IV lasix with transition to oral diuretics. Cardiology consult obtained. Successfully cardioverted from AF. Kayexalate given for hyperkalemia (6.4). Discharged after 4 days.   He presents today for a follow-up visit with a chief complaint of minimal shortness of breath upon moderate exertion. He describes this as chronic in nature having been present for several months although he does say that his breathing has continued to improve. He has associated fatigue, pedal edema (improving), back pain and light-headedness along with this. He denies any difficulty sleeping, abdominal distention, palpitations, chest pain, cough or weight gain.   Says that he's tolerated the entresto without known side effects. He continues to have some dizziness but doesn't feel like it's any worse. Did go golfing yesterday for the first time since his admission back in January and he did say that his BP was low afterwards.   Says that he really doesn't want to have to take too many more medications.   Past Medical History:  Diagnosis Date  . Arrhythmia    atrial fibrillation  . CHF (congestive heart failure) ([place])    had three tubes to drain heart 2011  . COPD (chronic obstructive pulmonary disease) ([place])   . Dyspnea    with exertion  . GERD (gastroesophageal reflux disease)   . Hypertension   . 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 Pancreatitis    Past Surgical History:  Procedure Laterality Date  . CARDIOVERSION N/A 10/24/2020    Procedure: CARDIOVERSION;  Surgeon: Corey Skains, MD;  Location: [facility] ORS;  Service: Cardiovascular;  Laterality: N/A;  . COLONOSCOPY WITH PROPOFOL N/A 08/15/2018   Procedure: COLONOSCOPY WITH PROPOFOL;  Surgeon: Manya Silvas, MD;  Location: [facility] ENDOSCOPY;  Service: Endoscopy;  Laterality: N/A;  . ERCP with stent  07/22/2018  . REPLACEMENT TOTAL KNEE    . TEE WITHOUT CARDIOVERSION N/A 11/11/2015   Procedure: TRANSESOPHAGEAL ECHOCARDIOGRAM (TEE);  Surgeon: Corey Skains, MD;  Location: [facility] ORS;  Service: Cardiovascular;  Laterality: N/A;  . TEE WITHOUT CARDIOVERSION N/A 10/24/2020   Procedure: TRANSESOPHAGEAL ECHOCARDIOGRAM (TEE);  Surgeon: Corey Skains, MD;  Location: [facility] ORS;  Service: Cardiovascular;  Laterality: N/A;   No family history on file. Social History   Tobacco Use  . Smoking status: Former Research scientist (life sciences)  . Smokeless tobacco: Never Used  Substance Use Topics  . Alcohol use: No   Allergies  Allergen Reactions  . Penicillins     Has patient had a PCN reaction causing immediate rash, facial/tongue/throat swelling, SOB or lightheadedness with hypotension: NO Has patient had a PCN reaction causing severe rash involving mucus membranes or skin necrosis: YES Has patient had a PCN reaction that required hospitalization: YES Has patient had a PCN reaction occurring within the last 10 years: NO If all of the above answers are "NO", then may proceed with Cephalosporin use.    Prior to Admission medications   Medication Sig Start Date End Date Taking? Authorizing Provider  apixaban Arne Cleveland)  5 MG TABS tablet Take 1 tablet (5 mg total) by mouth 2 (two) times daily. 10/25/20  Yes Shawna Clamp, MD  b complex vitamins capsule Take 1 capsule by mouth daily.   Yes [provider]  ferrous sulfate 324 MG TBEC Take 324 mg by mouth daily as needed.   Yes [provider]  furosemide (LASIX) 40 MG tablet Take 1 tablet (40 mg total) by mouth daily. 10/26/20  Yes  Shawna Clamp, MD  ibuprofen (ADVIL) 200 MG tablet Take 400 mg by mouth every 6 (six) hours as needed for mild pain.   Yes [provider]  metoprolol tartrate (LOPRESSOR) 25 MG tablet Take 1 tablet (25 mg total) by mouth 2 (two) times daily. 10/25/20  Yes Shawna Clamp, MD  omeprazole (PRILOSEC) 20 MG capsule Take 20 mg by mouth daily.   Yes [provider]  sacubitril-valsartan (ENTRESTO) 24-26 MG Take 1 tablet by mouth 2 (two) times daily. 11/13/20  Yes Darylene Price A, FNP  simethicone (MYLICON) 80 MG chewable tablet Chew 80 mg by mouth every 6 (six) hours as needed for flatulence.   Yes [provider]  vitamin B-12 (CYANOCOBALAMIN) 1000 MCG tablet Take 1,000 mcg by mouth daily.   Yes [provider]    Review of Systems  Constitutional: Positive for fatigue (improving). Negative for appetite change.  HENT: Negative for congestion, postnasal drip and sore throat.   Eyes: Negative.   Respiratory: Positive for shortness of breath ("getting better"). Negative for cough and chest tightness.   Cardiovascular: Positive for leg swelling ("much better"). Negative for chest pain and palpitations.  Gastrointestinal: Negative for abdominal distention and abdominal pain.  Endocrine: Negative.   Genitourinary: Negative.   Musculoskeletal: Positive for back pain.  Skin: Negative.   Allergic/Immunologic: Negative.   Neurological: Positive for light-headedness (with change of position too quickly) and numbness (intermittent in arms). Negative for dizziness.  Hematological: Negative for adenopathy. Does not bruise/bleed easily.  Psychiatric/Behavioral: Negative for dysphoric mood and sleep disturbance (sleeping on wedge). The patient is not nervous/anxious.    Vitals:   11/27/20 1058  BP: 139/72  Pulse: 62  Resp: 20  SpO2: 95%  Weight: 193 lb 2 oz (87.6 kg)  Height: 5\' 11"  (1.803 m)   Wt Readings from Last 3 Encounters:  11/27/20 193 lb 2 oz (87.6 kg)   11/13/20 206 lb 8 oz (93.7 kg)  11/05/20 222 lb (100.7 kg)   Lab Results  Component Value Date   CREATININE 0.92 11/27/2020   CREATININE 1.13 11/07/2020   CREATININE 0.95 11/06/2020    Physical Exam Vitals and nursing note reviewed.  Constitutional:      Appearance: He is well-developed.  HENT:     Head: Normocephalic and atraumatic.  Neck:     Vascular: No JVD.  Cardiovascular:     Rate and Rhythm: Normal rate and regular rhythm.  Pulmonary:     Effort: Pulmonary effort is normal. No respiratory distress.     Breath sounds: No wheezing or rales.  Abdominal:     General: There is no distension.     Palpations: Abdomen is soft.     Tenderness: There is no abdominal tenderness.  Musculoskeletal:     Cervical back: Neck supple.     Right lower leg: No tenderness. Edema (trace pitting) present.     Left lower leg: No tenderness. Edema (trace pitting) present.  Skin:    General: Skin is warm and dry.  Neurological:  General: No focal deficit present.     Mental Status: He is alert and oriented to person, place, and time.  Psychiatric:        Mood and Affect: Mood normal.        Behavior: Behavior normal.    Assessment & Plan:  1: Chronic heart failure with reduced ejection fraction- - NYHA class II - euvolemic today - weighing daily; reminded to call for an overnight weight gain of > 2 pounds or a weekly weight gain of > 5 pounds - weight down 13 pounds from last visit here 2 weeks ago - not adding salt and has been looking at food labels; says that he doesn't eat much processed or canned foods; eats seafood ~ twice/ month and is aware that he'll get more salt intake during those meals - saw cardiology Nehemiah Massed) 11/07/20; returns 12/24/20 - has echo and stress test scheduled on 12/17/20 - did go golfing yesterday and says that his BP was low afterwards but then it recovered; should BP continue to be low at home, consider decreasing diuretic now that most of his fluid  has been removed - will check BMP today since entresto 24/26mg  started at last visit; will not titrate up today - discussed again other GDMT and patient is hesitant because he really doesn't want to take any more medications than he absolutely has to; will not adjust anything today and wait until after his upcoming echo and stress test - if nothing else, could change his metoprolol from BID to QD version - wearing compression socks daily although doesn't have them on today - BNP 11/07/20 was 1090.7  2: HTN- - BP looks good today - saw PCP ([facility]) 11/06/20; returns 01/17/21 - BMP 11/07/20 reviewed and showed sodium 136, potassium 4.2, creatinine 1.13 and GFR >60  3: AF- - cardioverted during Jan 2022 admission - regular rhythm today   Patient did not bring his medications nor a list. Each medication was verbally reviewed with the patient and he was encouraged to bring the bottles to every visit to confirm accuracy of list.  Return in 2 months or sooner for any questions/problems before then.

## 2020-11-27 NOTE — Patient Instructions (Signed)
Continue weighing daily and call for an overnight weight gain of > 2 pounds or a weekly weight gain of >5 pounds. 

## 2020-12-24 DIAGNOSIS — I5022 Chronic systolic (congestive) heart failure: Secondary | ICD-10-CM | POA: Diagnosis present

## 2021-02-01 NOTE — Progress Notes (Signed)
Patient ID: JAHAN FRIEDLANDER, male    DOB: [birth year], 75 y.o.   MRN: [id number]  HPI  Mr Pote is a 75 y/o male with a history of atrial fibrillation, HTN, GERD, COPD, previous tobacco use and chronic heart failure.   Echo report from 12/17/20 reviewed and showed an EF of 45% along with severe LAE. Echo report from 10/24/20 reviewed and showed an EF of 25-30% along with moderate/ severe MR/ TR and mild AR.   Admitted 10/21/20 due to shortness of breath. Initially needed bipap due to hypoxia. AF with AVR. Initially given IV lasix with transition to oral diuretics. Cardiology consult obtained. Successfully cardioverted from AF. Kayexalate given for hyperkalemia (6.4). Discharged after 4 days.   He presents today for a follow-up visit with a chief complaint of minimal shortness of breath upon moderate exertion. He describes this as chronic in nature having been present for several months. He has associated fatigue, pedal edema, light-headedness and chronic back pain along with this. He denies any difficulty sleeping, abdominal distention, palpitations, chest pain, cough or weight gain.    Has been able to golf and says that he enjoys doing that although he recognizes that he won't be as good as he once was.   Is concerned about the cost of his apixaban as he has hospital bills coming in and wonders if he could switch to generic warfarin.  Past Medical History:  Diagnosis Date  . Arrhythmia    atrial fibrillation  . CHF (congestive heart failure) ([place])    had three tubes to drain heart 2011  . COPD (chronic obstructive pulmonary disease) ([place])   . Dyspnea    with exertion  . GERD (gastroesophageal reflux disease)   . Hypertension   . 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 Pancreatitis    Past Surgical History:  Procedure Laterality Date  . CARDIOVERSION N/A 10/24/2020   Procedure: CARDIOVERSION;  Surgeon: Corey Skains, MD;  Location: [facility] ORS;   Service: Cardiovascular;  Laterality: N/A;  . COLONOSCOPY WITH PROPOFOL N/A 08/15/2018   Procedure: COLONOSCOPY WITH PROPOFOL;  Surgeon: Manya Silvas, MD;  Location: [facility] ENDOSCOPY;  Service: Endoscopy;  Laterality: N/A;  . ERCP with stent  07/22/2018  . REPLACEMENT TOTAL KNEE    . TEE WITHOUT CARDIOVERSION N/A 11/11/2015   Procedure: TRANSESOPHAGEAL ECHOCARDIOGRAM (TEE);  Surgeon: Corey Skains, MD;  Location: [facility] ORS;  Service: Cardiovascular;  Laterality: N/A;  . TEE WITHOUT CARDIOVERSION N/A 10/24/2020   Procedure: TRANSESOPHAGEAL ECHOCARDIOGRAM (TEE);  Surgeon: Corey Skains, MD;  Location: [facility] ORS;  Service: Cardiovascular;  Laterality: N/A;   No family history on file. Social History   Tobacco Use  . Smoking status: Former Research scientist (life sciences)  . Smokeless tobacco: Never Used  Substance Use Topics  . Alcohol use: No   Allergies  Allergen Reactions  . Penicillins     Has patient had a PCN reaction causing immediate rash, facial/tongue/throat swelling, SOB or lightheadedness with hypotension: NO Has patient had a PCN reaction causing severe rash involving mucus membranes or skin necrosis: YES Has patient had a PCN reaction that required hospitalization: YES Has patient had a PCN reaction occurring within the last 10 years: NO If all of the above answers are "NO", then may proceed with Cephalosporin use.    Prior to Admission medications   Medication Sig Start Date End Date Taking? Authorizing Provider  apixaban (ELIQUIS) 5 MG TABS tablet Take 1 tablet (5  mg total) by mouth 2 (two) times daily. 10/25/20  Yes Shawna Clamp, MD  b complex vitamins capsule Take 1 capsule by mouth daily.   Yes [provider]  ferrous sulfate 324 MG TBEC Take 324 mg by mouth daily as needed.   Yes [provider]  furosemide (LASIX) 40 MG tablet Take 1 tablet (40 mg total) by mouth daily. 10/26/20  Yes Shawna Clamp, MD  ibuprofen (ADVIL) 200 MG tablet Take 400 mg by mouth every 6  (six) hours as needed for mild pain.   Yes [provider]  metoprolol tartrate (LOPRESSOR) 25 MG tablet Take 1 tablet (25 mg total) by mouth 2 (two) times daily. 10/25/20  Yes Shawna Clamp, MD  omeprazole (PRILOSEC) 20 MG capsule Take 20 mg by mouth daily.   Yes [provider]  sacubitril-valsartan (ENTRESTO) 24-26 MG Take 1 tablet by mouth 2 (two) times daily. 11/13/20  Yes Darylene Price A, FNP  simethicone (MYLICON) 80 MG chewable tablet Chew 80 mg by mouth every 6 (six) hours as needed for flatulence.   Yes [provider]  vitamin B-12 (CYANOCOBALAMIN) 1000 MCG tablet Take 1,000 mcg by mouth daily.   Yes [provider]    Review of Systems  Constitutional: Positive for fatigue (improving). Negative for appetite change.  HENT: Negative for congestion, postnasal drip and sore throat.   Eyes: Negative.   Respiratory: Positive for shortness of breath (minimal). Negative for cough and chest tightness.   Cardiovascular: Positive for leg swelling ("much better"). Negative for chest pain and palpitations.  Gastrointestinal: Negative for abdominal distention and abdominal pain.  Endocrine: Negative.   Genitourinary: Negative.   Musculoskeletal: Positive for back pain.  Skin: Negative.   Allergic/Immunologic: Negative.   Neurological: Positive for light-headedness (with change of position too quickly) and numbness (intermittent in arms). Negative for dizziness.  Hematological: Negative for adenopathy. Does not bruise/bleed easily.  Psychiatric/Behavioral: Negative for dysphoric mood and sleep disturbance (sleeping on wedge). The patient is not nervous/anxious.    Vitals:   02/03/21 0916  BP: (!) 146/72  Pulse: (!) 45  Resp: 16  SpO2: 93%  Weight: 188 lb 2 oz (85.3 kg)  Height: 5\' 11"  (1.803 m)   Wt Readings from Last 3 Encounters:  02/03/21 188 lb 2 oz (85.3 kg)  11/27/20 193 lb 2 oz (87.6 kg)  11/13/20 206 lb 8 oz (93.7 kg)   Lab Results   Component Value Date   CREATININE 0.92 11/27/2020   CREATININE 1.13 11/07/2020   CREATININE 0.95 11/06/2020    Physical Exam Vitals and nursing note reviewed.  Constitutional:      Appearance: He is well-developed.  HENT:     Head: Normocephalic and atraumatic.  Neck:     Vascular: No JVD.  Cardiovascular:     Rate and Rhythm: Regular rhythm. Bradycardia present.  Pulmonary:     Effort: Pulmonary effort is normal. No respiratory distress.     Breath sounds: No wheezing or rales.  Abdominal:     General: There is no distension.     Palpations: Abdomen is soft.     Tenderness: There is no abdominal tenderness.  Musculoskeletal:     Cervical back: Neck supple.     Right lower leg: No tenderness. Edema (trace pitting) present.     Left lower leg: No tenderness. Edema (trace pitting) present.  Skin:    General: Skin is warm and dry.  Neurological:     General: No focal deficit present.  Mental Status: He is alert and oriented to person, place, and time.  Psychiatric:        Mood and Affect: Mood normal.        Behavior: Behavior normal.    Assessment & Plan:  1: Chronic heart failure with reduced ejection fraction although improved- - NYHA class II - euvolemic today - weighing daily; reminded to call for an overnight weight gain of > 2 pounds or a weekly weight gain of > 5 pounds - weight down 5 pounds from last visit here 2 months ago - not adding salt and has been looking at food labels; says that he doesn't eat much processed or canned foods; eats seafood ~ twice/ month and is aware that he'll get more salt intake during those meals - saw cardiology Nehemiah Massed) 12/24/20  - has been golfing some and is enjoying that - discussed again other GDMT and patient is hesitant because he really doesn't want to take any more medications than he absolutely has to - BNP 11/07/20 was 1090.7  2: HTN- - BP looks good today - saw PCP ([facility]) 01/17/21 - BMP 01/30/21 reviewed and  showed sodium 139, potassium 5.1, creatinine 1.2 and GFR 60; says that he's due to have his lab work repeated in a few weeks  3: AF- - cardioverted during Jan 2022 admission - regular rhythm today - discussed potential risks/ benefits of staying on apixaban versus going on warfarin; after discussion he feels like he should stay on apixaban but I did encourage him to continue this conversation with Dr. Nehemiah Massed; offered to fill out patient assistance forms but he declines at this time saying "someone else needs more help than I do."   Patient did not bring his medications nor a list. Each medication was verbally reviewed with the patient and he was encouraged to bring the bottles to every visit to confirm accuracy of list.  Due to HF stability and his reluctance to start new medications, will not make a return appointment for patient at this time. Advised patient that he could call back for any questions or to make another appointment at any time and patient was comfortable with this plan. Emphasized that he maintain close contact with both Dr. Nehemiah Massed and Dr. Doy Hutching.

## 2021-02-03 ENCOUNTER — Ambulatory Visit: Payer: Medicare Other | Attending: Family | Admitting: Family

## 2021-02-03 ENCOUNTER — Other Ambulatory Visit: Payer: Self-pay

## 2021-02-03 ENCOUNTER — Encounter: Payer: Self-pay | Admitting: Family

## 2021-02-03 VITALS — BP 146/72 | HR 45 | Resp 16 | Ht 71.0 in | Wt 188.1 lb

## 2021-02-03 DIAGNOSIS — I10 Essential (primary) hypertension: Secondary | ICD-10-CM

## 2021-02-03 DIAGNOSIS — I110 Hypertensive heart disease with heart failure: Secondary | ICD-10-CM | POA: Insufficient documentation

## 2021-02-03 DIAGNOSIS — Z79899 Other long term (current) drug therapy: Secondary | ICD-10-CM | POA: Insufficient documentation

## 2021-02-03 DIAGNOSIS — J449 Chronic obstructive pulmonary disease, unspecified: Secondary | ICD-10-CM | POA: Insufficient documentation

## 2021-02-03 DIAGNOSIS — Z87891 Personal history of nicotine dependence: Secondary | ICD-10-CM | POA: Diagnosis not present

## 2021-02-03 DIAGNOSIS — Z7901 Long term (current) use of anticoagulants: Secondary | ICD-10-CM | POA: Diagnosis not present

## 2021-02-03 DIAGNOSIS — I509 Heart failure, unspecified: Secondary | ICD-10-CM | POA: Insufficient documentation

## 2021-02-03 DIAGNOSIS — I480 Paroxysmal atrial fibrillation: Secondary | ICD-10-CM

## 2021-02-03 DIAGNOSIS — I4891 Unspecified atrial fibrillation: Secondary | ICD-10-CM | POA: Insufficient documentation

## 2021-02-03 DIAGNOSIS — I5022 Chronic systolic (congestive) heart failure: Secondary | ICD-10-CM

## 2021-02-03 NOTE — Patient Instructions (Addendum)
Continue weighing daily and call for an overnight weight gain of > 2 pounds or a weekly weight gain of >5 pounds.   Call us in the future for any questions or if you'd like to schedule another appointment 

## 2021-02-06 ENCOUNTER — Other Ambulatory Visit: Payer: Self-pay | Admitting: Family

## 2021-02-06 MED ORDER — SACUBITRIL-VALSARTAN 24-26 MG PO TABS: 1.0000 | ORAL_TABLET | Freq: Two times a day (BID) | ORAL | 3 refills | Status: DC

## 2021-02-06 MED ORDER — APIXABAN 5 MG PO TABS: 5.0000 mg | ORAL_TABLET | Freq: Two times a day (BID) | ORAL | 3 refills | Status: DC

## 2021-07-30 ENCOUNTER — Other Ambulatory Visit: Payer: Self-pay | Admitting: Gastroenterology

## 2021-07-30 DIAGNOSIS — Z8619 Personal history of other infectious and parasitic diseases: Secondary | ICD-10-CM

## 2021-08-01 ENCOUNTER — Ambulatory Visit
Admission: RE | Admit: 2021-08-01 | Discharge: 2021-08-01 | Disposition: A | Discharge status: Home / Self Care | Payer: Medicare Other | Source: Ambulatory Visit | Attending: Gastroenterology | Admitting: Gastroenterology

## 2021-08-01 ENCOUNTER — Other Ambulatory Visit: Payer: Self-pay

## 2021-08-01 DIAGNOSIS — Z8619 Personal history of other infectious and parasitic diseases: Secondary | ICD-10-CM | POA: Diagnosis not present

## 2022-01-19 ENCOUNTER — Encounter: Payer: Self-pay | Admitting: *Deleted

## 2022-01-20 ENCOUNTER — Ambulatory Visit
Admission: RE | Admit: 2022-01-20 | Discharge: 2022-01-20 | Disposition: A | Discharge status: Home / Self Care | Payer: Medicare Other | Attending: Gastroenterology | Admitting: Gastroenterology

## 2022-01-20 ENCOUNTER — Ambulatory Visit: Payer: Medicare Other | Admitting: Registered Nurse

## 2022-01-20 ENCOUNTER — Other Ambulatory Visit: Payer: Self-pay

## 2022-01-20 ENCOUNTER — Encounter: Payer: Self-pay | Admitting: *Deleted

## 2022-01-20 ENCOUNTER — Encounter
Admission: RE | Disposition: A | Discharge status: Home / Self Care | Payer: Self-pay | Source: Home / Self Care | Attending: Gastroenterology

## 2022-01-20 DIAGNOSIS — Z1211 Encounter for screening for malignant neoplasm of colon: Secondary | ICD-10-CM | POA: Diagnosis not present

## 2022-01-20 DIAGNOSIS — I4891 Unspecified atrial fibrillation: Secondary | ICD-10-CM | POA: Insufficient documentation

## 2022-01-20 DIAGNOSIS — K2289 Other specified disease of esophagus: Secondary | ICD-10-CM | POA: Insufficient documentation

## 2022-01-20 DIAGNOSIS — Z86718 Personal history of other venous thrombosis and embolism: Secondary | ICD-10-CM | POA: Diagnosis not present

## 2022-01-20 DIAGNOSIS — B1920 Unspecified viral hepatitis C without hepatic coma: Secondary | ICD-10-CM | POA: Diagnosis not present

## 2022-01-20 DIAGNOSIS — Z87891 Personal history of nicotine dependence: Secondary | ICD-10-CM | POA: Diagnosis not present

## 2022-01-20 DIAGNOSIS — K861 Other chronic pancreatitis: Secondary | ICD-10-CM | POA: Diagnosis not present

## 2022-01-20 DIAGNOSIS — K219 Gastro-esophageal reflux disease without esophagitis: Secondary | ICD-10-CM | POA: Diagnosis present

## 2022-01-20 DIAGNOSIS — Z86010 Personal history of colonic polyps: Secondary | ICD-10-CM | POA: Insufficient documentation

## 2022-01-20 DIAGNOSIS — I110 Hypertensive heart disease with heart failure: Secondary | ICD-10-CM | POA: Insufficient documentation

## 2022-01-20 DIAGNOSIS — I5022 Chronic systolic (congestive) heart failure: Secondary | ICD-10-CM | POA: Diagnosis not present

## 2022-01-20 DIAGNOSIS — J449 Chronic obstructive pulmonary disease, unspecified: Secondary | ICD-10-CM | POA: Diagnosis not present

## 2022-01-20 DIAGNOSIS — K2100 Gastro-esophageal reflux disease with esophagitis, without bleeding: Secondary | ICD-10-CM | POA: Insufficient documentation

## 2022-01-20 DIAGNOSIS — K449 Diaphragmatic hernia without obstruction or gangrene: Secondary | ICD-10-CM | POA: Diagnosis not present

## 2022-01-20 HISTORY — DX: Deep phlebothrombosis in pregnancy, unspecified trimester: O22.30

## 2022-01-20 HISTORY — DX: Other symptoms and signs involving the genitourinary system: R39.89

## 2022-01-20 HISTORY — PX: ESOPHAGOGASTRODUODENOSCOPY (EGD) WITH PROPOFOL: SHX5813

## 2022-01-20 HISTORY — DX: Other chronic pancreatitis: K86.1

## 2022-01-20 HISTORY — DX: Inflammatory liver disease, unspecified: K75.9

## 2022-01-20 HISTORY — PX: COLONOSCOPY WITH PROPOFOL: SHX5780

## 2022-01-20 SURGERY — ESOPHAGOGASTRODUODENOSCOPY (EGD) WITH PROPOFOL
Anesthesia: General

## 2022-01-20 MED ORDER — SODIUM CHLORIDE 0.9 % IV SOLN: INTRAVENOUS | Status: DC

## 2022-01-20 MED ORDER — PROPOFOL 500 MG/50ML IV EMUL
INTRAVENOUS | Status: DC | PRN
  Administered 2022-01-20: 140 ug/kg/min via INTRAVENOUS

## 2022-01-20 MED ORDER — DEXMEDETOMIDINE (PRECEDEX) IN NS 20 MCG/5ML (4 MCG/ML) IV SYRINGE
PREFILLED_SYRINGE | INTRAVENOUS | Status: DC | PRN
  Administered 2022-01-20 (×2): 4 ug via INTRAVENOUS

## 2022-01-20 MED ORDER — GLYCOPYRROLATE 0.2 MG/ML IJ SOLN
INTRAMUSCULAR | Status: DC | PRN
  Administered 2022-01-20: .2 mg via INTRAVENOUS

## 2022-01-20 MED ORDER — EPHEDRINE SULFATE (PRESSORS) 50 MG/ML IJ SOLN
INTRAMUSCULAR | Status: DC | PRN
  Administered 2022-01-20 (×3): 5 mg via INTRAVENOUS

## 2022-01-20 MED ORDER — LIDOCAINE HCL (CARDIAC) PF 100 MG/5ML IV SOSY
PREFILLED_SYRINGE | INTRAVENOUS | Status: DC | PRN
  Administered 2022-01-20: 80 mg via INTRAVENOUS

## 2022-01-20 MED ORDER — PROPOFOL 10 MG/ML IV BOLUS
INTRAVENOUS | Status: DC | PRN
  Administered 2022-01-20: 30 mg via INTRAVENOUS
  Administered 2022-01-20: 60 mg via INTRAVENOUS

## 2022-01-20 NOTE — Anesthesia Postprocedure Evaluation (Signed)
Anesthesia Post Note ? ?Patient: [name] ? ?Procedure(s) Performed: ESOPHAGOGASTRODUODENOSCOPY (EGD) WITH PROPOFOL ?COLONOSCOPY WITH PROPOFOL ? ?Patient location during evaluation: Endoscopy ?Anesthesia Type: General ?Level of consciousness: awake and alert ?Pain management: pain level controlled ?Vital Signs Assessment: post-procedure vital signs reviewed and stable ?Respiratory status: spontaneous breathing, nonlabored ventilation, respiratory function stable and patient connected to nasal cannula oxygen ?Cardiovascular status: blood pressure returned to baseline and stable ?Postop Assessment: no apparent nausea or vomiting ?Anesthetic complications: no ? ? ?No notable events documented. ? ? ?Last Vitals:  ?Vitals:  ? 01/20/22 0920 01/20/22 0930  ?BP: (!) 146/77 (!) 146/79  ?Pulse: 77 80  ?Resp: 18 (!) 26  ?Temp:    ?SpO2: 96% 95%  ?  ?Last Pain:  ?Vitals:  ? 01/20/22 0850  ?TempSrc: Temporal  ?PainSc:   ? ? ?  ?  ?  ?  ?  ?  ? ?Precious Haws Ramanda Paules ? ? ? ? ?

## 2022-01-20 NOTE — Interval H&P Note (Signed)
History and Physical Interval Note: ? ?01/20/2022 ?8:27 AM ? ?Karl Norman  has presented today for surgery, with the diagnosis of GERD ?PH Colon Polyps.  The various methods of treatment have been discussed with the patient and family. After consideration of risks, benefits and other options for treatment, the patient has consented to  Procedure(s): ?ESOPHAGOGASTRODUODENOSCOPY (EGD) WITH PROPOFOL (N/A) ?COLONOSCOPY WITH PROPOFOL (N/A) as a surgical intervention.  The patient's history has been reviewed, patient examined, no change in status, stable for surgery.  I have reviewed the patient's chart and labs.  Questions were answered to the patient's satisfaction.   ? ? ?Hilton Cork Meribeth Vitug ? ?Ok to proceed with EGD/Colonoscopy ?

## 2022-01-20 NOTE — Anesthesia Preprocedure Evaluation (Signed)
Anesthesia Evaluation  ?Patient identified by MRN, date of birth, ID band ?Patient awake ? ? ? ?Reviewed: ?Allergy & Precautions, NPO status , Patient's Chart, lab work & pertinent test results ? ?History of Anesthesia Complications ?Negative for: history of anesthetic complications ? ?Airway ?Mallampati: III ? ?TM Distance: >3 FB ?Neck ROM: full ? ? ? Dental ? ?(+) Chipped, Poor Dentition, Missing, Loose ?  ?Pulmonary ?shortness of breath and with exertion, COPD, former smoker,  ?  ?Pulmonary exam normal ? ? ? ? ? ? ? Cardiovascular ?Exercise Tolerance: Good ?hypertension, (-) angina+CHF  ?Normal cardiovascular exam ? ? ?  ?Neuro/Psych ?negative neurological ROS ? negative psych ROS  ? GI/Hepatic ?GERD  Controlled,(+) Hepatitis -  ?Endo/Other  ?negative endocrine ROS ? Renal/GU ?negative Renal ROS  ?negative genitourinary ?  ?Musculoskeletal ? ? Abdominal ?  ?Peds ? Hematology ?negative hematology ROS ?(+)   ?Anesthesia Other Findings ?Past Medical History: ?No date: Arrhythmia ?    Comment:  atrial fibrillation ?No date: CHF (congestive heart failure) ([place]) ?    Comment:  had three tubes to drain heart 2011 ?No date: Chronic pancreatitis ([place]) ?No date: COPD (chronic obstructive pulmonary disease) ([place]) ?No date: DVT (deep vein thrombosis) in pregnancy ?No date: Dyspnea ?    Comment:  with exertion ?No date: GERD (gastroesophageal reflux disease) ?No date: Hepatitis ?No date: Hypertension ?2017: Infection of intervertebral disc (pyogenic) of multiple sites  ?in spine [facility]) ?No date: Pancreatitis ?No date: Pneumatouria ? ?Past Surgical History: ?10/24/2020: CARDIOVERSION; N/A ?    Comment:  Procedure: CARDIOVERSION;  Surgeon: Corey Skains,  ?             MD;  Location: [facility] ORS;  Service: Cardiovascular;   ?             Laterality: N/A; ?08/15/2018: COLONOSCOPY WITH PROPOFOL; N/A ?    Comment:  Procedure: COLONOSCOPY WITH PROPOFOL;  Surgeon: Vira Agar, ?             Gavin Pound,  MD;  Location: [facility] ENDOSCOPY;  Service:  ?             Endoscopy;  Laterality: N/A; ?07/22/2018: ERCP with stent ?No date: ESOPHAGOGASTRODUODENOSCOPY ?    Comment:  6 times ?No date: REPLACEMENT TOTAL KNEE ?11/11/2015: TEE WITHOUT CARDIOVERSION; N/A ?    Comment:  Procedure: TRANSESOPHAGEAL ECHOCARDIOGRAM (TEE);   ?             Surgeon: Corey Skains, MD;  Location: [facility] ORS;   ?             Service: Cardiovascular;  Laterality: N/A; ?10/24/2020: TEE WITHOUT CARDIOVERSION; N/A ?    Comment:  Procedure: TRANSESOPHAGEAL ECHOCARDIOGRAM (TEE);   ?             Surgeon: Corey Skains, MD;  Location: [facility] ORS;   ?             Service: Cardiovascular;  Laterality: N/A; ? ? ? ? Reproductive/Obstetrics ?negative OB ROS ? ?  ? ? ? ? ? ? ? ? ? ? ? ? ? ?  ?  ? ? ? ? ? ? ? ? ?Anesthesia Physical ?Anesthesia Plan ? ?ASA: 3 ? ?Anesthesia Plan: General  ? ?Post-op Pain Management:   ? ?Induction: Intravenous ? ?PONV Risk Score and Plan: Propofol infusion and TIVA ? ?Airway Management Planned: Natural Airway and Nasal Cannula ? ?Additional Equipment:  ? ?Intra-op Plan:  ? ?Post-operative Plan:  ? ?Informed Consent:  I have reviewed the patients History and Physical, chart, labs and discussed the procedure including the risks, benefits and alternatives for the proposed anesthesia with the patient or authorized representative who has indicated his/her understanding and acceptance.  ? ? ? ?Dental Advisory Given ? ?Plan Discussed with: Anesthesiologist, CRNA and Surgeon ? ?Anesthesia Plan Comments: (Patient consented for risks of anesthesia including but not limited to:  ?- adverse reactions to medications ?- risk of airway placement if required ?- damage to eyes, teeth, lips or other oral mucosa ?- nerve damage due to positioning  ?- sore throat or hoarseness ?- Damage to heart, brain, nerves, lungs, other parts of body or loss of life ? ?Patient voiced understanding.)  ? ? ? ? ? ? ?Anesthesia Quick Evaluation ? ?

## 2022-01-20 NOTE — Op Note (Signed)
[facility] ?Gastroenterology ?Patient Name: Karl Norman ?Procedure Date: 01/20/2022 8:23 AM ?MRN: [id number] ?Account #: [id number] ?Date of Birth: 02-13-1946 ?Admit Type: Outpatient ?Age: 76 ?Room: [facility] ENDO ROOM 1 ?Gender: Male ?Note Status: Finalized ?Instrument Name: Colonoscope [id number] ?Procedure:             Colonoscopy ?Indications:           Surveillance: Personal history of adenomatous polyps  ?                       on last colonoscopy > 3 years ago ?Providers:             Andrey Farmer MD, MD ?Referring MD:          Leonie Douglas. Doy Hutching, MD (Referring MD) ?Medicines:             Monitored Anesthesia Care ?Complications:         No immediate complications. ?Procedure:             Pre-Anesthesia Assessment: ?                       - Prior to the procedure, a History and Physical was  ?                       performed, and patient medications and allergies were  ?                       reviewed. The patient is competent. The risks and  ?                       benefits of the procedure and the sedation options and  ?                       risks were discussed with the patient. All questions  ?                       were answered and informed consent was obtained.  ?                       Patient identification and proposed procedure were  ?                       verified by the physician, the nurse, the  ?                       anesthesiologist, the anesthetist and the technician  ?                       in the endoscopy suite. Mental Status Examination:  ?                       alert and oriented. Airway Examination: normal  ?                       oropharyngeal airway and neck mobility. Respiratory  ?                       Examination: clear to auscultation. CV Examination:  ?  normal. Prophylactic Antibiotics: The patient does not  ?                       require prophylactic antibiotics. Prior  ?                       Anticoagulants: The patient has taken Eliquis  ?                        (apixaban), last dose was 4 days prior to procedure.  ?                       ASA Grade Assessment: III - A patient with severe  ?                       systemic disease. After reviewing the risks and  ?                       benefits, the patient was deemed in satisfactory  ?                       condition to undergo the procedure. The anesthesia  ?                       plan was to use monitored anesthesia care (MAC).  ?                       Immediately prior to administration of medications,  ?                       the patient was re-assessed for adequacy to receive  ?                       sedatives. The heart rate, respiratory rate, oxygen  ?                       saturations, blood pressure, adequacy of pulmonary  ?                       ventilation, and response to care were monitored  ?                       throughout the procedure. The physical status of the  ?                       patient was re-assessed after the procedure. ?                       After obtaining informed consent, the colonoscope was  ?                       passed under direct vision. Throughout the procedure,  ?                       the patient's blood pressure, pulse, and oxygen  ?                       saturations were monitored continuously. The  ?  Colonoscope was introduced through the anus and  ?                       advanced to the the cecum, identified by appendiceal  ?                       orifice and ileocecal valve. The colonoscopy was  ?                       performed without difficulty. The patient tolerated  ?                       the procedure well. The quality of the bowel  ?                       preparation was fair. ?Findings: ?     The perianal and digital rectal examinations were normal. ?     The entire examined colon appeared normal on direct and retroflexion  ?     views. ?Impression:            - Preparation of the colon was fair. ?                       - The  entire examined colon is normal on direct and  ?                       retroflexion views. ?                       - No specimens collected. ?Recommendation:        - Discharge patient to home. ?                       - Resume previous diet. ?                       - Resume Eliquis (apixaban) at prior dose today. ?                       - Repeat colonoscopy is not recommended due to current  ?                       age (35 years or older) for surveillance. ?                       - Return to referring physician as previously  ?                       scheduled. ?Procedure Code(s):     --- Professional --- ?                       G0105, Colorectal cancer screening; colonoscopy on  ?                       individual at high risk ?Diagnosis Code(s):     --- Professional --- ?                       Z86.010, Personal history of colonic polyps ?CPT copyright 2019 American [employer] Association.  All rights reserved. ?The codes documented in this report are preliminary and upon coder review may  ?be revised to meet current compliance requirements. ?Andrey Farmer MD, MD ?01/20/2022 9:01:36 AM ?Number of Addenda: 0 ?Note Initiated On: 01/20/2022 8:23 AM ?Scope Withdrawal Time: 0 hours 7 minutes 38 seconds  ?Total Procedure Duration: 0 hours 12 minutes 14 seconds  ?Estimated Blood Loss:  Estimated blood loss: none. ?     [facility] ?

## 2022-01-20 NOTE — Transfer of Care (Signed)
Immediate Anesthesia Transfer of Care Note ? ?Patient: Karl Norman ? ?Procedure(s) Performed: ESOPHAGOGASTRODUODENOSCOPY (EGD) WITH PROPOFOL ?COLONOSCOPY WITH PROPOFOL ? ?Patient Location: PACU ? ?Anesthesia Type:General ? ?Level of Consciousness: awake, alert  and oriented ? ?Airway & Oxygen Therapy: Patient Spontanous Breathing ? ?Post-op Assessment: Report given to RN and Post -op Vital signs reviewed and stable ? ?Post vital signs: Reviewed and stable ? ?Last Vitals:  ?Vitals Value Taken Time  ?BP 90/48 01/20/22 0857  ?Temp    ?Pulse 80 01/20/22 0857  ?Resp 30 01/20/22 0857  ?SpO2 96 % 01/20/22 0857  ?Vitals shown include unvalidated device data. ? ?Last Pain:  ?Vitals:  ? 01/20/22 0806  ?TempSrc: Temporal  ?PainSc: 0-No pain  ?   ? ?  ? ?Complications: No notable events documented. ?

## 2022-01-20 NOTE — H&P (Signed)
Outpatient short stay form Pre-procedure ?01/20/2022  ?Karl Rubenstein, MD ? ?Primary Physician: Idelle Crouch, MD ? ?Reason for visit:  GERD/Surveillance colonoscopy ? ?History of present illness:   ? ?76 y/o gentleman with history of a. Fib on DOAC with last dose 4 days ago, HFrEF, Hepatitis C, and moderate to severe mitral regurgitation here for EGD/Colonoscopy for GERD and history of 3 Ta's on last colonoscopy done in 2019. ? ? ? ?Current Facility-Administered Medications:  ?  0.9 %  sodium chloride infusion, , Intravenous, Continuous, Akshat Minehart, Hilton Cork, MD, Last Rate: 20 mL/hr at 01/20/22 0816, New Bag at 01/20/22 0816 ? ?Medications Prior to Admission  ?Medication Sig Dispense Refill Last Dose  ? metoprolol tartrate (LOPRESSOR) 25 MG tablet Take 1 tablet (25 mg total) by mouth 2 (two) times daily. 60 tablet 1 01/20/2022  ? sacubitril-valsartan (ENTRESTO) 24-26 MG Take 1 tablet by mouth 2 (two) times daily. 60 tablet 3 01/20/2022  ? apixaban (ELIQUIS) 5 MG TABS tablet Take 1 tablet (5 mg total) by mouth 2 (two) times daily. 60 tablet 3 01/16/2022  ? b complex vitamins capsule Take 1 capsule by mouth daily.     ? ferrous sulfate 324 MG TBEC Take 324 mg by mouth daily as needed.     ? furosemide (LASIX) 40 MG tablet Take 1 tablet (40 mg total) by mouth daily. 30 tablet 1   ? ibuprofen (ADVIL) 200 MG tablet Take 400 mg by mouth every 6 (six) hours as needed for mild pain.     ? omeprazole (PRILOSEC) 20 MG capsule Take 20 mg by mouth daily.     ? simethicone (MYLICON) 80 MG chewable tablet Chew 80 mg by mouth every 6 (six) hours as needed for flatulence.     ? vitamin B-12 (CYANOCOBALAMIN) 1000 MCG tablet Take 1,000 mcg by mouth daily.     ? ? ? ?Allergies  ?Allergen Reactions  ? Penicillins   ?  Has patient had a PCN reaction causing immediate rash, facial/tongue/throat swelling, SOB or lightheadedness with hypotension: NO ?Has patient had a PCN reaction causing severe rash involving mucus membranes or skin  necrosis: YES ?Has patient had a PCN reaction that required hospitalization: YES ?Has patient had a PCN reaction occurring within the last 10 years: NO ?If all of the above answers are "NO", then may proceed with Cephalosporin use. ?  ? ? ? ?Past Medical History:  ?Diagnosis Date  ? Arrhythmia   ? atrial fibrillation  ? CHF (congestive heart failure) ([place])   ? had three tubes to drain heart 2011  ? Chronic pancreatitis ([place])   ? COPD (chronic obstructive pulmonary disease) ([place])   ? DVT (deep vein thrombosis) in pregnancy   ? Dyspnea   ? with exertion  ? GERD (gastroesophageal reflux disease)   ? Hepatitis   ? Hypertension   ? 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 Pancreatitis   ? Pneumatouria   ? ? ?Review of systems:  Otherwise negative.  ? ? ?Physical Exam ? ?Gen: Alert, oriented. Appears stated age.  ?HEENT: PERRLA. ?Lungs: No respiratory distress ?CV: RRR ?Abd: soft, benign, no masses ?Ext: No edema ? ? ? ?Planned procedures: Proceed with EGD/colonoscopy. The patient understands the nature of the planned procedure, indications, risks, alternatives and potential complications including but not limited to bleeding, infection, perforation, damage to internal organs and possible oversedation/side effects from anesthesia. The patient agrees and gives consent to proceed.  ?  Please refer to procedure notes for findings, recommendations and patient disposition/instructions.  ? ? ? ?Karl Rubenstein, MD ?[facility] ? ? ? ?  ? ?

## 2022-01-20 NOTE — Op Note (Signed)
[facility] ?Gastroenterology ?Patient Name: Karl Norman ?Procedure Date: 01/20/2022 8:25 AM ?MRN: [id number] ?Account #: [id number] ?Date of Birth: 1946-01-28 ?Admit Type: Outpatient ?Age: 76 ?Room: [facility] ENDO ROOM 1 ?Gender: Male ?Note Status: Finalized ?Instrument Name: Upper Endoscope [id number] ?Procedure:             Upper GI endoscopy ?Indications:           Gastro-esophageal reflux disease ?Providers:             Andrey Farmer MD, MD ?Referring MD:          Leonie Douglas. Doy Hutching, MD (Referring MD) ?Medicines:             Monitored Anesthesia Care ?Complications:         No immediate complications. Estimated blood loss:  ?                       Minimal. ?Procedure:             Pre-Anesthesia Assessment: ?                       - Prior to the procedure, a History and Physical was  ?                       performed, and patient medications and allergies were  ?                       reviewed. The patient is competent. The risks and  ?                       benefits of the procedure and the sedation options and  ?                       risks were discussed with the patient. All questions  ?                       were answered and informed consent was obtained.  ?                       Patient identification and proposed procedure were  ?                       verified by the physician, the nurse, the  ?                       anesthesiologist, the anesthetist and the technician  ?                       in the endoscopy suite. Mental Status Examination:  ?                       alert and oriented. Airway Examination: normal  ?                       oropharyngeal airway and neck mobility. Respiratory  ?                       Examination: clear to auscultation. CV Examination:  ?  normal. Prophylactic Antibiotics: The patient does not  ?                       require prophylactic antibiotics. Prior  ?                       Anticoagulants: The patient has taken Eliquis  ?                        (apixaban), last dose was 4 days prior to procedure.  ?                       ASA Grade Assessment: III - A patient with severe  ?                       systemic disease. After reviewing the risks and  ?                       benefits, the patient was deemed in satisfactory  ?                       condition to undergo the procedure. The anesthesia  ?                       plan was to use monitored anesthesia care (MAC).  ?                       Immediately prior to administration of medications,  ?                       the patient was re-assessed for adequacy to receive  ?                       sedatives. The heart rate, respiratory rate, oxygen  ?                       saturations, blood pressure, adequacy of pulmonary  ?                       ventilation, and response to care were monitored  ?                       throughout the procedure. The physical status of the  ?                       patient was re-assessed after the procedure. ?                       After obtaining informed consent, the endoscope was  ?                       passed under direct vision. Throughout the procedure,  ?                       the patient's blood pressure, pulse, and oxygen  ?                       saturations were monitored continuously. The Endoscope  ?  was introduced through the mouth, and advanced to the  ?                       second part of duodenum. The upper GI endoscopy was  ?                       accomplished without difficulty. The patient tolerated  ?                       the procedure well. ?Findings: ?     One tongue of salmon-colored mucosa was present. No other visible  ?     abnormalities were present. The maximum longitudinal extent of these  ?     esophageal mucosal changes was 1 cm in length. Biopsies were taken with  ?     a cold forceps for histology. Estimated blood loss was minimal. ?     A small hiatal hernia was present. ?     The entire examined stomach was normal. ?      The examined duodenum was normal. ?Impression:            - Salmon-colored mucosa. Biopsied. ?                       - Small hiatal hernia. ?                       - Normal stomach. ?                       - Normal examined duodenum. ?Recommendation:        - Await pathology results. ?                       - Perform a colonoscopy today. ?Procedure Code(s):     --- Professional --- ?                       [phone], Esophagogastroduodenoscopy, flexible,  ?                       transoral; with biopsy, single or multiple ?Diagnosis Code(s):     --- Professional --- ?                       K22.8, Other specified diseases of esophagus ?                       K44.9, Diaphragmatic hernia without obstruction or  ?                       gangrene ?                       K21.9, Gastro-esophageal reflux disease without  ?                       esophagitis ?CPT copyright 2019 American Medical Association. All rights reserved. ?The codes documented in this report are preliminary and upon coder review may  ?be revised to meet current compliance requirements. ?Andrey Farmer MD, MD ?01/20/2022 8:57:51 AM ?Number of Addenda: 0 ?Note Initiated On: 01/20/2022 8:25 AM ?Estimated Blood Loss:  Estimated blood loss was minimal. ?  [facility] ?

## 2022-01-21 ENCOUNTER — Encounter: Payer: Self-pay | Admitting: Gastroenterology

## 2022-01-21 LAB — SURGICAL PATHOLOGY

## 2022-09-21 DIAGNOSIS — J449 Chronic obstructive pulmonary disease, unspecified: Secondary | ICD-10-CM | POA: Insufficient documentation

## 2022-11-04 ENCOUNTER — Other Ambulatory Visit: Payer: Self-pay | Admitting: Gastroenterology

## 2022-11-04 DIAGNOSIS — R1030 Lower abdominal pain, unspecified: Secondary | ICD-10-CM

## 2022-11-05 ENCOUNTER — Ambulatory Visit
Admission: RE | Admit: 2022-11-05 | Discharge: 2022-11-05 | Disposition: A | Discharge status: Home / Self Care | Payer: Medicare Other | Source: Ambulatory Visit | Attending: Gastroenterology | Admitting: Gastroenterology

## 2022-11-05 DIAGNOSIS — R1030 Lower abdominal pain, unspecified: Secondary | ICD-10-CM

## 2022-11-05 MED ORDER — IOHEXOL 9 MG/ML PO SOLN
500.0000 mL | ORAL | Status: AC
  Administered 2022-11-05 (×2): 500 mL via ORAL

## 2022-11-05 MED ORDER — IOHEXOL 300 MG/ML  SOLN
100.0000 mL | Freq: Once | INTRAMUSCULAR | Status: AC | PRN
  Administered 2022-11-05: 100 mL via INTRAVENOUS

## 2022-11-06 ENCOUNTER — Ambulatory Visit: Payer: Medicare Other

## 2022-11-12 ENCOUNTER — Other Ambulatory Visit: Payer: Self-pay | Admitting: Gastroenterology

## 2022-11-12 DIAGNOSIS — K8689 Other specified diseases of pancreas: Secondary | ICD-10-CM

## 2022-11-19 ENCOUNTER — Ambulatory Visit
Admission: RE | Admit: 2022-11-19 | Discharge: 2022-11-19 | Disposition: A | Discharge status: Home / Self Care | Payer: Medicare Other | Source: Ambulatory Visit | Attending: Gastroenterology | Admitting: Gastroenterology

## 2022-11-19 DIAGNOSIS — K8689 Other specified diseases of pancreas: Secondary | ICD-10-CM

## 2022-11-19 MED ORDER — GADOBUTROL 1 MMOL/ML IV SOLN
8.0000 mL | Freq: Once | INTRAVENOUS | Status: AC | PRN
  Administered 2022-11-19: 8 mL via INTRAVENOUS

## 2023-04-29 ENCOUNTER — Other Ambulatory Visit: Payer: Self-pay | Admitting: Surgery

## 2023-04-29 DIAGNOSIS — Z9049 Acquired absence of other specified parts of digestive tract: Secondary | ICD-10-CM

## 2023-05-05 ENCOUNTER — Ambulatory Visit
Admission: RE | Admit: 2023-05-05 | Discharge: 2023-05-05 | Disposition: A | Discharge status: Home / Self Care | Payer: Medicare Other | Source: Ambulatory Visit | Attending: Surgery | Admitting: Surgery

## 2023-05-05 DIAGNOSIS — Z9049 Acquired absence of other specified parts of digestive tract: Secondary | ICD-10-CM | POA: Insufficient documentation

## 2023-05-05 MED ORDER — IOHEXOL 300 MG/ML  SOLN
100.0000 mL | Freq: Once | INTRAMUSCULAR | Status: AC | PRN
  Administered 2023-05-05: 100 mL via INTRAVENOUS

## 2023-09-17 ENCOUNTER — Other Ambulatory Visit: Payer: Self-pay

## 2023-09-17 ENCOUNTER — Emergency Department: Payer: Medicare Other

## 2023-09-17 ENCOUNTER — Inpatient Hospital Stay
Admission: EM | Admit: 2023-09-17 | Discharge: 2023-09-19 | DRG: 862 | Disposition: A | Discharge status: Home Health | Payer: Medicare Other | Attending: Osteopathic Medicine | Admitting: Osteopathic Medicine

## 2023-09-17 DIAGNOSIS — R0600 Dyspnea, unspecified: Secondary | ICD-10-CM | POA: Diagnosis not present

## 2023-09-17 DIAGNOSIS — B182 Chronic viral hepatitis C: Secondary | ICD-10-CM | POA: Diagnosis present

## 2023-09-17 DIAGNOSIS — R54 Age-related physical debility: Secondary | ICD-10-CM | POA: Diagnosis present

## 2023-09-17 DIAGNOSIS — I4510 Unspecified right bundle-branch block: Secondary | ICD-10-CM | POA: Diagnosis present

## 2023-09-17 DIAGNOSIS — K219 Gastro-esophageal reflux disease without esophagitis: Secondary | ICD-10-CM | POA: Diagnosis present

## 2023-09-17 DIAGNOSIS — T8149XD Infection following a procedure, other surgical site, subsequent encounter: Secondary | ICD-10-CM

## 2023-09-17 DIAGNOSIS — K632 Fistula of intestine: Secondary | ICD-10-CM | POA: Diagnosis present

## 2023-09-17 DIAGNOSIS — I959 Hypotension, unspecified: Secondary | ICD-10-CM

## 2023-09-17 DIAGNOSIS — E872 Acidosis, unspecified: Secondary | ICD-10-CM | POA: Diagnosis present

## 2023-09-17 DIAGNOSIS — Z6824 Body mass index (BMI) 24.0-24.9, adult: Secondary | ICD-10-CM

## 2023-09-17 DIAGNOSIS — N179 Acute kidney failure, unspecified: Secondary | ICD-10-CM | POA: Diagnosis present

## 2023-09-17 DIAGNOSIS — R531 Weakness: Principal | ICD-10-CM

## 2023-09-17 DIAGNOSIS — I5023 Acute on chronic systolic (congestive) heart failure: Secondary | ICD-10-CM | POA: Diagnosis present

## 2023-09-17 DIAGNOSIS — Z880 Allergy status to penicillin: Secondary | ICD-10-CM | POA: Diagnosis not present

## 2023-09-17 DIAGNOSIS — Z7901 Long term (current) use of anticoagulants: Secondary | ICD-10-CM | POA: Diagnosis not present

## 2023-09-17 DIAGNOSIS — J449 Chronic obstructive pulmonary disease, unspecified: Secondary | ICD-10-CM | POA: Diagnosis present

## 2023-09-17 DIAGNOSIS — R64 Cachexia: Secondary | ICD-10-CM | POA: Diagnosis present

## 2023-09-17 DIAGNOSIS — Z8249 Family history of ischemic heart disease and other diseases of the circulatory system: Secondary | ICD-10-CM

## 2023-09-17 DIAGNOSIS — I110 Hypertensive heart disease with heart failure: Secondary | ICD-10-CM | POA: Diagnosis present

## 2023-09-17 DIAGNOSIS — E860 Dehydration: Secondary | ICD-10-CM | POA: Diagnosis present

## 2023-09-17 DIAGNOSIS — L03311 Cellulitis of abdominal wall: Secondary | ICD-10-CM | POA: Diagnosis present

## 2023-09-17 DIAGNOSIS — D72829 Elevated white blood cell count, unspecified: Secondary | ICD-10-CM | POA: Insufficient documentation

## 2023-09-17 DIAGNOSIS — Z79899 Other long term (current) drug therapy: Secondary | ICD-10-CM

## 2023-09-17 DIAGNOSIS — I480 Paroxysmal atrial fibrillation: Secondary | ICD-10-CM | POA: Insufficient documentation

## 2023-09-17 DIAGNOSIS — T8143XA Infection following a procedure, organ and space surgical site, initial encounter: Principal | ICD-10-CM | POA: Diagnosis present

## 2023-09-17 DIAGNOSIS — Z806 Family history of leukemia: Secondary | ICD-10-CM | POA: Diagnosis not present

## 2023-09-17 DIAGNOSIS — I509 Heart failure, unspecified: Secondary | ICD-10-CM

## 2023-09-17 DIAGNOSIS — Z87891 Personal history of nicotine dependence: Secondary | ICD-10-CM

## 2023-09-17 DIAGNOSIS — K651 Peritoneal abscess: Secondary | ICD-10-CM | POA: Diagnosis present

## 2023-09-17 DIAGNOSIS — A419 Sepsis, unspecified organism: Secondary | ICD-10-CM

## 2023-09-17 DIAGNOSIS — Z9049 Acquired absence of other specified parts of digestive tract: Secondary | ICD-10-CM

## 2023-09-17 DIAGNOSIS — R6520 Severe sepsis without septic shock: Secondary | ICD-10-CM | POA: Diagnosis not present

## 2023-09-17 DIAGNOSIS — K861 Other chronic pancreatitis: Secondary | ICD-10-CM | POA: Diagnosis present

## 2023-09-17 DIAGNOSIS — M7989 Other specified soft tissue disorders: Secondary | ICD-10-CM | POA: Diagnosis present

## 2023-09-17 DIAGNOSIS — L02211 Cutaneous abscess of abdominal wall: Secondary | ICD-10-CM | POA: Diagnosis not present

## 2023-09-17 DIAGNOSIS — L0390 Cellulitis, unspecified: Secondary | ICD-10-CM | POA: Diagnosis present

## 2023-09-17 LAB — URINALYSIS, W/ REFLEX TO CULTURE (INFECTION SUSPECTED)
Bacteria, UA: NONE SEEN
Bilirubin Urine: NEGATIVE
Glucose, UA: NEGATIVE mg/dL
Hgb urine dipstick: NEGATIVE
Ketones, ur: NEGATIVE mg/dL
Leukocytes,Ua: NEGATIVE
Nitrite: NEGATIVE
Protein, ur: NEGATIVE mg/dL
RBC / HPF: 0 RBC/hpf (ref 0–5)
Specific Gravity, Urine: 1.005 (ref 1.005–1.030)
Squamous Epithelial / HPF: 0 /[HPF] (ref 0–5)
pH: 6 (ref 5.0–8.0)

## 2023-09-17 LAB — CBC
HCT: 39.2 % (ref 39.0–52.0)
Hemoglobin: 12.5 g/dL — ABNORMAL LOW (ref 13.0–17.0)
MCH: 28.5 pg (ref 26.0–34.0)
MCHC: 31.9 g/dL (ref 30.0–36.0)
MCV: 89.3 fL (ref 80.0–100.0)
Platelets: 319 10*3/uL (ref 150–400)
RBC: 4.39 MIL/uL (ref 4.22–5.81)
RDW: 16.4 % — ABNORMAL HIGH (ref 11.5–15.5)
WBC: 12.2 10*3/uL — ABNORMAL HIGH (ref 4.0–10.5)
nRBC: 0 % (ref 0.0–0.2)

## 2023-09-17 LAB — PROTIME-INR
INR: 1.3 — ABNORMAL HIGH (ref 0.8–1.2)
Prothrombin Time: 16.7 s — ABNORMAL HIGH (ref 11.4–15.2)

## 2023-09-17 LAB — COMPREHENSIVE METABOLIC PANEL
ALT: 13 U/L (ref 0–44)
AST: 30 U/L (ref 15–41)
Albumin: 1.9 g/dL — ABNORMAL LOW (ref 3.5–5.0)
Alkaline Phosphatase: 101 U/L (ref 38–126)
Anion gap: 15 (ref 5–15)
BUN: 21 mg/dL (ref 8–23)
CO2: 22 mmol/L (ref 22–32)
Calcium: 7.8 mg/dL — ABNORMAL LOW (ref 8.9–10.3)
Chloride: 97 mmol/L — ABNORMAL LOW (ref 98–111)
Creatinine, Ser: 1.54 mg/dL — ABNORMAL HIGH (ref 0.61–1.24)
GFR, Estimated: 47 mL/min — ABNORMAL LOW (ref >60)
Glucose, Bld: 86 mg/dL (ref 70–99)
Potassium: 3.1 mmol/L — ABNORMAL LOW (ref 3.5–5.1)
Sodium: 134 mmol/L — ABNORMAL LOW (ref 135–145)
Total Bilirubin: 0.6 mg/dL (ref <1.2)
Total Protein: 5.9 g/dL — ABNORMAL LOW (ref 6.5–8.1)

## 2023-09-17 LAB — BRAIN NATRIURETIC PEPTIDE: B Natriuretic Peptide: 178.9 pg/mL — ABNORMAL HIGH (ref 0.0–100.0)

## 2023-09-17 LAB — LACTIC ACID, PLASMA
Lactic Acid, Venous: 6.8 mmol/L (ref 0.5–1.9)
Lactic Acid, Venous: 2.3 mmol/L (ref 0.5–1.9)

## 2023-09-17 LAB — TROPONIN I (HIGH SENSITIVITY)
Troponin I (High Sensitivity): 28 ng/L — ABNORMAL HIGH (ref <18)
Troponin I (High Sensitivity): 23 ng/L — ABNORMAL HIGH (ref <18)

## 2023-09-17 LAB — APTT: aPTT: 31 s (ref 24–36)

## 2023-09-17 MED ORDER — LACTATED RINGERS IV SOLN: INTRAVENOUS | Status: AC

## 2023-09-17 MED ORDER — ACETAMINOPHEN 325 MG RE SUPP: 650.0000 mg | Freq: Four times a day (QID) | RECTAL | Status: DC | PRN

## 2023-09-17 MED ORDER — SODIUM CHLORIDE 0.9 % IV SOLN
2.0000 g | Freq: Two times a day (BID) | INTRAVENOUS | Status: DC
  Administered 2023-09-18 – 2023-09-19 (×3): 2 g via INTRAVENOUS
  Filled 2023-09-17 (×3): qty 12.5

## 2023-09-17 MED ORDER — VANCOMYCIN HCL 750 MG/150ML IV SOLN
750.0000 mg | Freq: Once | INTRAVENOUS | Status: AC
  Administered 2023-09-17: 750 mg via INTRAVENOUS
  Filled 2023-09-17: qty 150

## 2023-09-17 MED ORDER — IOHEXOL 300 MG/ML  SOLN
75.0000 mL | Freq: Once | INTRAMUSCULAR | Status: AC | PRN
  Administered 2023-09-17: 75 mL via INTRAVENOUS

## 2023-09-17 MED ORDER — ONDANSETRON HCL 4 MG PO TABS: 4.0000 mg | ORAL_TABLET | Freq: Four times a day (QID) | ORAL | Status: DC | PRN

## 2023-09-17 MED ORDER — METRONIDAZOLE 500 MG/100ML IV SOLN: 500.0000 mg | Freq: Two times a day (BID) | INTRAVENOUS | Status: DC

## 2023-09-17 MED ORDER — VANCOMYCIN HCL IN DEXTROSE 1-5 GM/200ML-% IV SOLN: 1000.0000 mg | INTRAVENOUS | Status: DC

## 2023-09-17 MED ORDER — CYANOCOBALAMIN 500 MCG PO TABS
1000.0000 ug | ORAL_TABLET | Freq: Every day | ORAL | Status: DC
  Administered 2023-09-18 – 2023-09-19 (×2): 1000 ug via ORAL
  Filled 2023-09-17 (×2): qty 2

## 2023-09-17 MED ORDER — VANCOMYCIN HCL IN DEXTROSE 1-5 GM/200ML-% IV SOLN
1000.0000 mg | Freq: Once | INTRAVENOUS | Status: AC
  Administered 2023-09-17: 1000 mg via INTRAVENOUS
  Filled 2023-09-17: qty 200

## 2023-09-17 MED ORDER — SIMETHICONE 80 MG PO CHEW: 80.0000 mg | CHEWABLE_TABLET | Freq: Four times a day (QID) | ORAL | Status: DC | PRN

## 2023-09-17 MED ORDER — SODIUM CHLORIDE 0.9 % IV SOLN
2.0000 g | Freq: Once | INTRAVENOUS | Status: AC
  Administered 2023-09-17: 2 g via INTRAVENOUS
  Filled 2023-09-17: qty 12.5

## 2023-09-17 MED ORDER — METRONIDAZOLE 500 MG/100ML IV SOLN
500.0000 mg | Freq: Once | INTRAVENOUS | Status: AC
  Administered 2023-09-17: 500 mg via INTRAVENOUS
  Filled 2023-09-17: qty 100

## 2023-09-17 MED ORDER — SODIUM CHLORIDE 0.9 % IV BOLUS
1000.0000 mL | Freq: Once | INTRAVENOUS | Status: AC
  Administered 2023-09-17: 1000 mL via INTRAVENOUS

## 2023-09-17 MED ORDER — ONDANSETRON HCL 4 MG/2ML IJ SOLN: 4.0000 mg | Freq: Four times a day (QID) | INTRAMUSCULAR | Status: DC | PRN

## 2023-09-17 MED ORDER — METRONIDAZOLE 500 MG/100ML IV SOLN
500.0000 mg | Freq: Two times a day (BID) | INTRAVENOUS | Status: DC
  Administered 2023-09-18 – 2023-09-19 (×3): 500 mg via INTRAVENOUS
  Filled 2023-09-17 (×3): qty 100

## 2023-09-17 MED ORDER — PANTOPRAZOLE SODIUM 40 MG PO TBEC
40.0000 mg | DELAYED_RELEASE_TABLET | Freq: Every day | ORAL | Status: DC
Start: 2023-09-18 — End: 2023-09-19
  Administered 2023-09-18 – 2023-09-19 (×2): 40 mg via ORAL
  Filled 2023-09-17 (×2): qty 1

## 2023-09-17 MED ORDER — ACETAMINOPHEN 325 MG PO TABS: 650.0000 mg | ORAL_TABLET | Freq: Four times a day (QID) | ORAL | Status: DC | PRN

## 2023-09-17 NOTE — ED Provider Notes (Signed)
[facility] Provider Note    Event Date/Time   First MD Initiated Contact with Patient 09/17/23 1213     (approximate)   History   Leg Swelling, Weakness, and Wound Infection   HPI  Karl Norman is a 77 y.o. male past medical history significant for recurrent choledocholithiasis with gallstone pancreatitis status post ERCP with complicated intra-abdominal abscess requiring multiple drains presents to the emergency department with generalized weakness.  Patient states that he has been having ongoing problems since his gallbladder was removed in May of this year and has had multiple infections in that area.  Currently does not have a drain in place but states that he has a gauze over where his wound continues to drain.  States today he had worsening generalized weakness and worsening swelling to his lower legs.  States that he does have a history of heart failure.  Denies any shortness of breath or chest pain.  Denies fever or chills.  No recent antibiotic use.  Complaining of some abdominal pain.  Denies any nausea or vomiting but does endorse ongoing diarrhea which has been present since May.  Denies any blood in his stool.     Physical Exam   Triage Vital Signs: ED Triage Vitals  Encounter Vitals Group     BP 09/17/23 1102 (!) 70/46     Systolic BP Percentile --      Diastolic BP Percentile --      Pulse Rate 09/17/23 1102 82     Resp 09/17/23 1102 16     Temp 09/17/23 1102 98.1 F (36.7 C)     Temp Source 09/17/23 1102 Oral     SpO2 09/17/23 1102 (!) 89 %     Weight 09/17/23 1103 162 lb (73.5 kg)     Height 09/17/23 1103 5\' 8"  (1.727 m)     Head Circumference --      Peak Flow --      Pain Score 09/17/23 1102 10     Pain Loc --      Pain Education --      Exclude from Growth Chart --     Most recent vital signs: Vitals:   09/17/23 1400 09/17/23 1530  BP:  109/65  Pulse: 88   Resp: 14 15  Temp:    SpO2: 92%     Physical  Exam Constitutional:      Appearance: He is well-developed. He is ill-appearing.  HENT:     Head: Atraumatic.  Eyes:     Conjunctiva/sclera: Conjunctivae normal.  Cardiovascular:     Rate and Rhythm: Regular rhythm.  Pulmonary:     Effort: No respiratory distress.  Abdominal:     Tenderness: There is abdominal tenderness.     Comments: Dressing to the right upper abdomen.  Right upper quadrant abdominal tenderness to palpation.  Musculoskeletal:     Cervical back: Normal range of motion.     Right lower leg: Edema present.     Left lower leg: Edema present.     Comments: +4 pitting edema to bilateral lower extremities  Skin:    General: Skin is warm.     Capillary Refill: Capillary refill takes 2 to 3 seconds.  Neurological:     Mental Status: He is alert. Mental status is at baseline.     IMPRESSION / MDM / ASSESSMENT AND PLAN / ED COURSE  I reviewed the triage vital signs and the nursing notes.  On chart review patient  has been evaluated and had a history of choledocholithiasis and has had an ERCP has had multiple drains in place and had an ongoing abscess to his right upper abdomen.  Differential diagnosis including sepsis, dehydration, intra-abdominal abscess, heart failure exacerbation  On arrival patient significantly hypotensive with a blood pressure of 70/46.  Felt that 30 cc/kg of IV fluids may be detrimental given his history of heart failure, given 1 L of IV fluids and will reevaluate.  Started on broad-spectrum antibiotics to cover for sepsis of unknown source.  EKG  I, Corena Herter, the attending physician, personally viewed and interpreted this ECG.  Normal rate.  Underlying right bundle branch block with some ST depression to the septal leads.  Normal intervals.  No obvious chamber enlargement.  RADIOLOGY CT chest abdomen pelvis with contrast was ordered, IV infiltrated with IV contrast so unable to do contrasted scan.  Ordered CT scan noncontrasted of  chest abdomen and pelvis to evaluate for infectious source, discussed with patient and radiologist if unable to determine underlying abscess may need to repeat this study with IV contrast.  LABS (all labs ordered are listed, but only abnormal results are displayed) Labs interpreted as -    Labs Reviewed  CBC - Abnormal; Notable for the following components:      Result Value   WBC 12.2 (*)    Hemoglobin 12.5 (*)    RDW 16.4 (*)    All other components within normal limits  COMPREHENSIVE METABOLIC PANEL - Abnormal; Notable for the following components:   Sodium 134 (*)    Potassium 3.1 (*)    Chloride 97 (*)    Creatinine, Ser 1.54 (*)    Calcium 7.8 (*)    Total Protein 5.9 (*)    Albumin 1.9 (*)    GFR, Estimated 47 (*)    All other components within normal limits  LACTIC ACID, PLASMA - Abnormal; Notable for the following components:   Lactic Acid, Venous 6.8 (*)    All other components within normal limits  LACTIC ACID, PLASMA - Abnormal; Notable for the following components:   Lactic Acid, Venous 2.3 (*)    All other components within normal limits  BRAIN NATRIURETIC PEPTIDE - Abnormal; Notable for the following components:   B Natriuretic Peptide 178.9 (*)    All other components within normal limits  PROTIME-INR - Abnormal; Notable for the following components:   Prothrombin Time 16.7 (*)    INR 1.3 (*)    All other components within normal limits  URINALYSIS, W/ REFLEX TO CULTURE (INFECTION SUSPECTED) - Abnormal; Notable for the following components:   Color, Urine YELLOW (*)    APPearance CLEAR (*)    All other components within normal limits  TROPONIN I (HIGH SENSITIVITY) - Abnormal; Notable for the following components:   Troponin I (High Sensitivity) 28 (*)    All other components within normal limits  TROPONIN I (HIGH SENSITIVITY) - Abnormal; Notable for the following components:   Troponin I (High Sensitivity) 23 (*)    All other components within normal  limits  CULTURE, BLOOD (ROUTINE X 2)  CULTURE, BLOOD (ROUTINE X 2)  APTT     MDM  Started on broad-spectrum antibiotics to cover for sepsis of unknown source.  After 1 L of IV fluids had significant improvement of his blood pressure.  Will hold on any further IV fluids at this time given his significant heart failure and pitting edema.  Initial lactic acid significantly elevated at 6.8.  Repeat lactic acid downtrending to 2.3.  Does have leukocytosis.  Currently waiting for CT scan.  Care transferred to incoming provider, patient will need admitted for sepsis.     PROCEDURES:  Critical Care performed: yes  .Critical Care  Performed by: Corena Herter, MD Authorized by: Corena Herter, MD   Critical care provider statement:    Critical care time (minutes):  60   Critical care time was exclusive of:  Separately billable procedures and treating other patients   Critical care was necessary to treat or prevent imminent or life-threatening deterioration of the following conditions:  Sepsis   Critical care was time spent personally by me on the following activities:  Development of treatment plan with patient or surrogate, discussions with consultants, evaluation of patient's response to treatment, examination of patient, ordering and review of laboratory studies, ordering and review of radiographic studies, ordering and performing treatments and interventions, pulse oximetry, re-evaluation of patient's condition and review of old charts   Patient's presentation is most consistent with acute presentation with potential threat to life or bodily function.   MEDICATIONS ORDERED IN ED: Medications  lactated ringers infusion ( Intravenous New Bag/Given 09/17/23 1443)  vancomycin (VANCOCIN) IVPB 1000 mg/200 mL premix (1,000 mg Intravenous New Bag/Given 09/17/23 1544)  sodium chloride 0.9 % bolus 1,000 mL (0 mLs Intravenous Stopped 09/17/23 1341)  iohexol (OMNIPAQUE) 300 MG/ML solution 75  mL (75 mLs Intravenous Contrast Given 09/17/23 1407)  ceFEPIme (MAXIPIME) 2 g in sodium chloride 0.9 % 100 mL IVPB (0 g Intravenous Stopped 09/17/23 1431)  metroNIDAZOLE (FLAGYL) IVPB 500 mg (0 mg Intravenous Stopped 09/17/23 1541)    FINAL CLINICAL IMPRESSION(S) / ED DIAGNOSES   Final diagnoses:  Weakness  Dehydration  Hypotension, unspecified hypotension type     Rx / DC Orders   ED Discharge Orders     None        Note:  This document was prepared using Dragon voice recognition software and may include unintentional dictation errors.   Corena Herter, MD 09/17/23 [phone]

## 2023-09-17 NOTE — Assessment & Plan Note (Addendum)
With purulence surrounding site of prior drain placement Continue with broad-spectrum antibiotic, cefepime, metronidazole, vancomycin per pharmacy Blood cultures x 2 are in process

## 2023-09-17 NOTE — Assessment & Plan Note (Addendum)
Strict I's and O Complete echo ordered admission No IV diuresis as patient had low blood pressure to low normotensive on admission and meeting severe sepsis criteria Home Entresto daily (patient no longer taking twice daily low blood pressure) not resumed on admission due to low blood pressure and AKI Patient reports he is no longer taking metoprolol tartrate 25 mg twice daily and he takes Lasix as needed

## 2023-09-17 NOTE — H&P (Signed)
History and Physical   Karl Norman:[id number] DOB: [birth year] DOA: 09/17/2023  PCP: Marguarite Arbour, MD  Outpatient Specialists: Dr. Veneda Melter, [facility] surgery Patient coming from: Home  I have personally briefly reviewed patient's old medical records in [facility] EMR.  Chief Concern: Weakness  HPI: [name] is a 77 year old male with history of recurrent choledocholithiasis, with history of gallstone pancreatitis status post ERCP with complicated intra-abdominal abscess requiring multiple drain placement, history of hypertension, paroxysmal atrial fibrillation, heart failure reduced ejection fraction, chronic hepatitis C, GERD, who presents emergency department for chief concerns of generalized weakness, worsening leg swelling and wound infection.  Vitals in the ED showed temperature of 98.1, respiration rate 16, heart rate of 82, blood pressure initially 70/46, currently improved to 112/76, SpO2 of 96% on room air.  Serum sodium is 134, potassium 3.1, chloride 97, bicarb 22, BUN of 21, serum creatinine 1.54, EGFR 47, nonfasting blood glucose 86, WBC 12.2, hemoglobin 12.5, platelets of 319.  BMP is elevated at 178.9.  High sensitive troponin is 28 and on repeat was 23.  Lactic acid is 6.8 and on repeat is 2.3.  UA was negative for leukocytes and nitrates.  ED treatment: Cefepime 2 g IV, Flagyl, vancomycin, sodium chloride 1 L bolus, LR infusion at 150 mL/h. ---------------------------- At bedside, he is able to tell me his first and last name, age, current calendar year, and current location of [place], [place].   He reports he has been feeling weak for the last 4 days. Too weak to even walk. He states he thinks he gained a total of 60 pounds since Sunday. He reports he took 2 or 3 doses of his as needed lasix at home trying to get the weight off of him. He denies fever, chest pain. He endorses nausea and he vomitted twice, he doesn't remember. He has had poor appetite for two  weeks.  He endorses dysuria that started about 4 days. He denies blood in his urine. He endorses 7 watery bowel movements per day since gallbladder removal on May 5th.   Social history: He lives with his wife and two dogs. He denies etoh and recreational drug use. He occasionally drinks etoh, last drink was 3 weeks ago.   ROS: Constitutional: no weight change, no fever ENT/Mouth: no sore throat, no rhinorrhea Eyes: no eye pain, no vision changes Cardiovascular: no chest pain, no dyspnea,  no edema, no palpitations Respiratory: no cough, no sputum, no wheezing Gastrointestinal: no nausea, no vomiting, no diarrhea, no constipation Genitourinary: no urinary incontinence, no dysuria, no hematuria Musculoskeletal: no arthralgias, no myalgias Skin: no skin lesions, no pruritus, + wound infection with purulence Neuro: + weakness, no loss of consciousness, no syncope Psych: no anxiety, no depression, + decrease appetite Heme/Lymph: no bruising, no bleeding  ED Course: Discussed with EDP, patient requiring hospitalization for chief concerns of intra-abdominal infection, meeting sepsis criteria.  Assessment/Plan  Principal Problem:   Severe sepsis with acute organ dysfunction (HCC) Active Problems:   Cellulitis   Acute exacerbation of CHF (congestive heart failure) (HCC)   Postprocedural intraabdominal abscess (HCC)   AKI (acute kidney injury) (HCC)   Paroxysmal A-fib (HCC)   Leukocytosis   Assessment and Plan:  * Severe sepsis with acute organ dysfunction (HCC) Elevated lactic acid, leukocytosis, acute kidney injury, source of infection intra-abdominal abscess Etiology workup in progress, complicated by history of recurrent intra-abdominal abscesses requiring multiple drainage Blood cultures x 2 are in process General Surgery has been consulted and  recommends IR consultation EDP has consulted IR at general surgery request.  Per EDP note, IR attending did not feel patient's site was  amendable to repeat IR drain placement.  Dr. Maurine Minister, general surgery is aware Continue with broad-spectrum antibiotic including cefepime, metronidazole, vancomycin Maintain MAP greater than 65 Admit to PCU, inpatient  Paroxysmal A-fib (HCC) Home Eliquis not resumed on admission AM team to resume home Eliquis when the benefits outweigh the risk  AKI (acute kidney injury) (HCC) Likely multifactorial in setting of severe sepsis complicated by heart failure exacerbation Strict I's and O's Patient is status post sodium chloride 1 L bolus and will continue with LR infusion at 150 mL/h, 20 hours ordered No further bolus on admission due to patient with possible heart failure exacerbation Recheck BMP in the a.m.  Postprocedural intraabdominal abscess (HCC) Broad-spectrum antibiotic, blood cultures x 2 are in process Treatment per above General Surgery and IR have been consulted by EDP  Acute exacerbation of CHF (congestive heart failure) (HCC) Strict I's and O Complete echo ordered admission No IV diuresis as patient had low blood pressure to low normotensive on admission and meeting severe sepsis criteria Home Entresto daily (patient no longer taking twice daily low blood pressure) not resumed on admission due to low blood pressure and AKI Patient reports he is no longer taking metoprolol tartrate 25 mg twice daily and he takes Lasix as needed  Cellulitis With purulence surrounding site of prior drain placement Continue with broad-spectrum antibiotic, cefepime, metronidazole, vancomycin per pharmacy Blood cultures x 2 are in process   Chart reviewed.   DVT prophylaxis: TED hose; DVT prophylaxis not initiated on admission due to possible risk of infection if patient needs surgery. AM team to initiate pharmacologic DVT prophylaxis when the benefits outweigh the risk Code Status: Full code Diet: Heart healthy diet Family Communication: A phone call was offered, patient declined stating  that his wife has already been updated Disposition Plan: Pending clinical course; guarded prognosis Consults called: General Surgery, IR per EDP Admission status: PCU, inpatient  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   Pancreatitis    Pneumatouria    Past Surgical History:  Procedure Laterality Date   CARDIOVERSION N/A 10/24/2020   Procedure: CARDIOVERSION;  Surgeon: Lamar Blinks, MD;  Location: [facility] ORS;  Service: Cardiovascular;  Laterality: N/A;   COLONOSCOPY WITH PROPOFOL N/A 08/15/2018   Procedure: COLONOSCOPY WITH PROPOFOL;  Surgeon: Scot Jun, MD;  Location: [facility] ENDOSCOPY;  Service: Endoscopy;  Laterality: N/A;   COLONOSCOPY WITH PROPOFOL N/A 01/20/2022   Procedure: COLONOSCOPY WITH PROPOFOL;  Surgeon: Regis Bill, MD;  Location: [facility] ENDOSCOPY;  Service: Endoscopy;  Laterality: N/A;   ERCP with stent  07/22/2018   ESOPHAGOGASTRODUODENOSCOPY     6 times   ESOPHAGOGASTRODUODENOSCOPY (EGD) WITH PROPOFOL N/A 01/20/2022   Procedure: ESOPHAGOGASTRODUODENOSCOPY (EGD) WITH PROPOFOL;  Surgeon: Regis Bill, MD;  Location: [facility] ENDOSCOPY;  Service: Endoscopy;  Laterality: N/A;   REPLACEMENT TOTAL KNEE     TEE  WITHOUT CARDIOVERSION N/A 11/11/2015   Procedure: TRANSESOPHAGEAL ECHOCARDIOGRAM (TEE);  Surgeon: Lamar Blinks, MD;  Location: [facility] ORS;  Service: Cardiovascular;  Laterality: N/A;   TEE WITHOUT CARDIOVERSION N/A 10/24/2020   Procedure: TRANSESOPHAGEAL ECHOCARDIOGRAM (TEE);  Surgeon: Lamar Blinks, MD;  Location: [facility] ORS;   Service: Cardiovascular;  Laterality: N/A;   Social History:  reports that he has quit smoking. He has never used smokeless tobacco. He reports current alcohol use. He reports that he does not currently use drugs.  Allergies  Allergen Reactions   Penicillins     Has patient had a PCN reaction causing immediate rash, facial/tongue/throat swelling, SOB or lightheadedness with hypotension: NO Has patient had a PCN reaction causing severe rash involving mucus membranes or skin necrosis: YES Has patient had a PCN reaction that required hospitalization: YES Has patient had a PCN reaction occurring within the last 10 years: NO If all of the above answers are "NO", then may proceed with Cephalosporin use.    Family History  Problem Relation Age of Onset   Heart failure Father    Leukemia Brother    Family history: Family history reviewed and not pertinent.  Prior to Admission medications   Medication Sig Start Date End Date Taking? Authorizing Provider  apixaban (ELIQUIS) 5 MG TABS tablet Take 1 tablet (5 mg total) by mouth 2 (two) times daily. 02/06/21   Delma Freeze, FNP  b complex vitamins capsule Take 1 capsule by mouth daily.    [provider]  ferrous sulfate 324 MG TBEC Take 324 mg by mouth daily as needed.    [provider]  furosemide (LASIX) 40 MG tablet Take 1 tablet (40 mg total) by mouth daily. 10/26/20   Willeen Niece, MD  ibuprofen (ADVIL) 200 MG tablet Take 400 mg by mouth every 6 (six) hours as needed for mild pain.    [provider]  metoprolol tartrate (LOPRESSOR) 25 MG tablet Take 1 tablet (25 mg total) by mouth 2 (two) times daily. 10/25/20   Willeen Niece, MD  omeprazole (PRILOSEC) 20 MG capsule Take 20 mg by mouth daily.    [provider]  sacubitril-valsartan (ENTRESTO) 24-26 MG Take 1 tablet by mouth 2 (two) times daily. 02/06/21   Delma Freeze, FNP  simethicone (MYLICON) 80 MG chewable tablet Chew 80 mg by mouth every 6  (six) hours as needed for flatulence.    [provider]  vitamin B-12 (CYANOCOBALAMIN) 1000 MCG tablet Take 1,000 mcg by mouth daily.    [provider]    Physical Exam: Vitals:   09/17/23 1700 09/17/23 1730 09/17/23 1800 09/17/23 1830  BP: (!) 101/57 109/73 112/76 93/79  Pulse:  84 93 88  Resp: 10 15 14  (!) 21  Temp:      TempSrc:      SpO2:  100% 97% 98%  Weight:      Height:       Constitutional: appears older than chronological age, frail, cachectic Eyes: PERRL, lids and conjunctivae normal ENMT: Mucous membranes are moist. Posterior pharynx clear of any exudate or lesions. Age-appropriate dentition. Hearing appropriate/ Neck: normal, supple, no masses, no thyromegaly Respiratory: clear to auscultation bilaterally, no wheezing, no crackles. Normal respiratory effort. No accessory muscle use.  Cardiovascular: Regular rate and rhythm, no murmurs / rubs / gallops.  Bilateral lower extremity 3-4+ edema edema. 2+ pedal pulses. No carotid bruits.  Abdomen: no tenderness, no masses palpated, no hepatosplenomegaly. Bowel sounds positive.  Musculoskeletal: no clubbing /  cyanosis. No joint deformity upper and lower extremities. Good ROM, no contractures, no atrophy. Normal muscle tone.  Skin: Right flank and right upper quadrant erythema and edema with demarcation and purulence consistent with cellulitis surrounding site of prior drain  Neurologic: Sensation intact. Strength 5/5 in all 4.  Psychiatric: Normal judgment and insight. Alert and oriented x 3. Normal mood.   EKG: independently reviewed, showing sinus rhythm with rate of 94, QTc 495  Chest x-ray on Admission: I personally reviewed and I agree with radiologist reading as below.  CT CHEST ABDOMEN PELVIS WO CONTRAST Result Date: 09/17/2023 CLINICAL DATA:  Recurrent surgical site infection status post cholecystectomy EXAM: CT CHEST, ABDOMEN AND PELVIS WITHOUT CONTRAST TECHNIQUE: Multidetector CT imaging of the  chest, abdomen and pelvis was performed following the standard protocol without IV contrast. RADIATION DOSE REDUCTION: This exam was performed according to the departmental dose-optimization program which includes automated exposure control, adjustment of the mA and/or kV according to patient size and/or use of iterative reconstruction technique. COMPARISON:  CT abdomen and pelvis dated 05/05/2023 FINDINGS: CT CHEST FINDINGS Cardiovascular: Normal heart size. No significant pericardial fluid/thickening. Ascending thoracic aorta measures 4.1 x 3.7 cm. No central pulmonary emboli. Coronary artery calcifications. Mediastinum/Nodes: Imaged thyroid gland without nodules meeting criteria for imaging follow-up by size. Normal esophagus. No pathologically enlarged axillary, supraclavicular, mediastinal, or hilar lymph nodes. Lungs/Pleura: The central airways are patent. Moderate upper lobe predominant centrilobular and paraseptal emphysema. No focal consolidation. No pneumothorax. Trace right pleural effusion. Musculoskeletal: No acute or abnormal lytic or blastic osseous lesions. Multilevel degenerative changes of the thoracic spine. CT ABDOMEN PELVIS FINDINGS Hepatobiliary: Diffuse parenchymal hypoattenuation can be seen with hepatic steatosis. Persistent pneumobilia. No intra or extrahepatic biliary ductal dilation. Cholecystectomy Pancreas: Persistent pancreatic ductal dilation measuring up to 10 mm to the level of an intraductal stone in the pancreatic head. Pancreatic parenchymal atrophy in keeping with sequela of prior chronic pancreatitis. Spleen: Normal in size without focal abnormality. Adrenals/Urinary Tract: 14 mm right adrenal nodule (2:58), previously characterized as an adenoma measures 2 HU. No specific follow-up imaging recommended. No left adrenal nodule. No suspicious renal mass on this noncontrast enhanced examination, calculi, or hydronephrosis. No focal bladder wall thickening. Stomach/Bowel: Normal  appearance of the stomach. No evidence of bowel wall thickening, distention, or inflammatory changes. Normal appendix. Vascular/Lymphatic: Aortic atherosclerosis. No enlarged abdominal or pelvic lymph nodes. Reproductive: Prostate is unremarkable. Other: No free air or fluid collection. Interval removal of right lateral approach drainage catheter, previously located in the right paracolic gutter. A small linear density at the catheter tract site remains (2:71). Persistent crescentic fluid collection extending inferiorly along the right paracolic gutter from this area measures 8.0 x 1.7 cm (2:77) previously 5.2 x 0.9 cm when measured similarly. Musculoskeletal: No acute or abnormal lytic or blastic osseous findings. Multilevel degenerative changes of the lumbar spine. Increased subcutaneous soft tissue edema of the right flank. IMPRESSION: 1. Interval removal of right lateral approach drainage catheter, previously located in the right paracolic gutter. Persistent crescentic fluid collection extending inferiorly along the right paracolic gutter from this area measures 8.0 x 1.7 cm, previously 5.2 x 0.9 cm when measured similarly. 2. Increased subcutaneous soft tissue edema of the right flank. 3. Trace right pleural effusion. 4. Aortic Atherosclerosis (ICD10-I70.0) and Emphysema (ICD10-J43.9). Coronary artery calcifications. Assessment for potential risk factor modification, dietary therapy or pharmacologic therapy may be warranted, if clinically indicated. 5. Ascending thoracic aorta measures 4.1 cm. Recommend annual imaging followup by CTA or MRA.  This recommendation follows 2010 ACCF/AHA/AATS/ACR/ASA/SCA/SCAI/SIR/STS/SVM Guidelines for the Diagnosis and Management of Patients with Thoracic Aortic Disease. Circulation. 2010; 121: Y403-K742. Aortic aneurysm NOS (ICD10-I71.9) Electronically Signed   By: Agustin Cree M.D.   On: 09/17/2023 15:51   Labs on Admission: I have personally reviewed following  labs  CBC: Recent Labs  Lab 09/17/23 1107  WBC 12.2*  HGB 12.5*  HCT 39.2  MCV 89.3  PLT 319   Basic Metabolic Panel: Recent Labs  Lab 09/17/23 1137  NA 134*  K 3.1*  CL 97*  CO2 22  GLUCOSE 86  BUN 21  CREATININE 1.54*  CALCIUM 7.8*   GFR: Estimated Creatinine Clearance: 40.1 mL/min (A) (by C-G formula based on SCr of 1.54 mg/dL (H)).  Liver Function Tests: Recent Labs  Lab 09/17/23 1137  AST 30  ALT 13  ALKPHOS 101  BILITOT 0.6  PROT 5.9*  ALBUMIN 1.9*   Coagulation Profile: Recent Labs  Lab 09/17/23 1355  INR 1.3*   Urine analysis:    Component Value Date/Time   COLORURINE YELLOW (A) 09/17/2023 1545   APPEARANCEUR CLEAR (A) 09/17/2023 1545   LABSPEC 1.005 09/17/2023 1545   PHURINE 6.0 09/17/2023 1545   GLUCOSEU NEGATIVE 09/17/2023 1545   HGBUR NEGATIVE 09/17/2023 1545   BILIRUBINUR NEGATIVE 09/17/2023 1545   KETONESUR NEGATIVE 09/17/2023 1545   PROTEINUR NEGATIVE 09/17/2023 1545   NITRITE NEGATIVE 09/17/2023 1545   LEUKOCYTESUR NEGATIVE 09/17/2023 1545   CRITICAL CARE Performed by: Dr Sedalia Muta  Total critical care time: 32 minutes  Critical care time was exclusive of separately billable procedures and treating other patients.  Critical care was necessary to treat or prevent imminent or life-threatening deterioration.  Critical care was time spent personally by me on the following activities: development of treatment plan with patient and/or surrogate as well as nursing, discussions with consultants, evaluation of patient's response to treatment, examination of patient, obtaining history from patient or surrogate, ordering and performing treatments and interventions, ordering and review of laboratory studies, ordering and review of radiographic studies, pulse oximetry and re-evaluation of patient's condition.  This document was prepared using Dragon Voice Recognition software and may include unintentional dictation errors.  Dr. Sedalia Muta [facility]  If 7PM-7AM, please contact overnight-coverage provider If 7AM-7PM, please contact day attending provider [email or web address]  09/17/2023, 7:09 PM

## 2023-09-17 NOTE — Progress Notes (Signed)
CODE SEPSIS - PHARMACY COMMUNICATION  **Broad Spectrum Antibiotics should be administered within 1 hour of Sepsis diagnosis**  Time Code Sepsis Called/Page Received: 1310  Antibiotics Ordered: cefepime, vancomycin  Time of 1st antibiotic administration: 1356  Additional action taken by pharmacy: messaged RN  If necessary, Name of Provider/Nurse Contacted: Rexford Maus, RN    Merryl Hacker ,PharmD Clinical Pharmacist  09/17/2023  1:10 PM

## 2023-09-17 NOTE — Assessment & Plan Note (Addendum)
Elevated lactic acid, leukocytosis, acute kidney injury, source of infection intra-abdominal abscess Etiology workup in progress, complicated by history of recurrent intra-abdominal abscesses requiring multiple drainage Blood cultures x 2 are in process General Surgery has been consulted and recommends IR consultation EDP has consulted IR at general surgery request.  Per EDP note, IR attending did not feel patient's site was amendable to repeat IR drain placement.  Dr. Maurine Minister, general surgery is aware Continue with broad-spectrum antibiotic including cefepime, metronidazole, vancomycin Maintain MAP greater than 65 Admit to PCU, inpatient

## 2023-09-17 NOTE — Progress Notes (Signed)
Pharmacy Antibiotic Note  Karl Norman is a 77 y.o. male admitted on 09/17/2023 with  intra-abdominal infection .  Pharmacy has been consulted for vancomycin and cefepime dosing x 7 days.  Plan: Day 1 of antibiotics Give vancomycin 750 mg IV x1 to complete loading dose followed by 1000 mg IV Q24H. Goal AUC 400-550. Expected AUC: 501.5 Expected Css min: 13.3 SCr used: 1.54  Weight used: IBW, Vd used: 0.72 (BMI 24.63) Start cefepime 2 g IV Q12H Patient is also on metronidazole 500 mg IV Q12H Continue to monitor renal function and follow culture results   Height: 5\' 8"  (172.7 cm) Weight: 73.5 kg (162 lb) IBW/kg (Calculated) : 68.4  Temp (24hrs), Avg:98.1 F (36.7 C), Min:98.1 F (36.7 C), Max:98.1 F (36.7 C)  Recent Labs  Lab 09/17/23 1107 09/17/23 1137 09/17/23 1355  WBC 12.2*  --   --   CREATININE  --  1.54*  --   LATICACIDVEN  --  6.8* 2.3*    Estimated Creatinine Clearance: 40.1 mL/min (A) (by C-G formula based on SCr of 1.54 mg/dL (H)).    Allergies  Allergen Reactions   Penicillins     Has patient had a PCN reaction causing immediate rash, facial/tongue/throat swelling, SOB or lightheadedness with hypotension: NO Has patient had a PCN reaction causing severe rash involving mucus membranes or skin necrosis: YES Has patient had a PCN reaction that required hospitalization: YES Has patient had a PCN reaction occurring within the last 10 years: NO If all of the above answers are "NO", then may proceed with Cephalosporin use.     Antimicrobials this admission: 12/27 Cefepime >>  12/27 Metronidazole >>  12/27 Vancomycin>>  Dose adjustments this admission: None  Microbiology results: 12/27 BCx: IP   Thank you for allowing pharmacy to be a part of this patient's care.  Merryl Hacker, PharmD Clinical Pharmacist 09/17/2023 5:18 PM

## 2023-09-17 NOTE — Assessment & Plan Note (Signed)
Likely multifactorial in setting of severe sepsis complicated by heart failure exacerbation Strict I's and O's Patient is status post sodium chloride 1 L bolus and will continue with LR infusion at 150 mL/h, 20 hours ordered No further bolus on admission due to patient with possible heart failure exacerbation Recheck BMP in the a.m.

## 2023-09-17 NOTE — Sepsis Progress Note (Signed)
Elink following sepsis protocol. Thank you.

## 2023-09-17 NOTE — ED Triage Notes (Signed)
Pt arrives via ems from home, pt had his gallbladder removed may 5th, 2024, has had multiple infections in the area since, reports it is now oozing infection again from the site, the gauze appears saturated with a green substance, pt does not know what type of infection it was, pt has bilat lower extrem swelling with weeping from the legs,  Ems reports vitals as 86/54, 90's, 99% ra, cbg 129, 97.9 temp, end tidal of 25

## 2023-09-17 NOTE — Hospital Course (Signed)
[name] is a 76 year old male with history of recurrent choledocholithiasis, with history of gallstone pancreatitis status post ERCP with complicated intra-abdominal abscess requiring multiple drain placement, history of hypertension, paroxysmal atrial fibrillation, heart failure reduced ejection fraction, chronic hepatitis C, GERD, who presents emergency department for chief concerns of generalized weakness, worsening leg swelling and wound infection.  Vitals in the ED showed temperature of 98.1, respiration rate 16, heart rate of 82, blood pressure initially 70/46, currently improved to 112/76, SpO2 of 96% on room air.  Serum sodium is 134, potassium 3.1, chloride 97, bicarb 22, BUN of 21, serum creatinine 1.54, EGFR 47, nonfasting blood glucose 86, WBC 12.2, hemoglobin 12.5, platelets of 319.  BMP is elevated at 178.9.  High sensitive troponin is 28 and on repeat was 23.  Lactic acid is 6.8 and on repeat is 2.3.  UA was negative for leukocytes and nitrates.  ED treatment: Cefepime 2 g IV, Flagyl, vancomycin, sodium chloride 1 L bolus, LR infusion at 150 mL/h.

## 2023-09-17 NOTE — Assessment & Plan Note (Signed)
Home Eliquis not resumed on admission AM team to resume home Eliquis when the benefits outweigh the risk

## 2023-09-17 NOTE — ED Notes (Signed)
New dressing to side of pt placed where gall bladder surgery and then wound was

## 2023-09-17 NOTE — Plan of Care (Signed)
Request to IR for possible drainage of intra-abdominal fluid collection.  Patient history and imaging reviewed by Dr. Archer Asa who notes the collection appears to be due to previous drain site and is not currently amenable to percutaneous drainage in IR. Would recommend continuing antibiotics and if patient's clinic picture worsens consider repeating CT to assess for enlargement of this collection.  No procedure planned in IR, will cancel order. Please call on call IR MD with any questions or concerns.  Lynnette Caffey, PA-C

## 2023-09-17 NOTE — ED Provider Notes (Signed)
Care of this patient assumed from prior physician at 1545 pending CT and anticipated admission. Please see prior physician note for further details.  77 year old male with history of choledocholithiasis, gallstone pancreatitis s/p cholecystectomy on 03/26/2023 complicated by intra-abdominal abscess presenting to the emergency department for evaluation of weakness and abdominal discomfort.  Patient hypotensive on presentation, improved with fluid resuscitation.  Labs with leukocytosis with WBC of 12.2.  Sepsis orders initiated with empiric cefepime and Flagyl.  30 cc/kg of fluid were deferred given concerns for acute volume overload with possible clinical worsening with large volume fluid resuscitation. He was given 1 L of IV fluids with improvement in his blood pressure. BNP elevated at 178.  Lactate 2.3.  Signed out to me pending CT chest, abdomen, pelvis.  Initially ordered with contrast, but IV had contrast extravasation, so this was changed to noncontrast.  CT demonstrated fluid collection along the right paracolic gutter measuring 8 x 1.7 cm, increased from prior CT from 05/05/2023 in the location of prior drainage catheter.  Increased soft tissue edema noted of the right flank.  On exam, patient with purulent drainage from site of prior drain, see image below concerning for recurrent intra-abdominal abscess.  Will review with general surgery.  4:40 PM Case reviewed with Dr. Maurine Minister with general surgery.  He recommends admission to the hospitalist service with consultation of IR for drain placement.  He will also evaluate the patient.  Patient has already received broad-spectrum antibiotics.  Will reach out to hospitalist team for admission.  5:13 PM Case reviewed with Dr. Sedalia Muta with hospitalist team.  She will evaluate the patient for anticipated admission.  I was also messaged by Burt Ek, PA with IR team.  She reported that she had reviewed the case with her attending who did not feel that the  patient's site was amenable to repeat IR drain placement.  Dr. Maurine Minister updated.       Trinna Post, MD 09/17/23 [phone]

## 2023-09-17 NOTE — ED Provider Triage Note (Signed)
Emergency Medicine Provider Triage Evaluation Note  Karl Norman , a 77 y.o. male  was evaluated in triage.  Pt complains of sepsis. Patient states he had his gallbladder removed in May and has had problems ever since. He reports increased leg swelling since Sunday. He is concerned about having developed another infection in his abdomen.   Review of Systems  Positive: Leg swelling, abdominal infection, generalized weakness, loss of appetite Negative: CP, SOB  Physical Exam  BP (!) 70/46 (BP Location: Left Arm)   Pulse 82   Temp 98.1 F (36.7 C) (Oral)   Resp 16   Ht 5\' 8"  (1.727 m)   Wt 73.5 kg   SpO2 (!) 89%   BMI 24.63 kg/m  Gen:   Awake, no distress   Resp:  Normal effort  MSK:   Moves extremities without difficulty  Other:    Medical Decision Making  Medically screening exam initiated at 11:09 AM.  Appropriate orders placed.  SHERON ORTT was informed that the remainder of the evaluation will be completed by another provider, this initial triage assessment does not replace that evaluation, and the importance of remaining in the ED until their evaluation is complete.     Cameron Ali, PA-C 09/17/23 1115

## 2023-09-17 NOTE — Assessment & Plan Note (Addendum)
Broad-spectrum antibiotic, blood cultures x 2 are in process Treatment per above General Surgery and IR have been consulted by EDP

## 2023-09-18 ENCOUNTER — Inpatient Hospital Stay
Admit: 2023-09-18 | Discharge: 2023-09-18 | Disposition: A | Discharge status: Home / Self Care | Payer: Medicare Other | Attending: Internal Medicine | Admitting: Internal Medicine

## 2023-09-18 DIAGNOSIS — L02211 Cutaneous abscess of abdominal wall: Secondary | ICD-10-CM | POA: Diagnosis not present

## 2023-09-18 DIAGNOSIS — L03311 Cellulitis of abdominal wall: Secondary | ICD-10-CM | POA: Diagnosis not present

## 2023-09-18 DIAGNOSIS — R6520 Severe sepsis without septic shock: Secondary | ICD-10-CM | POA: Diagnosis not present

## 2023-09-18 DIAGNOSIS — A419 Sepsis, unspecified organism: Secondary | ICD-10-CM | POA: Diagnosis not present

## 2023-09-18 LAB — CBC
HCT: 37 % — ABNORMAL LOW (ref 39.0–52.0)
Hemoglobin: 11.9 g/dL — ABNORMAL LOW (ref 13.0–17.0)
MCH: 28.5 pg (ref 26.0–34.0)
MCHC: 32.2 g/dL (ref 30.0–36.0)
MCV: 88.5 fL (ref 80.0–100.0)
Platelets: 284 10*3/uL (ref 150–400)
RBC: 4.18 MIL/uL — ABNORMAL LOW (ref 4.22–5.81)
RDW: 16.3 % — ABNORMAL HIGH (ref 11.5–15.5)
WBC: 9.7 10*3/uL (ref 4.0–10.5)
nRBC: 0 % (ref 0.0–0.2)

## 2023-09-18 LAB — C DIFFICILE QUICK SCREEN W PCR REFLEX
C Diff antigen: NEGATIVE
C Diff interpretation: NOT DETECTED
C Diff toxin: NEGATIVE

## 2023-09-18 LAB — GASTROINTESTINAL PANEL BY PCR, STOOL (REPLACES STOOL CULTURE)

## 2023-09-18 LAB — BASIC METABOLIC PANEL
Anion gap: 10 (ref 5–15)
BUN: 15 mg/dL (ref 8–23)
CO2: 23 mmol/L (ref 22–32)
Calcium: 7.4 mg/dL — ABNORMAL LOW (ref 8.9–10.3)
Chloride: 102 mmol/L (ref 98–111)
Creatinine, Ser: 1.1 mg/dL (ref 0.61–1.24)
GFR, Estimated: >60 mL/min (ref >60)
Glucose, Bld: 91 mg/dL (ref 70–99)
Potassium: 3.3 mmol/L — ABNORMAL LOW (ref 3.5–5.1)
Sodium: 135 mmol/L (ref 135–145)

## 2023-09-18 MED ORDER — VANCOMYCIN HCL 1250 MG/250ML IV SOLN
1250.0000 mg | INTRAVENOUS | Status: DC
  Administered 2023-09-18: 1250 mg via INTRAVENOUS
  Filled 2023-09-18 (×2): qty 250

## 2023-09-18 MED ORDER — LACTATED RINGERS IV SOLN: INTRAVENOUS | Status: AC

## 2023-09-18 NOTE — Progress Notes (Signed)
Echocardiogram attempted, patient care is needed will try later.

## 2023-09-18 NOTE — Progress Notes (Signed)
Pharmacy Antibiotic Note  Karl Norman is a 77 y.o. male admitted on 09/17/2023 with  intra-abdominal infection .  Pharmacy has been consulted for vancomycin and cefepime dosing x 7 days.  Plan: Day 2 of antibiotics  WBC 12.2>>9.7 ,  afebrile,  +norovirus Received vancomycin 1750 mg IV loading dose  Scr improved 1.54>>1.10 Will adjust Vancomycin to 1250 mg IV Q24H. Goal AUC 400-550. Expected AUC: 463 Expected Css min: 10.6 SCr used: 1.10  Weight used: IBW, Vd used: 0.72 (BMI 24.63) Continue cefepime 2 g IV Q12H Patient is also on metronidazole 500 mg IV Q12H Continue to monitor renal function and follow culture results   Height: 5\' 8"  (172.7 cm) Weight: 73.5 kg (162 lb) IBW/kg (Calculated) : 68.4  Temp (24hrs), Avg:97.5 F (36.4 C), Min:97.5 F (36.4 C), Max:97.5 F (36.4 C)  Recent Labs  Lab 09/17/23 1107 09/17/23 1137 09/17/23 1355 09/18/23 0621  WBC 12.2*  --   --  9.7  CREATININE  --  1.54*  --  1.10  LATICACIDVEN  --  6.8* 2.3*  --     Estimated Creatinine Clearance: 56.1 mL/min (by C-G formula based on SCr of 1.1 mg/dL).    Allergies  Allergen Reactions   Penicillins     Has patient had a PCN reaction causing immediate rash, facial/tongue/throat swelling, SOB or lightheadedness with hypotension: NO Has patient had a PCN reaction causing severe rash involving mucus membranes or skin necrosis: YES Has patient had a PCN reaction that required hospitalization: YES Has patient had a PCN reaction occurring within the last 10 years: NO If all of the above answers are "NO", then may proceed with Cephalosporin use.     Antimicrobials this admission: 12/27 Cefepime >>  12/27 Metronidazole >>  12/27 Vancomycin>>  Dose adjustments this admission: None  Microbiology results: 12/27 BCx: NGTD 12/28 GI panel:  + norovirus 12/28 Cdiff negative   Thank you for allowing pharmacy to be a part of this patient's care.  Angelique Blonder, PharmD Clinical  Pharmacist 09/18/2023 3:55 PM

## 2023-09-18 NOTE — Progress Notes (Signed)
PROGRESS NOTE    [name]   ZOX:[id number] DOB: 1946/01/31  DOA: 09/17/2023 Date of Service: 09/18/23 which is hospital day 1  PCP: Marguarite Arbour, MD    HPI: [name] is a 77 year old male with history of cholecystectomy at [facility] 03/2023. This was complicated by intra-abdominal abscess requiring 2 drains. He says that those drains were removed in 04/2023 and he has not returned to follow-up with his surgeon. Chronic problems include history of hypertension, paroxysmal atrial fibrillation, heart failure reduced ejection fraction, chronic hepatitis C, GERD. He presents emergency department for chief concerns of generalized weakness, worsening leg swelling and wound infection. Weak x4 days, unable to walk, weight gain, naysea, dysuria.   Hospital course / significant events:  12/27: admitted to hospitalist for severe sepsis d/t cellulitis at site of prior abdominal drain, intraabdominal abscess, acute on chronic HFrEF. General surgery consulted. IR felt situation would not benefit from drain placement. Broad spectrum abx cefepime, metronidazole, vancomycin pending cultures.  12/28: general surgery suspects fistula given drainage, continuing abx for now no plans for OR, recs follow w/ surgery team at [facility] when able to be d/c from hospital. Plan continue abx and await cultures   Consultants:  General surgery  Informal IR consult in ED   Procedures/Surgeries: none      ASSESSMENT & PLAN:   Severe sepsis with acute organ dysfunction  Postprocedural intraabdominal abscess  Cellulitis at site of prior abdominal drain  Question Enterocutaneous fistula  Elevated lactic acid, leukocytosis, acute kidney injury, source of infection intra-abdominal abscess Sepsis parameters improving as of today 12/28 - WBC and lactate down-trend, AKI resolved  Continue broad spectrum abx - today 09/18/23 is Day 2 cefepime, metronidazole, vancomycin IV fluids while NPO  Maintain MAP greater than  65 IR has advised against drain placement  General surgery following, recs to address abscess nonoperatively for now and plan for discharge to follow w/ [facility] surgery team that performed the original chole/drains/etc  Follow BCx Monitor sepsis parameters  if worsening would need to reengage w/ surgery here / possible transfer to [facility]    Paroxysmal A-fib  Home Eliquis not resumed on admission given likely pending surgery/procedure resume home Eliquis when the benefits outweigh the risk   AKI (acute kidney injury) - resolved D/t severe sepsis, exacerbation HFrEF Strict I's and O's Monitor BMP    Diarrhea CDiff screen   History HFrEF, question exacerbation SOB / weight gain symptoms felt more likely d/t sepsis issues as noted above Strict I's and O Complete echo ordered admission No IV diuresis as patient had low blood pressure to low normotensive on admission and meeting severe sepsis criteria Home Entresto daily (patient no longer taking twice daily low blood pressure) not resumed on admission due to low blood pressure and AKI Patient reports he is no longer taking metoprolol tartrate 25 mg twice daily and he takes Lasix as needed, either way would not resume beta blocker pending improvement BP/sepsis   GERD Continue home PPI      DVT prophylaxis: SCD IV fluids: d/c Nutrition: cardiac diet  Central lines / invasive devices: none  Code Status: FULL CODE ACP documentation reviewed:  none on file in [facility]  TOC needs: TBD Barriers to dispo / significant pending items: pending cultures, expect patient will be here several days total, if worsening would need to reengage w/ surgery here / possible transfer to [facility]              Subjective / Brief  ROS: Patient reports doing okay this morning, not eating much, didn't sleep well  Denies CP/SOB.  Pain controlled.  Denies new weakness.  Tolerating diet.  Reports no concerns w/ urination/defecation.   Family  Communication: none at this ttime     Objective Findings:  Vitals:   09/18/23 0500 09/18/23 0845 09/18/23 0900 09/18/23 1000  BP: 116/64  103/65 113/75  Pulse: 92   90  Resp: 20 13  18   Temp: (!) 97.5 F (36.4 C)     TempSrc: Oral     SpO2: 98%   98%  Weight:      Height:        Intake/Output Summary (Last 24 hours) at 09/18/2023 1321 Last data filed at 09/18/2023 1610 Gross per 24 hour  Intake 200 ml  Output --  Net 200 ml   Filed Weights   09/17/23 1103  Weight: 73.5 kg    Examination:  Physical Exam Constitutional:      General: He is not in acute distress. Cardiovascular:     Rate and Rhythm: Normal rate and regular rhythm.     Heart sounds: Normal heart sounds.  Pulmonary:     Effort: Pulmonary effort is normal.     Breath sounds: Normal breath sounds.  Abdominal:     Palpations: Abdomen is soft.  Musculoskeletal:     Right lower leg: No edema.     Left lower leg: No edema.  Skin:    General: Skin is warm and dry.  Neurological:     General: No focal deficit present.     Mental Status: He is alert and oriented to person, place, and time.  Psychiatric:        Mood and Affect: Mood normal.        Behavior: Behavior normal.          Scheduled Medications:   cyanocobalamin  1,000 mcg Oral Daily   pantoprazole  40 mg Oral Daily    Continuous Infusions:  ceFEPime (MAXIPIME) IV Stopped (09/18/23 0403)   metronidazole Stopped (09/18/23 0609)   vancomycin      PRN Medications:  acetaminophen **OR** acetaminophen, ondansetron **OR** ondansetron (ZOFRAN) IV, simethicone  Antimicrobials from admission:  Anti-infectives (From admission, onward)    Start     Dose/Rate Route Frequency Ordered Stop   09/18/23 1800  vancomycin (VANCOCIN) IVPB 1000 mg/200 mL premix       Placed in "Followed by" Linked Group   1,000 mg 200 mL/hr over 60 Minutes Intravenous Every 24 hours 09/17/23 1724 09/25/23 1759   09/18/23 0200  ceFEPIme (MAXIPIME) 2 g in  sodium chloride 0.9 % 100 mL IVPB        2 g 200 mL/hr over 30 Minutes Intravenous Every 12 hours 09/17/23 1724 09/25/23 0159   09/18/23 0200  metroNIDAZOLE (FLAGYL) IVPB 500 mg        500 mg 100 mL/hr over 60 Minutes Intravenous Every 12 hours 09/17/23 1755 09/25/23 0159   09/17/23 1730  metroNIDAZOLE (FLAGYL) IVPB 500 mg  Status:  Discontinued        500 mg 100 mL/hr over 60 Minutes Intravenous Every 12 hours 09/17/23 1716 09/17/23 1755   09/17/23 1730  vancomycin (VANCOREADY) IVPB 750 mg/150 mL       Placed in "Followed by" Linked Group   750 mg 150 mL/hr over 60 Minutes Intravenous  Once 09/17/23 1724 09/17/23 2029   09/17/23 1315  ceFEPIme (MAXIPIME) 2 g in sodium chloride 0.9 % 100 mL  IVPB        2 g 200 mL/hr over 30 Minutes Intravenous  Once 09/17/23 1308 09/17/23 1431   09/17/23 1315  metroNIDAZOLE (FLAGYL) IVPB 500 mg        500 mg 100 mL/hr over 60 Minutes Intravenous  Once 09/17/23 1308 09/17/23 1541   09/17/23 1315  vancomycin (VANCOCIN) IVPB 1000 mg/200 mL premix        1,000 mg 200 mL/hr over 60 Minutes Intravenous  Once 09/17/23 1308 09/17/23 1651           Data Reviewed:  I have personally reviewed the following...  CBC: Recent Labs  Lab 09/17/23 1107 09/18/23 0621  WBC 12.2* 9.7  HGB 12.5* 11.9*  HCT 39.2 37.0*  MCV 89.3 88.5  PLT 319 284   Basic Metabolic Panel: Recent Labs  Lab 09/17/23 1137 09/18/23 0621  NA 134* 135  K 3.1* 3.3*  CL 97* 102  CO2 22 23  GLUCOSE 86 91  BUN 21 15  CREATININE 1.54* 1.10  CALCIUM 7.8* 7.4*   GFR: Estimated Creatinine Clearance: 56.1 mL/min (by C-G formula based on SCr of 1.1 mg/dL). Liver Function Tests: Recent Labs  Lab 09/17/23 1137  AST 30  ALT 13  ALKPHOS 101  BILITOT 0.6  PROT 5.9*  ALBUMIN 1.9*   No results for input(s): "LIPASE", "AMYLASE" in the last 168 hours. No results for input(s): "AMMONIA" in the last 168 hours. Coagulation Profile: Recent Labs  Lab 09/17/23 1355  INR 1.3*    Cardiac Enzymes: No results for input(s): "CKTOTAL", "CKMB", "CKMBINDEX", "TROPONINI" in the last 168 hours. BNP (last 3 results) No results for input(s): "PROBNP" in the last 8760 hours. HbA1C: No results for input(s): "HGBA1C" in the last 72 hours. CBG: No results for input(s): "GLUCAP" in the last 168 hours. Lipid Profile: No results for input(s): "CHOL", "HDL", "LDLCALC", "TRIG", "CHOLHDL", "LDLDIRECT" in the last 72 hours. Thyroid Function Tests: No results for input(s): "TSH", "T4TOTAL", "FREET4", "T3FREE", "THYROIDAB" in the last 72 hours. Anemia Panel: No results for input(s): "VITAMINB12", "FOLATE", "FERRITIN", "TIBC", "IRON", "RETICCTPCT" in the last 72 hours. Most Recent Urinalysis On File:     Component Value Date/Time   COLORURINE YELLOW (A) 09/17/2023 1545   APPEARANCEUR CLEAR (A) 09/17/2023 1545   LABSPEC 1.005 09/17/2023 1545   PHURINE 6.0 09/17/2023 1545   GLUCOSEU NEGATIVE 09/17/2023 1545   HGBUR NEGATIVE 09/17/2023 1545   BILIRUBINUR NEGATIVE 09/17/2023 1545   KETONESUR NEGATIVE 09/17/2023 1545   PROTEINUR NEGATIVE 09/17/2023 1545   NITRITE NEGATIVE 09/17/2023 1545   LEUKOCYTESUR NEGATIVE 09/17/2023 1545   Sepsis Labs: @LABRCNTIP (procalcitonin:4,lacticidven:4) Microbiology: Recent Results (from the past 240 hours)  Culture, blood (routine x 2)     Status: None (Preliminary result)   Collection Time: 09/17/23 11:38 AM   Specimen: BLOOD  Result Value Ref Range Status   Specimen Description BLOOD BLOOD RIGHT FOREARM  Final   Special Requests   Final    BOTTLES DRAWN AEROBIC AND ANAEROBIC Blood Culture adequate volume   Culture   Final    NO GROWTH < 24 HOURS Performed at [facility], [address] [place], [place] [address]    Report Status PENDING  Incomplete  Culture, blood (routine x 2)     Status: None (Preliminary result)   Collection Time: 09/17/23 11:51 AM   Specimen: BLOOD  Result Value Ref Range Status   Specimen Description  BLOOD BLOOD RIGHT HAND  Final   Special Requests   Final  BOTTLES DRAWN AEROBIC ONLY Blood Culture adequate volume   Culture   Final    NO GROWTH < 24 HOURS Performed at [facility], [address] [place], [place] [address]    Report Status PENDING  Incomplete  C Difficile Quick Screen w PCR reflex     Status: None   Collection Time: 09/18/23  9:17 AM   Specimen: STOOL  Result Value Ref Range Status   C Diff antigen NEGATIVE NEGATIVE Final   C Diff toxin NEGATIVE NEGATIVE Final   C Diff interpretation No C. difficile detected.  Final    Comment: Performed at [facility], [address] [place], [place] [address]  Gastrointestinal Panel by PCR , Stool     Status: Abnormal   Collection Time: 09/18/23  9:18 AM   Specimen: STOOL  Result Value Ref Range Status   Campylobacter species NOT DETECTED NOT DETECTED Final   Plesimonas shigelloides NOT DETECTED NOT DETECTED Final   Salmonella species NOT DETECTED NOT DETECTED Final   Yersinia enterocolitica NOT DETECTED NOT DETECTED Final   Vibrio species NOT DETECTED NOT DETECTED Final   Vibrio cholerae NOT DETECTED NOT DETECTED Final   Enteroaggregative E coli (EAEC) NOT DETECTED NOT DETECTED Final   Enteropathogenic E coli (EPEC) NOT DETECTED NOT DETECTED Final   Enterotoxigenic E coli (ETEC) NOT DETECTED NOT DETECTED Final   Shiga like toxin producing E coli (STEC) NOT DETECTED NOT DETECTED Final   Shigella/Enteroinvasive E coli (EIEC) NOT DETECTED NOT DETECTED Final   Cryptosporidium NOT DETECTED NOT DETECTED Final   Cyclospora cayetanensis NOT DETECTED NOT DETECTED Final   Entamoeba histolytica NOT DETECTED NOT DETECTED Final   Giardia lamblia NOT DETECTED NOT DETECTED Final   Adenovirus F40/41 NOT DETECTED NOT DETECTED Final   Astrovirus NOT DETECTED NOT DETECTED Final   Norovirus GI/GII DETECTED (A) NOT DETECTED Final    Comment: RESULT CALLED TO, READ BACK BY AND VERIFIED WITH: [place] GROSE AT 1116  09/18/23.PMF    Rotavirus A NOT DETECTED NOT DETECTED Final   Sapovirus (I, II, IV, and V) NOT DETECTED NOT DETECTED Final    Comment: Performed at [facility], [address], [place], [place] [address]      Radiology Studies last 3 days: CT CHEST ABDOMEN PELVIS WO CONTRAST Result Date: 09/17/2023 CLINICAL DATA:  Recurrent surgical site infection status post cholecystectomy EXAM: CT CHEST, ABDOMEN AND PELVIS WITHOUT CONTRAST TECHNIQUE: Multidetector CT imaging of the chest, abdomen and pelvis was performed following the standard protocol without IV contrast. RADIATION DOSE REDUCTION: This exam was performed according to the departmental dose-optimization program which includes automated exposure control, adjustment of the mA and/or kV according to patient size and/or use of iterative reconstruction technique. COMPARISON:  CT abdomen and pelvis dated 05/05/2023 FINDINGS: CT CHEST FINDINGS Cardiovascular: Normal heart size. No significant pericardial fluid/thickening. Ascending thoracic aorta measures 4.1 x 3.7 cm. No central pulmonary emboli. Coronary artery calcifications. Mediastinum/Nodes: Imaged thyroid gland without nodules meeting criteria for imaging follow-up by size. Normal esophagus. No pathologically enlarged axillary, supraclavicular, mediastinal, or hilar lymph nodes. Lungs/Pleura: The central airways are patent. Moderate upper lobe predominant centrilobular and paraseptal emphysema. No focal consolidation. No pneumothorax. Trace right pleural effusion. Musculoskeletal: No acute or abnormal lytic or blastic osseous lesions. Multilevel degenerative changes of the thoracic spine. CT ABDOMEN PELVIS FINDINGS Hepatobiliary: Diffuse parenchymal hypoattenuation can be seen with hepatic steatosis. Persistent pneumobilia. No intra or extrahepatic biliary ductal dilation. Cholecystectomy Pancreas: Persistent pancreatic ductal dilation measuring up  to 10 mm to the level of an intraductal  stone in the pancreatic head. Pancreatic parenchymal atrophy in keeping with sequela of prior chronic pancreatitis. Spleen: Normal in size without focal abnormality. Adrenals/Urinary Tract: 14 mm right adrenal nodule (2:58), previously characterized as an adenoma measures 2 HU. No specific follow-up imaging recommended. No left adrenal nodule. No suspicious renal mass on this noncontrast enhanced examination, calculi, or hydronephrosis. No focal bladder wall thickening. Stomach/Bowel: Normal appearance of the stomach. No evidence of bowel wall thickening, distention, or inflammatory changes. Normal appendix. Vascular/Lymphatic: Aortic atherosclerosis. No enlarged abdominal or pelvic lymph nodes. Reproductive: Prostate is unremarkable. Other: No free air or fluid collection. Interval removal of right lateral approach drainage catheter, previously located in the right paracolic gutter. A small linear density at the catheter tract site remains (2:71). Persistent crescentic fluid collection extending inferiorly along the right paracolic gutter from this area measures 8.0 x 1.7 cm (2:77) previously 5.2 x 0.9 cm when measured similarly. Musculoskeletal: No acute or abnormal lytic or blastic osseous findings. Multilevel degenerative changes of the lumbar spine. Increased subcutaneous soft tissue edema of the right flank. IMPRESSION: 1. Interval removal of right lateral approach drainage catheter, previously located in the right paracolic gutter. Persistent crescentic fluid collection extending inferiorly along the right paracolic gutter from this area measures 8.0 x 1.7 cm, previously 5.2 x 0.9 cm when measured similarly. 2. Increased subcutaneous soft tissue edema of the right flank. 3. Trace right pleural effusion. 4. Aortic Atherosclerosis (ICD10-I70.0) and Emphysema (ICD10-J43.9). Coronary artery calcifications. Assessment for potential risk factor modification, dietary therapy or pharmacologic therapy may be  warranted, if clinically indicated. 5. Ascending thoracic aorta measures 4.1 cm. Recommend annual imaging followup by CTA or MRA. This recommendation follows 2010 ACCF/AHA/AATS/ACR/ASA/SCA/SCAI/SIR/STS/SVM Guidelines for the Diagnosis and Management of Patients with Thoracic Aortic Disease. Circulation. 2010; 121: E952-W413. Aortic aneurysm NOS (ICD10-I71.9) Electronically Signed   By: Agustin Cree M.D.   On: 09/17/2023 15:51         Sunnie Nielsen, DO [facility] 09/18/2023, 1:21 PM    Dictation software may have been used to generate the above note. Typos may occur and escape review in typed/dictated notes. Please contact Dr Lyn Hollingshead directly for clarity if needed.  Staff may message me via secure chat in Epic  but this may not receive an immediate response,  please page me for urgent matters!  If 7PM-7AM, please contact night coverage [email or web address]

## 2023-09-18 NOTE — Consult Note (Signed)
Patient ID: Karl Norman, male   DOB: [birth year], 77 y.o.   MRN: [id number] CC: Intra-abdominal abscess/purulent drainge History of Present Illness Karl Norman is a 76 y.o. male with past medical history as listed below who presents in consultation for intra-abdominal abscess and subcutaneous drainage of purulence.  The patient reports that in July he underwent a cholecystectomy at [facility].  This was complicated by intra-abdominal abscess requiring 2 drains.  He says that those drains were removed in August and he has not returned to follow-up with his surgeon.  He said that over the last 4 days he has increasing weakness that has been worsened by bilateral leg swelling.  He has been trying to take his home Lasix to decrease the leg swelling.  He also reports that he has not had a good appetite.  Although he also says he gained 60 pounds since Sunday.  The patient also says that he is having some diarrhea.  He denies any fevers or chills.  He says that he has had continued drainage from his previous drain site over the last several days.  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   Pancreatitis    Pneumatouria        Past Surgical History:  Procedure Laterality Date   CARDIOVERSION N/A 10/24/2020   Procedure: CARDIOVERSION;  Surgeon: Lamar Blinks, MD;  Location: [facility] ORS;  Service: Cardiovascular;  Laterality: N/A;   COLONOSCOPY WITH PROPOFOL N/A 08/15/2018   Procedure: COLONOSCOPY WITH PROPOFOL;  Surgeon: Scot Jun, MD;  Location: [facility]  ENDOSCOPY;  Service: Endoscopy;  Laterality: N/A;   COLONOSCOPY WITH PROPOFOL N/A 01/20/2022   Procedure: COLONOSCOPY WITH PROPOFOL;  Surgeon: Regis Bill, MD;  Location: [facility] ENDOSCOPY;  Service: Endoscopy;  Laterality: N/A;   ERCP with stent  07/22/2018   ESOPHAGOGASTRODUODENOSCOPY     6 times   ESOPHAGOGASTRODUODENOSCOPY (EGD) WITH PROPOFOL N/A 01/20/2022   Procedure: ESOPHAGOGASTRODUODENOSCOPY (EGD) WITH PROPOFOL;  Surgeon: Regis Bill, MD;  Location: [facility] ENDOSCOPY;  Service: Endoscopy;  Laterality: N/A;   REPLACEMENT TOTAL KNEE     TEE WITHOUT CARDIOVERSION N/A 11/11/2015   Procedure: TRANSESOPHAGEAL ECHOCARDIOGRAM (TEE);  Surgeon: Lamar Blinks, MD;  Location: [facility] ORS;  Service: Cardiovascular;  Laterality: N/A;   TEE WITHOUT CARDIOVERSION N/A 10/24/2020   Procedure: TRANSESOPHAGEAL ECHOCARDIOGRAM (TEE);  Surgeon: Lamar Blinks, MD;  Location: [facility] ORS;  Service: Cardiovascular;  Laterality: N/A;    Allergies  Allergen Reactions   Penicillins     Has patient had a PCN reaction causing immediate rash, facial/tongue/throat swelling, SOB or lightheadedness with hypotension: NO Has patient had a PCN reaction causing severe rash involving mucus membranes or skin necrosis: YES Has patient had a PCN reaction that required hospitalization: YES Has patient had a PCN reaction occurring within the last 10 years: NO If all of the above answers are "NO", then may proceed with Cephalosporin use.     Current Facility-Administered Medications  Medication Dose Route Frequency Provider  Last Rate Last Admin   acetaminophen (TYLENOL) tablet 650 mg  650 mg Oral Q6H PRN Cox, Amy N, DO       Or   acetaminophen (TYLENOL) suppository 650 mg  650 mg Rectal Q6H PRN Cox, Amy N, DO       ceFEPIme (MAXIPIME) 2 g in sodium chloride 0.9 % 100 mL IVPB  2 g Intravenous Q12H Cox, Amy N, DO   Stopped at 09/18/23 0403   cyanocobalamin (VITAMIN B12) tablet 1,000 mcg  1,000 mcg Oral Daily Cox,  Amy N, DO   1,000 mcg at 09/18/23 4098   metroNIDAZOLE (FLAGYL) IVPB 500 mg  500 mg Intravenous Q12H Cox, Amy N, DO   Stopped at 09/18/23 0609   ondansetron (ZOFRAN) tablet 4 mg  4 mg Oral Q6H PRN Cox, Amy N, DO       Or   ondansetron (ZOFRAN) injection 4 mg  4 mg Intravenous Q6H PRN Cox, Amy N, DO       pantoprazole (PROTONIX) EC tablet 40 mg  40 mg Oral Daily Cox, Amy N, DO   40 mg at 09/18/23 0914   simethicone (MYLICON) chewable tablet 80 mg  80 mg Oral Q6H PRN Cox, Amy N, DO       vancomycin (VANCOCIN) IVPB 1000 mg/200 mL premix  1,000 mg Intravenous Q24H Cox, Amy N, DO       Current Outpatient Medications  Medication Sig Dispense Refill   apixaban (ELIQUIS) 5 MG TABS tablet Take 1 tablet (5 mg total) by mouth 2 (two) times daily. 60 tablet 3   furosemide (LASIX) 40 MG tablet Take 1 tablet (40 mg total) by mouth daily. (Patient taking differently: Take 40 mg by mouth daily as needed for fluid.) 30 tablet 1   loperamide (IMODIUM) 2 MG capsule Take 4 mg by mouth 2 (two) times daily.     metoprolol tartrate (LOPRESSOR) 25 MG tablet Take 1 tablet (25 mg total) by mouth 2 (two) times daily. 60 tablet 1   omeprazole (PRILOSEC) 20 MG capsule Take 20 mg by mouth daily.     sacubitril-valsartan (ENTRESTO) 24-26 MG Take 1 tablet by mouth 2 (two) times daily. 60 tablet 3   simethicone (MYLICON) 80 MG chewable tablet Chew 80 mg by mouth every 6 (six) hours as needed for flatulence.     b complex vitamins capsule Take 1 capsule by mouth daily. (Patient not taking: Reported on 09/17/2023)     ferrous sulfate 324 MG TBEC Take 324 mg by mouth daily as needed. (Patient not taking: Reported on 09/17/2023)     ibuprofen (ADVIL) 200 MG tablet Take 400 mg by mouth every 6 (six) hours as needed for mild pain. (Patient not taking: Reported on 09/17/2023)     vitamin B-12 (CYANOCOBALAMIN) 1000 MCG tablet Take 1,000 mcg by mouth daily. (Patient not taking: Reported on 09/17/2023)      Family History Family  History  Problem Relation Age of Onset   Heart failure Father    Leukemia Brother        Social History Social History   Tobacco Use   Smoking status: Former   Smokeless tobacco: Never  Advertising account planner   Vaping status: Never Used  Substance Use Topics   Alcohol use: Yes    Comment: occasionally uses all 3   Drug use: Not Currently        ROS Full ROS of systems performed and is otherwise negative there than what is stated in the HPI  Physical Exam Blood pressure 113/75, pulse 90, temperature (!) 97.5 F (36.4 C), temperature source Oral, resp. rate 18, height 5\' 8"  (1.727 m), weight 73.5 kg, SpO2 98%.  Alert and oriented x 3, normal work of breathing on room air, regular rate and rhythm, abdomen is soft, nontender to palpation.  In the right upper quadrant there is a wound with surrounding cellulitis.  This wound is at the site of previous drain placement.  There is purulence that is draining from this wound.  It is quite erythematous around this opening.  Data Reviewed His labs were notable for initially notable lactic acidosis that improved with resuscitation.  He also does not have a leukocytosis.  I reviewed his CT scan.  There is some free fluid in the abdomen and there looks like some inflammatory changes in the subcutaneous tissue along the tract of the former drain that was placed.  I have personally reviewed the patient's imaging and medical records.    Assessment    Mr. Ligocki is a 77 year old who had a cholecystectomy in July at [facility] that was complicated by intra-abdominal abscess requiring 2 drain placements.  He now returns with purulent drainage from one of the sites as well as weakness.  I have reached out to IR but they were unable to see any benefit in draining the fluid.  They believe that this is secondary to where the previous drain was.  He is draining purulent fluid so may be developing some sort of fistula given the chronicity of this drainage but he  does not appear to be septic at this time.  Recommend broad-spectrum antibiotics and treatment for the surrounding cellulitis.  Continue wound management to the drain site that is producing purulent fluid.  I discussed with him that he should follow-up with his surgeon at [facility] when he is able to be discharged from the hospital.   Kandis Cocking 09/18/2023, 12:09 PM

## 2023-09-19 ENCOUNTER — Inpatient Hospital Stay
Admit: 2023-09-19 | Discharge: 2023-09-19 | Disposition: A | Discharge status: Home / Self Care | Payer: Medicare Other | Attending: Internal Medicine | Admitting: Internal Medicine

## 2023-09-19 DIAGNOSIS — A419 Sepsis, unspecified organism: Secondary | ICD-10-CM | POA: Diagnosis not present

## 2023-09-19 DIAGNOSIS — R6520 Severe sepsis without septic shock: Secondary | ICD-10-CM | POA: Diagnosis not present

## 2023-09-19 DIAGNOSIS — L02211 Cutaneous abscess of abdominal wall: Secondary | ICD-10-CM | POA: Diagnosis not present

## 2023-09-19 DIAGNOSIS — R0600 Dyspnea, unspecified: Secondary | ICD-10-CM | POA: Diagnosis not present

## 2023-09-19 LAB — CBC
HCT: 33.5 % — ABNORMAL LOW (ref 39.0–52.0)
Hemoglobin: 10.7 g/dL — ABNORMAL LOW (ref 13.0–17.0)
MCH: 28.5 pg (ref 26.0–34.0)
MCHC: 31.9 g/dL (ref 30.0–36.0)
MCV: 89.3 fL (ref 80.0–100.0)
Platelets: 259 10*3/uL (ref 150–400)
RBC: 3.75 MIL/uL — ABNORMAL LOW (ref 4.22–5.81)
RDW: 16.6 % — ABNORMAL HIGH (ref 11.5–15.5)
WBC: 9.1 10*3/uL (ref 4.0–10.5)
nRBC: 0 % (ref 0.0–0.2)

## 2023-09-19 LAB — BASIC METABOLIC PANEL
Anion gap: 8 (ref 5–15)
BUN: 11 mg/dL (ref 8–23)
CO2: 20 mmol/L — ABNORMAL LOW (ref 22–32)
Calcium: 6.9 mg/dL — ABNORMAL LOW (ref 8.9–10.3)
Chloride: 108 mmol/L (ref 98–111)
Creatinine, Ser: 0.81 mg/dL (ref 0.61–1.24)
GFR, Estimated: >60 mL/min (ref >60)
Glucose, Bld: 82 mg/dL (ref 70–99)
Potassium: 4.5 mmol/L (ref 3.5–5.1)
Sodium: 136 mmol/L (ref 135–145)

## 2023-09-19 LAB — ECHOCARDIOGRAM COMPLETE
AR max vel: 2.25 cm2
AV Area VTI: 2.11 cm2
AV Area mean vel: 2.14 cm2
AV Mean grad: 4 mm[Hg]
AV Peak grad: 7.7 mm[Hg]
Ao pk vel: 1.39 m/s
Area-P 1/2: 2.91 cm2
Height: 68 in
MV VTI: 2.09 cm2
P 1/2 time: 499 ms
S' Lateral: 3.2 cm
Weight: 2592 [oz_av]

## 2023-09-19 MED ORDER — METOPROLOL TARTRATE 25 MG PO TABS: 12.5000 mg | ORAL_TABLET | Freq: Two times a day (BID) | ORAL | Status: DC

## 2023-09-19 MED ORDER — SODIUM CHLORIDE 0.9 % IV SOLN: 2.0000 g | Freq: Three times a day (TID) | INTRAVENOUS | Status: DC

## 2023-09-19 MED ORDER — FUROSEMIDE 40 MG PO TABS: ORAL_TABLET | ORAL | Status: DC

## 2023-09-19 MED ORDER — SULFAMETHOXAZOLE-TRIMETHOPRIM 800-160 MG PO TABS: 1.0000 | ORAL_TABLET | Freq: Two times a day (BID) | ORAL | 0 refills | Status: DC

## 2023-09-19 MED ORDER — METRONIDAZOLE 500 MG PO TABS: 500.0000 mg | ORAL_TABLET | Freq: Three times a day (TID) | ORAL | 0 refills | Status: AC

## 2023-09-19 MED ORDER — CHLORHEXIDINE GLUCONATE 4 % EX SOLN: Freq: Every day | CUTANEOUS | 0 refills | Status: DC | PRN

## 2023-09-19 MED ORDER — VANCOMYCIN HCL 1750 MG/350ML IV SOLN
1750.0000 mg | INTRAVENOUS | Status: DC
  Filled 2023-09-19: qty 350

## 2023-09-19 NOTE — Progress Notes (Signed)
Physical Therapy Evaluation Patient Details Name: Karl Norman MRN: [id number] DOB: Dec 04, 1945 Today's Date: 09/19/2023  History of Present Illness  Pt is a 77 y/o male admitted secondary to weight gain, bilateral LE swelling and wound infection. 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 Surgery team and IR consulted with no surgical interventions recommended at this time.  Instead they recommended broad-spectrum antibiotics and treatment for the surrounding cellulitis, and follow-up with original surgeon at [facility]. PMH including but not limited to recurrent choledocholithiasis, with history of gallstone pancreatitis status post ERCP with complicated intra-abdominal abscess requiring multiple drain placement, history of hypertension, paroxysmal atrial fibrillation, heart failure reduced ejection fraction, chronic hepatitis C, GERD.   Clinical Impression  Pt presented supine in bed with HOB elevated, awake and willing to participate in therapy session. Pt's spouse and son present throughout. Prior to admission, pt reported that he was able to ambulate with use of a cane and was independent with ADLs. Pt lives with his wife in a single level home with four steps to enter (with rails). At the time of evaluation, pt able to complete bed mobility with supervision, transfers with CGA and ambulated a very short distance with use of RW and CGA for safety. PT recommending HHPT services upon d/c from hospital with pt and family agreeable. Pt would continue to benefit from skilled physical therapy services at this time while admitted and after d/c to address the below listed limitations in order to improve overall safety and independence with functional mobility.  Of note, pt's HR very erratic throughout with movement (110's-150's) with pt reporting a-fib at baseline and not being given his usual a-fib  medications while admitted.       If plan is discharge home, recommend the following: A little help with walking and/or transfers;A little help with bathing/dressing/bathroom;Assistance with cooking/housework;Assist for transportation;Help with stairs or ramp for entrance   Can travel by private vehicle        Equipment Recommendations None recommended by PT  Recommendations for Other Services       Functional Status Assessment Patient has had a recent decline in their functional status and demonstrates the ability to make significant improvements in function in a reasonable and predictable amount of time.     Precautions / Restrictions Precautions Precautions: Fall Restrictions Weight Bearing Restrictions Per Provider Order: No      Mobility  Bed Mobility Overal bed mobility: Needs Assistance Bed Mobility: Supine to Sit     Supine to sit: Supervision     General bed mobility comments: increased time and effort needed    Transfers Overall transfer level: Needs assistance Equipment used: Rolling walker (2 wheels) Transfers: Sit to/from Stand Sit to Stand: Contact guard assist           General transfer comment: increased time and effort, steady with transitional movement, CGA for safety    Ambulation/Gait Ambulation/Gait assistance: Contact guard assist   Assistive device: Rolling walker (2 wheels) Gait Pattern/deviations: Step-to pattern, Step-through pattern, Decreased stride length Gait velocity: decreased     General Gait Details: pt able to take 4-5 steps forwards and backwards, 2 steps laterally at EOB with RW and CGA for safety  Stairs            Wheelchair Mobility     Tilt Bed    Modified Rankin (Stroke Patients Only)       Balance Overall balance assessment:  Needs assistance Sitting-balance support: Feet supported Sitting balance-Leahy Scale: Good     Standing balance support: During functional activity, Single extremity  supported, Bilateral upper extremity supported Standing balance-Leahy Scale: Poor                               Pertinent Vitals/Pain Pain Assessment Pain Assessment: No/denies pain    Home Living Family/patient expects to be discharged to:: Private residence Living Arrangements: Spouse/significant other Available Help at Discharge: Family Type of Home: House Home Access: Stairs to enter Entrance Stairs-Rails: Doctor, general practice of Steps: 4   Home Layout: One level Home Equipment: Agricultural consultant (2 wheels);Cane - single point;Crutches;BSC/3in1;Shower seat      Prior Function Prior Level of Function : Independent/Modified Independent             Mobility Comments: ambulates with use of cane ADLs Comments: independent     Extremity/Trunk Assessment   Upper Extremity Assessment Upper Extremity Assessment: Overall WFL for tasks assessed    Lower Extremity Assessment Lower Extremity Assessment: Generalized weakness       Communication   Communication Communication: No apparent difficulties  Cognition Arousal: Alert Behavior During Therapy: WFL for tasks assessed/performed Overall Cognitive Status: Within Functional Limits for tasks assessed                                          General Comments      Exercises     Assessment/Plan    PT Assessment Patient needs continued PT services  PT Problem List Decreased strength;Decreased range of motion;Decreased activity tolerance;Decreased balance;Decreased coordination;Decreased mobility;Decreased knowledge of use of DME;Decreased safety awareness;Decreased knowledge of precautions       PT Treatment Interventions DME instruction;Stair training;Gait training;Functional mobility training;Therapeutic activities;Therapeutic exercise;Balance training;Neuromuscular re-education;Patient/family education    PT Goals (Current goals can be found in the Care Plan section)   Acute Rehab PT Goals Patient Stated Goal: return home today PT Goal Formulation: With patient/family Time For Goal Achievement: 10/03/23 Potential to Achieve Goals: Good    Frequency Min 1X/week     Co-evaluation               AM-PAC PT "6 Clicks" Mobility  Outcome Measure Help needed turning from your back to your side while in a flat bed without using bedrails?: None Help needed moving from lying on your back to sitting on the side of a flat bed without using bedrails?: A Little Help needed moving to and from a bed to a chair (including a wheelchair)?: A Little Help needed standing up from a chair using your arms (e.g., wheelchair or bedside chair)?: A Little Help needed to walk in hospital room?: A Little Help needed climbing 3-5 steps with a railing? : A Little 6 Click Score: 19    End of Session   Activity Tolerance: Patient tolerated treatment well Patient left: in bed;with call bell/phone within reach;with family/visitor present;Other (comment) (family present assisting pt with getting dressed in preparation for d/c home today) Nurse Communication: Mobility status PT Visit Diagnosis: Other abnormalities of gait and mobility (R26.89)    Time: 1400-1420 PT Time Calculation (min) (ACUTE ONLY): 20 min   Charges:   PT Evaluation $PT Eval Moderate Complexity: 1 Mod   PT General Charges $$ ACUTE PT VISIT: 1 Visit  Arletta Bale, DPT  Acute Rehabilitation Services Office [phone]   Alessandra Bevels Roiza Wiedel 09/19/2023, 2:32 PM

## 2023-09-19 NOTE — Discharge Instructions (Addendum)
[facility]  [address]  [address] [place], [place] [address]  [phone]

## 2023-09-19 NOTE — Progress Notes (Signed)
CC: ABd abacess Subjective: Doing well, still draining  The patient reports that in July he underwent a cholecystectomy at [facility]. This was complicated by intra-abdominal abscess requiring 2 drains.  CT scan personally review showing a right paracolic collection.  Objective: Vital signs in last 24 hours: Temp:  [97.8 F (36.6 C)-98.2 F (36.8 C)] 97.9 F (36.6 C) (12/29 1027) Pulse Rate:  [61-114] 72 (12/29 1506) Resp:  [13-20] 16 (12/29 1506) BP: (85-133)/(56-83) 85/74 (12/29 1506) SpO2:  [93 %-100 %] 93 % (12/29 1506) Last BM Date : 09/19/23  Intake/Output from previous day: 12/28 0701 - 12/29 0700 In: 250 [IV Piggyback:250] Out: -  Intake/Output this shift: No intake/output data recorded.  Physical exam: Chronically ill malnourished and debilitated but in no acute distress Chest : CTA, NSR Abd: soft, Right flank wound w dressing in place and some erythema, opening w purulent drainage. No obvious drain able collection. No peritonitis  Lab Results: CBC  Recent Labs    09/18/23 0621 09/19/23 0549  WBC 9.7 9.1  HGB 11.9* 10.7*  HCT 37.0* 33.5*  PLT 284 259   BMET Recent Labs    09/18/23 0621 09/19/23 0549  NA 135 136  K 3.3* 4.5  CL 102 108  CO2 23 20*  GLUCOSE 91 82  BUN 15 11  CREATININE 1.10 0.81  CALCIUM 7.4* 6.9*   PT/INR Recent Labs    09/17/23 1355  LABPROT 16.7*  INR 1.3*   ABG No results for input(s): "PHART", "HCO3" in the last 72 hours.  Invalid input(s): "PCO2", "PO2"  Studies/Results: ECHOCARDIOGRAM COMPLETE Result Date: 09/19/2023    ECHOCARDIOGRAM REPORT   Patient Name:   Karl Norman Date of Exam: 09/19/2023 Medical Rec #:  [id number]    Height:       68.0 in Accession #:    [id number]   Weight:       162.0 lb Date of Birth:  03-Jan-1946   BSA:          1.869 m Patient Age:    77 years     BP:           70/46 mmHg Patient Gender: M            HR:           91 bpm. Exam Location:  [facility] Procedure: 2D Echo, Cardiac Doppler and Color  Doppler Indications:     Dyspnea R06.00  History:         Patient has prior history of Echocardiogram examinations. CHF.  Sonographer:     Neysa Bonito Roar Referring Phys:  [id number] AMY N COX Diagnosing Phys: Adrian Blackwater IMPRESSIONS  1. Left ventricular ejection fraction, by estimation, is 50 to 55%. The left ventricle has low normal function. The left ventricle has no regional wall motion abnormalities. There is moderate concentric left ventricular hypertrophy. Left ventricular diastolic parameters are consistent with Grade I diastolic dysfunction (impaired relaxation).  2. Right ventricular systolic function is normal. The right ventricular size is normal.  3. Left atrial size was mildly dilated.  4. Right atrial size was mildly dilated.  5. The mitral valve is normal in structure. Moderate mitral valve regurgitation. No evidence of mitral stenosis.  6. Tricuspid valve regurgitation is moderate to severe.  7. The aortic valve is normal in structure. Aortic valve regurgitation is moderate. Aortic valve sclerosis/calcification is present, without any evidence of aortic stenosis.  8. The inferior vena cava is normal in size with greater than 50% respiratory  variability, suggesting right atrial pressure of 3 mmHg. FINDINGS  Left Ventricle: Left ventricular ejection fraction, by estimation, is 50 to 55%. The left ventricle has low normal function. The left ventricle has no regional wall motion abnormalities. The left ventricular internal cavity size was normal in size. There is moderate concentric left ventricular hypertrophy. Left ventricular diastolic parameters are consistent with Grade I diastolic dysfunction (impaired relaxation). Right Ventricle: The right ventricular size is normal. No increase in right ventricular wall thickness. Right ventricular systolic function is normal. Left Atrium: Left atrial size was mildly dilated. Right Atrium: Right atrial size was mildly dilated. Pericardium: There is no evidence of  pericardial effusion. Mitral Valve: The mitral valve is normal in structure. Moderate mitral valve regurgitation. No evidence of mitral valve stenosis. MV peak gradient, 3.1 mmHg. The mean mitral valve gradient is 1.0 mmHg. Tricuspid Valve: The tricuspid valve is normal in structure. Tricuspid valve regurgitation is moderate to severe. No evidence of tricuspid stenosis. Aortic Valve: The aortic valve is normal in structure. Aortic valve regurgitation is moderate. Aortic regurgitation PHT measures 499 msec. Aortic valve sclerosis/calcification is present, without any evidence of aortic stenosis. Aortic valve mean gradient measures 4.0 mmHg. Aortic valve peak gradient measures 7.7 mmHg. Aortic valve area, by VTI measures 2.11 cm. Pulmonic Valve: The pulmonic valve was normal in structure. Pulmonic valve regurgitation is not visualized. No evidence of pulmonic stenosis. Aorta: The aortic root is normal in size and structure. Venous: The inferior vena cava is normal in size with greater than 50% respiratory variability, suggesting right atrial pressure of 3 mmHg. IAS/Shunts: No atrial level shunt detected by color flow Doppler.  LEFT VENTRICLE PLAX 2D LVIDd:         4.30 cm   Diastology LVIDs:         3.20 cm   LV e' medial:    7.29 cm/s LV PW:         0.90 cm   LV E/e' medial:  9.5 LV IVS:        1.20 cm   LV e' lateral:   12.30 cm/s LVOT diam:     1.80 cm   LV E/e' lateral: 5.6 LV SV:         48 LV SV Index:   26 LVOT Area:     2.54 cm  RIGHT VENTRICLE RV Basal diam:  3.30 cm RV Mid diam:    2.80 cm RV S prime:     11.00 cm/s TAPSE (M-mode): 1.8 cm LEFT ATRIUM             Index        RIGHT ATRIUM           Index LA diam:        3.60 cm 1.93 cm/m   RA Area:     18.90 cm LA Vol (A2C):   54.4 ml 29.11 ml/m  RA Volume:   51.10 ml  27.35 ml/m LA Vol (A4C):   47.2 ml 25.26 ml/m LA Biplane Vol: 51.2 ml 27.40 ml/m  AORTIC VALVE                    PULMONIC VALVE AV Area (Vmax):    2.25 cm     PV Vmax:        1.22 m/s  AV Area (Vmean):   2.14 cm     PV Peak grad:   6.0 mmHg AV Area (VTI):     2.11 cm  RVOT Peak grad: 5 mmHg AV Vmax:           139.00 cm/s AV Vmean:          92.400 cm/s AV VTI:            0.228 m AV Peak Grad:      7.7 mmHg AV Mean Grad:      4.0 mmHg LVOT Vmax:         123.00 cm/s LVOT Vmean:        77.700 cm/s LVOT VTI:          0.189 m LVOT/AV VTI ratio: 0.83 AI PHT:            499 msec  AORTA Ao Root diam: 2.90 cm Ao Asc diam:  3.60 cm MITRAL VALVE MV Area (PHT): 2.91 cm    SHUNTS MV Area VTI:   2.09 cm    Systemic VTI:  0.19 m MV Peak grad:  3.1 mmHg    Systemic Diam: 1.80 cm MV Mean grad:  1.0 mmHg MV Vmax:       0.89 m/s MV Vmean:      53.8 cm/s MV Decel Time: 261 msec MV E velocity: 69.40 cm/s MV A velocity: 95.30 cm/s MV E/A ratio:  0.73 MV A Prime:    10.3 cm/s Adrian Blackwater Electronically signed by Adrian Blackwater Signature Date/Time: 09/19/2023/12:30:49 PM    Final     Anti-infectives: Anti-infectives (From admission, onward)    Start     Dose/Rate Route Frequency Ordered Stop   09/19/23 1800  vancomycin (VANCOREADY) IVPB 1750 mg/350 mL       Placed in "Followed by" Linked Group   1,750 mg 175 mL/hr over 120 Minutes Intravenous Every 24 hours 09/19/23 1416 09/25/23 1759   09/19/23 1600  ceFEPIme (MAXIPIME) 2 g in sodium chloride 0.9 % 100 mL IVPB        2 g 200 mL/hr over 30 Minutes Intravenous Every 8 hours 09/19/23 1441 09/25/23 1559   09/19/23 0000  metroNIDAZOLE (FLAGYL) 500 MG tablet        500 mg Oral 3 times daily 09/19/23 1152 09/29/23 2359   09/19/23 0000  sulfamethoxazole-trimethoprim (BACTRIM DS) 800-160 MG tablet        1 tablet Oral 2 times daily 09/19/23 1152     09/18/23 1800  vancomycin (VANCOCIN) IVPB 1000 mg/200 mL premix  Status:  Discontinued       Placed in "Followed by" Linked Group   1,000 mg 200 mL/hr over 60 Minutes Intravenous Every 24 hours 09/17/23 1724 09/18/23 1555   09/18/23 1800  vancomycin (VANCOREADY) IVPB 1250 mg/250 mL  Status:  Discontinued        Placed in "Followed by" Linked Group   1,250 mg 166.7 mL/hr over 90 Minutes Intravenous Every 24 hours 09/18/23 1555 09/19/23 1416   09/18/23 0200  ceFEPIme (MAXIPIME) 2 g in sodium chloride 0.9 % 100 mL IVPB  Status:  Discontinued        2 g 200 mL/hr over 30 Minutes Intravenous Every 12 hours 09/17/23 1724 09/19/23 1441   09/18/23 0200  metroNIDAZOLE (FLAGYL) IVPB 500 mg        500 mg 100 mL/hr over 60 Minutes Intravenous Every 12 hours 09/17/23 1755 09/25/23 0159   09/17/23 1730  metroNIDAZOLE (FLAGYL) IVPB 500 mg  Status:  Discontinued        500 mg 100 mL/hr over 60 Minutes Intravenous Every 12 hours 09/17/23 1716 09/17/23 1755  09/17/23 1730  vancomycin (VANCOREADY) IVPB 750 mg/150 mL       Placed in "Followed by" Linked Group   750 mg 150 mL/hr over 60 Minutes Intravenous  Once 09/17/23 1724 09/17/23 2029   09/17/23 1315  ceFEPIme (MAXIPIME) 2 g in sodium chloride 0.9 % 100 mL IVPB        2 g 200 mL/hr over 30 Minutes Intravenous  Once 09/17/23 1308 09/17/23 1431   09/17/23 1315  metroNIDAZOLE (FLAGYL) IVPB 500 mg        500 mg 100 mL/hr over 60 Minutes Intravenous  Once 09/17/23 1308 09/17/23 1541   09/17/23 1315  vancomycin (VANCOCIN) IVPB 1000 mg/200 mL premix        1,000 mg 200 mL/hr over 60 Minutes Intravenous  Once 09/17/23 1308 09/17/23 1651       Assessment/Plan:  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 IR nothing drainable. He will follow-up with his surgeon at [facility]. No need for surgical intervention at this time. I spent 50 minutes in this encounter including extensive review of records, extensive review of imaging studies, coordinating his care, placing orders counseling the patient and performing documentation   Sterling Big, MD, [facility]  09/19/2023

## 2023-09-19 NOTE — ED Notes (Signed)
Pt was transferred over to a hospital bed.

## 2023-09-19 NOTE — Progress Notes (Signed)
Pharmacy Antibiotic Note  Karl Norman is a 77 y.o. male admitted on 09/17/2023 with  intra-abdominal infection .  Pharmacy has been consulted for vancomycin and cefepime dosing x 7 days.  Plan: Day 3 of antibiotics  WBC 12.2>>9.7>>9.1 ,  afebrile,  +norovirus Received vancomycin 1750 mg IV loading dose  Scr improved 1.54>>1.10>>0.81 Will adjust Vancomycin to 1750 mg IV Q24H. Goal AUC 400-550. Expected AUC: 488 Expected Css min: 9.3 SCr used: 0.81 Weight used: IBW, Vd used: 0.72 (BMI 24.63) Adjust cefepime to 2 g IV Q8H Patient is also on metronidazole 500 mg IV Q12H Continue to monitor renal function and follow culture results   Height: 5\' 8"  (172.7 cm) Weight: 73.5 kg (162 lb) IBW/kg (Calculated) : 68.4  Temp (24hrs), Avg:98 F (36.7 C), Min:97.8 F (36.6 C), Max:98.2 F (36.8 C)  Recent Labs  Lab 09/17/23 1107 09/17/23 1137 09/17/23 1355 09/18/23 0621 09/19/23 0549  WBC 12.2*  --   --  9.7 9.1  CREATININE  --  1.54*  --  1.10 0.81  LATICACIDVEN  --  6.8* 2.3*  --   --     Estimated Creatinine Clearance: 76.2 mL/min (by C-G formula based on SCr of 0.81 mg/dL).    Allergies  Allergen Reactions   Penicillins     Has patient had a PCN reaction causing immediate rash, facial/tongue/throat swelling, SOB or lightheadedness with hypotension: NO Has patient had a PCN reaction causing severe rash involving mucus membranes or skin necrosis: YES Has patient had a PCN reaction that required hospitalization: YES Has patient had a PCN reaction occurring within the last 10 years: NO If all of the above answers are "NO", then may proceed with Cephalosporin use.     Antimicrobials this admission: 12/27 Cefepime >>  12/27 Metronidazole >>  12/27 Vancomycin>>  Dose adjustments this admission: None  Microbiology results: 12/27 BCx: NGTD 12/28 GI panel:  + norovirus 12/28 Cdiff negative   Thank you for allowing pharmacy to be a part of this patients  care.  Angelique Blonder, PharmD Clinical Pharmacist 09/19/2023 2:38 PM

## 2023-09-19 NOTE — ED Notes (Signed)
Pt assisted with pericare post BM on bedpan.

## 2023-09-19 NOTE — Progress Notes (Signed)
*  PRELIMINARY RESULTS* Echocardiogram 2D Echocardiogram has been performed.  Karl Norman C Sheron Robin 09/19/2023, 10:59 AM

## 2023-09-19 NOTE — TOC Transition Note (Addendum)
Transition of Care [facility]) - Discharge Note   Patient Details  Name: Karl Norman MRN: [id number] Date of Birth: 1946-06-29  Transition of Care [facility]) CM/SW Contact:  Verna Czech [place], [place] Phone Number: 09/19/2023, 3:13 PM   Clinical Narrative:    CSW met with patient and spouse before discharge home to discuss [facility] recommendation for PT, OT and RN. Patient to return home with spouse, family will transport patient home. Patient is followed by Dr. Judithann Sheen through the [facility]. He uses [facility] in [place]. Patient confirms having a walker, bed side commode, shower chair, crutches and a cain. Patient agreeable to home health, does not have preference in terms of agency used. Elnita Maxwell from [facility] contacted and has accepted patient for [facility] services (PT, OT, RN). She will contact patient this evening to schedule initial visit. Patient agreeable to [facility] services on a short term basis.     Final next level of care: Home w Home Health Services Barriers to Discharge: No Barriers Identified   Patient Goals and CMS Choice Patient states their goals for this hospitalization and ongoing recovery are:: "I wan to see if I can stand up and walk" CMS Medicare.gov Compare Post Acute Care list provided to:: Patient Choice offered to / list presented to : Patient      Discharge Placement                       Discharge Plan and Services Additional resources added to the After Visit Summary for   In-house Referral: Clinical Social Work   Post Acute Care Choice: Home Health                    HH Arranged: PT, OT, RN [facility] Agency: [facility] Contacted: 09/19/23 Time HH Agency Contacted: [phone]    Social Drivers of Health (SDOH) Interventions SDOH Screenings   Food Insecurity: Unknown (09/19/2023)  Housing: Unknown (09/19/2023)  Transportation Needs: No Transportation Needs (09/19/2023)  Utilities: Not At Risk (09/19/2023)  Tobacco Use: Medium  Risk (09/17/2023)     Readmission Risk Interventions     No data to display

## 2023-09-22 LAB — CULTURE, BLOOD (ROUTINE X 2)
Culture: NO GROWTH
Culture: NO GROWTH
Special Requests: ADEQUATE
Special Requests: ADEQUATE

## 2024-01-24 ENCOUNTER — Observation Stay

## 2024-01-24 ENCOUNTER — Other Ambulatory Visit: Payer: Self-pay

## 2024-01-24 ENCOUNTER — Inpatient Hospital Stay
Admission: EM | Admit: 2024-01-24 | Discharge: 2024-01-28 | DRG: 377 | Disposition: A | Discharge status: Hospice | Attending: Obstetrics and Gynecology | Admitting: Obstetrics and Gynecology

## 2024-01-24 ENCOUNTER — Emergency Department

## 2024-01-24 ENCOUNTER — Encounter: Payer: Self-pay | Admitting: Internal Medicine

## 2024-01-24 DIAGNOSIS — D6832 Hemorrhagic disorder due to extrinsic circulating anticoagulants: Secondary | ICD-10-CM | POA: Diagnosis present

## 2024-01-24 DIAGNOSIS — J9601 Acute respiratory failure with hypoxia: Secondary | ICD-10-CM | POA: Diagnosis not present

## 2024-01-24 DIAGNOSIS — Z87891 Personal history of nicotine dependence: Secondary | ICD-10-CM

## 2024-01-24 DIAGNOSIS — R601 Generalized edema: Secondary | ICD-10-CM | POA: Diagnosis not present

## 2024-01-24 DIAGNOSIS — K264 Chronic or unspecified duodenal ulcer with hemorrhage: Principal | ICD-10-CM | POA: Diagnosis present

## 2024-01-24 DIAGNOSIS — W19XXXA Unspecified fall, initial encounter: Principal | ICD-10-CM

## 2024-01-24 DIAGNOSIS — R042 Hemoptysis: Secondary | ICD-10-CM | POA: Insufficient documentation

## 2024-01-24 DIAGNOSIS — E8809 Other disorders of plasma-protein metabolism, not elsewhere classified: Secondary | ICD-10-CM | POA: Diagnosis present

## 2024-01-24 DIAGNOSIS — R531 Weakness: Secondary | ICD-10-CM | POA: Diagnosis not present

## 2024-01-24 DIAGNOSIS — K2970 Gastritis, unspecified, without bleeding: Secondary | ICD-10-CM | POA: Diagnosis present

## 2024-01-24 DIAGNOSIS — K219 Gastro-esophageal reflux disease without esophagitis: Secondary | ICD-10-CM | POA: Diagnosis present

## 2024-01-24 DIAGNOSIS — L89151 Pressure ulcer of sacral region, stage 1: Secondary | ICD-10-CM | POA: Diagnosis not present

## 2024-01-24 DIAGNOSIS — L8990 Pressure ulcer of unspecified site, unspecified stage: Secondary | ICD-10-CM | POA: Insufficient documentation

## 2024-01-24 DIAGNOSIS — Z66 Do not resuscitate: Secondary | ICD-10-CM | POA: Diagnosis present

## 2024-01-24 DIAGNOSIS — B182 Chronic viral hepatitis C: Secondary | ICD-10-CM | POA: Diagnosis present

## 2024-01-24 DIAGNOSIS — I6523 Occlusion and stenosis of bilateral carotid arteries: Secondary | ICD-10-CM | POA: Diagnosis present

## 2024-01-24 DIAGNOSIS — M7989 Other specified soft tissue disorders: Secondary | ICD-10-CM | POA: Diagnosis present

## 2024-01-24 DIAGNOSIS — I110 Hypertensive heart disease with heart failure: Secondary | ICD-10-CM | POA: Diagnosis present

## 2024-01-24 DIAGNOSIS — T45515A Adverse effect of anticoagulants, initial encounter: Secondary | ICD-10-CM | POA: Diagnosis not present

## 2024-01-24 DIAGNOSIS — R188 Other ascites: Secondary | ICD-10-CM | POA: Diagnosis present

## 2024-01-24 DIAGNOSIS — I480 Paroxysmal atrial fibrillation: Secondary | ICD-10-CM | POA: Diagnosis present

## 2024-01-24 DIAGNOSIS — D62 Acute posthemorrhagic anemia: Secondary | ICD-10-CM | POA: Diagnosis present

## 2024-01-24 DIAGNOSIS — I959 Hypotension, unspecified: Secondary | ICD-10-CM | POA: Diagnosis not present

## 2024-01-24 DIAGNOSIS — K2211 Ulcer of esophagus with bleeding: Secondary | ICD-10-CM | POA: Diagnosis present

## 2024-01-24 DIAGNOSIS — R55 Syncope and collapse: Secondary | ICD-10-CM | POA: Diagnosis present

## 2024-01-24 DIAGNOSIS — E440 Moderate protein-calorie malnutrition: Secondary | ICD-10-CM | POA: Diagnosis present

## 2024-01-24 DIAGNOSIS — Z6821 Body mass index (BMI) 21.0-21.9, adult: Secondary | ICD-10-CM

## 2024-01-24 DIAGNOSIS — Z880 Allergy status to penicillin: Secondary | ICD-10-CM

## 2024-01-24 DIAGNOSIS — I10 Essential (primary) hypertension: Secondary | ICD-10-CM | POA: Diagnosis present

## 2024-01-24 DIAGNOSIS — R21 Rash and other nonspecific skin eruption: Secondary | ICD-10-CM | POA: Diagnosis present

## 2024-01-24 DIAGNOSIS — Z96659 Presence of unspecified artificial knee joint: Secondary | ICD-10-CM | POA: Diagnosis present

## 2024-01-24 DIAGNOSIS — Z806 Family history of leukemia: Secondary | ICD-10-CM

## 2024-01-24 DIAGNOSIS — Z7901 Long term (current) use of anticoagulants: Secondary | ICD-10-CM

## 2024-01-24 DIAGNOSIS — Z79899 Other long term (current) drug therapy: Secondary | ICD-10-CM

## 2024-01-24 DIAGNOSIS — Z9049 Acquired absence of other specified parts of digestive tract: Secondary | ICD-10-CM

## 2024-01-24 DIAGNOSIS — I951 Orthostatic hypotension: Secondary | ICD-10-CM | POA: Diagnosis present

## 2024-01-24 DIAGNOSIS — I081 Rheumatic disorders of both mitral and tricuspid valves: Secondary | ICD-10-CM | POA: Diagnosis present

## 2024-01-24 DIAGNOSIS — Z8249 Family history of ischemic heart disease and other diseases of the circulatory system: Secondary | ICD-10-CM

## 2024-01-24 DIAGNOSIS — R5381 Other malaise: Secondary | ICD-10-CM | POA: Diagnosis present

## 2024-01-24 DIAGNOSIS — Z9181 History of falling: Secondary | ICD-10-CM

## 2024-01-24 DIAGNOSIS — J449 Chronic obstructive pulmonary disease, unspecified: Secondary | ICD-10-CM | POA: Diagnosis present

## 2024-01-24 DIAGNOSIS — K861 Other chronic pancreatitis: Secondary | ICD-10-CM | POA: Diagnosis present

## 2024-01-24 DIAGNOSIS — I5022 Chronic systolic (congestive) heart failure: Secondary | ICD-10-CM | POA: Diagnosis present

## 2024-01-24 LAB — CBC WITH DIFFERENTIAL/PLATELET
Abs Immature Granulocytes: 0.08 10*3/uL — ABNORMAL HIGH (ref 0.00–0.07)
Basophils Absolute: 0.1 10*3/uL (ref 0.0–0.1)
Basophils Relative: 1 %
Eosinophils Absolute: 0.1 10*3/uL (ref 0.0–0.5)
Eosinophils Relative: 1 %
HCT: 37.5 % — ABNORMAL LOW (ref 39.0–52.0)
Hemoglobin: 12 g/dL — ABNORMAL LOW (ref 13.0–17.0)
Immature Granulocytes: 1 %
Lymphocytes Relative: 13 %
Lymphs Abs: 1.3 10*3/uL (ref 0.7–4.0)
MCH: 29.3 pg (ref 26.0–34.0)
MCHC: 32 g/dL (ref 30.0–36.0)
MCV: 91.7 fL (ref 80.0–100.0)
Monocytes Absolute: 0.5 10*3/uL (ref 0.1–1.0)
Monocytes Relative: 5 %
Neutro Abs: 8.4 10*3/uL — ABNORMAL HIGH (ref 1.7–7.7)
Neutrophils Relative %: 79 %
Platelets: 283 10*3/uL (ref 150–400)
RBC: 4.09 MIL/uL — ABNORMAL LOW (ref 4.22–5.81)
RDW: 13.8 % (ref 11.5–15.5)
WBC: 10.4 10*3/uL (ref 4.0–10.5)
nRBC: 0 % (ref 0.0–0.2)

## 2024-01-24 LAB — D-DIMER, QUANTITATIVE: D-Dimer, Quant: 2.9 ug{FEU}/mL — ABNORMAL HIGH (ref 0.00–0.50)

## 2024-01-24 LAB — PROTIME-INR
INR: 1.1 (ref 0.8–1.2)
Prothrombin Time: 14 s (ref 11.4–15.2)

## 2024-01-24 LAB — COMPREHENSIVE METABOLIC PANEL WITH GFR
ALT: 14 U/L (ref 0–44)
AST: 28 U/L (ref 15–41)
Albumin: 1.6 g/dL — ABNORMAL LOW (ref 3.5–5.0)
Alkaline Phosphatase: 96 U/L (ref 38–126)
Anion gap: 7 (ref 5–15)
BUN: 8 mg/dL (ref 8–23)
CO2: 21 mmol/L — ABNORMAL LOW (ref 22–32)
Calcium: 7.6 mg/dL — ABNORMAL LOW (ref 8.9–10.3)
Chloride: 110 mmol/L (ref 98–111)
Creatinine, Ser: 1.22 mg/dL (ref 0.61–1.24)
GFR, Estimated: >60 mL/min (ref >60)
Glucose, Bld: 95 mg/dL (ref 70–99)
Potassium: 4.4 mmol/L (ref 3.5–5.1)
Sodium: 138 mmol/L (ref 135–145)
Total Bilirubin: 0.6 mg/dL (ref 0.0–1.2)
Total Protein: 5.2 g/dL — ABNORMAL LOW (ref 6.5–8.1)

## 2024-01-24 LAB — BRAIN NATRIURETIC PEPTIDE: B Natriuretic Peptide: 76.2 pg/mL (ref 0.0–100.0)

## 2024-01-24 LAB — CK: Total CK: 67 U/L (ref 49–397)

## 2024-01-24 LAB — TROPONIN I (HIGH SENSITIVITY)
Troponin I (High Sensitivity): 11 ng/L (ref <18)
Troponin I (High Sensitivity): 12 ng/L (ref <18)

## 2024-01-24 LAB — MAGNESIUM: Magnesium: 1.5 mg/dL — ABNORMAL LOW (ref 1.7–2.4)

## 2024-01-24 LAB — LACTIC ACID, PLASMA: Lactic Acid, Venous: 1.9 mmol/L (ref 0.5–1.9)

## 2024-01-24 LAB — TSH: TSH: 4.813 u[IU]/mL — ABNORMAL HIGH (ref 0.350–4.500)

## 2024-01-24 MED ORDER — POLYETHYLENE GLYCOL 3350 17 G PO PACK: 17.0000 g | PACK | Freq: Every day | ORAL | Status: DC | PRN

## 2024-01-24 MED ORDER — ACETAMINOPHEN 650 MG RE SUPP: 650.0000 mg | Freq: Four times a day (QID) | RECTAL | Status: DC | PRN

## 2024-01-24 MED ORDER — THIAMINE MONONITRATE 100 MG PO TABS
100.0000 mg | ORAL_TABLET | Freq: Every day | ORAL | Status: DC
  Administered 2024-01-24 – 2024-01-28 (×3): 100 mg via ORAL
  Filled 2024-01-24 (×3): qty 1

## 2024-01-24 MED ORDER — ONDANSETRON HCL 4 MG PO TABS: 4.0000 mg | ORAL_TABLET | Freq: Four times a day (QID) | ORAL | Status: DC | PRN

## 2024-01-24 MED ORDER — IOHEXOL 350 MG/ML SOLN
75.0000 mL | Freq: Once | INTRAVENOUS | Status: AC | PRN
Start: 2024-01-24 — End: 2024-01-24
  Administered 2024-01-24: 75 mL via INTRAVENOUS

## 2024-01-24 MED ORDER — MIDODRINE HCL 5 MG PO TABS
5.0000 mg | ORAL_TABLET | Freq: Once | ORAL | Status: AC
  Administered 2024-01-24: 5 mg via ORAL
  Filled 2024-01-24: qty 1

## 2024-01-24 MED ORDER — ONDANSETRON HCL 4 MG/2ML IJ SOLN
4.0000 mg | Freq: Four times a day (QID) | INTRAMUSCULAR | Status: DC | PRN
  Administered 2024-01-25 – 2024-01-26 (×3): 4 mg via INTRAVENOUS
  Filled 2024-01-24 (×4): qty 2

## 2024-01-24 MED ORDER — SODIUM CHLORIDE 0.9% FLUSH
3.0000 mL | Freq: Two times a day (BID) | INTRAVENOUS | Status: DC
  Administered 2024-01-24 – 2024-01-28 (×7): 3 mL via INTRAVENOUS

## 2024-01-24 MED ORDER — ADULT MULTIVITAMIN W/MINERALS CH
1.0000 | ORAL_TABLET | Freq: Every day | ORAL | Status: DC
  Administered 2024-01-24 – 2024-01-28 (×3): 1 via ORAL
  Filled 2024-01-24 (×3): qty 1

## 2024-01-24 MED ORDER — ALBUMIN HUMAN 5 % IV SOLN
12.5000 g | Freq: Four times a day (QID) | INTRAVENOUS | Status: AC
  Administered 2024-01-24 – 2024-01-25 (×2): 12.5 g via INTRAVENOUS
  Filled 2024-01-24 (×3): qty 250

## 2024-01-24 MED ORDER — MIDODRINE HCL 5 MG PO TABS
5.0000 mg | ORAL_TABLET | Freq: Three times a day (TID) | ORAL | Status: DC
  Administered 2024-01-25 – 2024-01-28 (×9): 5 mg via ORAL
  Filled 2024-01-24 (×9): qty 1

## 2024-01-24 MED ORDER — IOHEXOL 350 MG/ML SOLN
30.0000 mL | Freq: Once | INTRAVENOUS | Status: AC | PRN
  Administered 2024-01-24: 30 mL via INTRAVENOUS

## 2024-01-24 MED ORDER — APIXABAN 5 MG PO TABS
5.0000 mg | ORAL_TABLET | Freq: Two times a day (BID) | ORAL | Status: DC
  Administered 2024-01-24 – 2024-01-25 (×3): 5 mg via ORAL
  Filled 2024-01-24 (×3): qty 1

## 2024-01-24 MED ORDER — ACETAMINOPHEN 325 MG PO TABS: 650.0000 mg | ORAL_TABLET | Freq: Four times a day (QID) | ORAL | Status: DC | PRN

## 2024-01-24 MED ORDER — LOPERAMIDE HCL 2 MG PO CAPS
4.0000 mg | ORAL_CAPSULE | ORAL | Status: DC | PRN
  Administered 2024-01-25 (×3): 4 mg via ORAL
  Filled 2024-01-24 (×3): qty 2

## 2024-01-24 MED ORDER — FOLIC ACID 1 MG PO TABS
1.0000 mg | ORAL_TABLET | Freq: Every day | ORAL | Status: DC
  Administered 2024-01-24 – 2024-01-28 (×3): 1 mg via ORAL
  Filled 2024-01-24 (×3): qty 1

## 2024-01-24 NOTE — Assessment & Plan Note (Signed)
 Patient states that he has been experiencing worsening generalized weakness for several months now.  This is likely multifactorial, however driven by significantly sedentary lifestyle and deconditioning.  - PT/OT

## 2024-01-24 NOTE — H&P (Signed)
 History and Physical    Patient: Karl Norman:[id number] DOB: October 22, 1945 DOA: 01/24/2024 DOS: the patient was seen and examined on 01/24/2024 PCP: Yehuda Helms, MD  Patient coming from: Home  Chief Complaint:  Chief Complaint  Patient presents with   Fall   HPI: Karl Norman is a 78 y.o. male with medical history significant of hypertension, chronic systolic heart failure with recovered EF, paroxysmal atrial fibrillation, chronic hepatitis C, chronic pancreatitis, COPD, DVT, choledocholithiasis, who presents to the ED due to ground-level fall.  Mr. Cordia states that over the last few days, he has been noticing shortness of breath when standing with some dizziness.  He states that overall, this is not unusual for him as he has experienced this in the past.  Earlier today, he stood up from the commode and took 1 step and suddenly fell.  He denies loss of consciousness.  He states that it happened so quickly, he is not sure if he had dizziness or not.  At this time, he denies any chest pain, palpitations or shortness of breath.  He denies any recent illnesses.  He endorses severe lower extremity swelling for which he takes Lasix  as needed.  He states that sometimes it helps, other times it does not.  He has noticed some swelling of his abdomen but denies distention.  Mr. Sciarretta states that since his multiple complications after cholecystectomy last year, he has been essentially sedentary.  He states that he lives between his recliner and the bathroom.  His appetite is unchanged.  He feels that his severe lower extremity swelling has contributed to his sedentary lifestyle.  He has not been taking Eliquis  since his discharge in December 2024.  ED course: On arrival to the ED, patient was normotensive at 92/67 with heart rate of 111.  He was saturating at 100% on room air.  He was afebrile at 97.5.  Blood pressure decreased as low as 77/52.  On exertion, oxygen saturation decreased as low as 82%.   Initial workup notable for hemoglobin 12.0, magnesium 1.5, albumin 1.6.  Otherwise normal BNP, troponin x 2, CK.  Chest x-ray with no active disease.  Patient started on midodrine and [facility] contacted for admission.  Review of Systems: As mentioned in the history of present illness. All other systems reviewed and are negative.  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   Pancreatitis    Pneumatouria    Past Surgical History:  Procedure Laterality Date   CARDIOVERSION N/A 10/24/2020   Procedure: CARDIOVERSION;  Surgeon: Michelle Aid, MD;  Location: [facility] ORS;  Service: Cardiovascular;  Laterality: N/A;   COLONOSCOPY WITH PROPOFOL  N/A 08/15/2018   Procedure: COLONOSCOPY WITH PROPOFOL ;  Surgeon: Cassie Click, MD;  Location: [facility] ENDOSCOPY;  Service: Endoscopy;  Laterality: N/A;   COLONOSCOPY WITH PROPOFOL  N/A 01/20/2022   Procedure: COLONOSCOPY WITH PROPOFOL ;  Surgeon: Shane Darling, MD;  Location: [facility] ENDOSCOPY;  Service: Endoscopy;  Laterality: N/A;   ERCP with stent  07/22/2018  ESOPHAGOGASTRODUODENOSCOPY     6 times   ESOPHAGOGASTRODUODENOSCOPY (EGD) WITH PROPOFOL  N/A 01/20/2022   Procedure: ESOPHAGOGASTRODUODENOSCOPY (EGD) WITH PROPOFOL ;  Surgeon: Shane Darling, MD;  Location: [facility] ENDOSCOPY;  Service: Endoscopy;  Laterality: N/A;   REPLACEMENT TOTAL KNEE     TEE WITHOUT CARDIOVERSION N/A 11/11/2015   Procedure: TRANSESOPHAGEAL ECHOCARDIOGRAM (TEE);  Surgeon: Michelle Aid, MD;  Location: [facility] ORS;  Service: Cardiovascular;  Laterality: N/A;   TEE  WITHOUT CARDIOVERSION N/A 10/24/2020   Procedure: TRANSESOPHAGEAL ECHOCARDIOGRAM (TEE);  Surgeon: Michelle Aid, MD;  Location: [facility] ORS;  Service: Cardiovascular;  Laterality: N/A;   Social History:  reports that he has quit smoking. He has never used smokeless tobacco. He reports current alcohol use. He reports that he does not currently use drugs.  Allergies  Allergen Reactions   Penicillins     Has patient had a PCN reaction causing immediate rash, facial/tongue/throat swelling, SOB or lightheadedness with hypotension: NO Has patient had a PCN reaction causing severe rash involving mucus membranes or skin necrosis: YES Has patient had a PCN reaction that required hospitalization: YES Has patient had a PCN reaction occurring within the last 10 years: NO If all of the above answers are "NO", then may proceed with Cephalosporin use.     Family History  Problem Relation Age of Onset   Heart failure Father    Leukemia Brother     Prior to Admission medications   Medication Sig Start Date End Date Taking? Authorizing Provider  apixaban  (ELIQUIS ) 5 MG TABS tablet Take 1 tablet (5 mg total) by mouth 2 (two) times daily. 02/06/21   Charlette Console, FNP  b complex vitamins capsule Take 1 capsule by mouth daily. Patient not taking: Reported on 09/17/2023    [provider]  chlorhexidine  (HIBICLENS ) 4 % external liquid Apply topically daily as needed (wound cleansing). 09/19/23   Alexander, Natalie, DO  ferrous sulfate 324 MG TBEC Take 324 mg by mouth daily as needed. Patient not taking: Reported on 09/17/2023    [provider]  furosemide  (LASIX ) 40 MG tablet Take 1 tablet (40 mg total) by mouth ONCE OR TWICE daily (total daily dose maximum 80 mg) as needed for up to 3 days for increased leg swelling, shortness of breath, weight gain 5+ lbs over 1-2 days. Seek medical care if these symptoms are not improving with increased dose. 09/19/23   Alexander, Natalie, DO   ibuprofen (ADVIL) 200 MG tablet Take 400 mg by mouth every 6 (six) hours as needed for mild pain. Patient not taking: Reported on 09/17/2023    [provider]  loperamide (IMODIUM) 2 MG capsule Take 4 mg by mouth 2 (two) times daily.    [provider]  metoprolol  tartrate (LOPRESSOR ) 25 MG tablet Take 0.5 tablets (12.5 mg total) by mouth 2 (two) times daily. HOLD THIS MEDICINE if blood pressure less than 110 top number and/or less than 70 bottom number 09/19/23   Alexander, Natalie, DO  omeprazole (PRILOSEC) 20 MG capsule Take 20 mg by mouth daily.    [provider]  simethicone  (MYLICON) 80 MG chewable tablet Chew 80 mg by mouth every 6 (six) hours as needed for flatulence.    [provider]  sulfamethoxazole -trimethoprim  (BACTRIM  DS) 800-160 MG tablet Take 1 tablet by mouth 2 (two) times daily. 09/19/23   Alexander, Natalie, DO  vitamin B-12 (CYANOCOBALAMIN ) 1000 MCG tablet Take 1,000 mcg by mouth daily. Patient not taking: Reported on 09/17/2023  [provider]   Physical Exam: Vitals:   01/24/24 1642 01/24/24 1643 01/24/24 1645 01/24/24 1646  BP:   99/60   Pulse: (!) 102   98  Resp: 17 (!) 23 (!) 22 (!) 26  Temp:      TempSrc:      SpO2: 93%   100%  Weight:      Height:       Physical Exam Vitals and nursing note reviewed.  Constitutional:      General: He is not in acute distress.    Appearance: He is not ill-appearing.  HENT:     Head: Normocephalic and atraumatic.     Mouth/Throat:     Mouth: Mucous membranes are dry.  Cardiovascular:     Rate and Rhythm: Regular rhythm. Tachycardia present.     Heart sounds: No murmur heard. Pulmonary:     Effort: Pulmonary effort is normal. No respiratory distress.     Breath sounds: Normal breath sounds. No wheezing, rhonchi or rales.  Abdominal:     General: Bowel sounds are normal. There is no distension.     Palpations: Abdomen is soft.     Tenderness: There is no abdominal  tenderness.     Comments: Pitting edema of abdominal wall  Musculoskeletal:     Right lower leg: 2+ Pitting Edema present.     Left lower leg: 2+ Pitting Edema present.  Skin:    General: Skin is warm and dry.     Findings: Rash present. Rash is purpuric.     Comments: Purpura of bilateral lower extremities, right more than left.  Patient states chronic  Neurological:     Mental Status: He is alert and oriented to person, place, and time.     Motor: Weakness (Diffusely) present.    Data Reviewed: CBC with WBC of 10.4, hemoglobin of 12.0, platelets of 283 CMP with sodium of 138, potassium 4.4, bicarb 21, BUN 8, creatinine 1.22, calcium  7.6, albumin 1.6, AST 28, ALT 14, GFR above 60 Magnesium 1.5 BNP 76 Troponin 11 and then 12 CK 67  EKG personally reviewed.  Sinus rhythm with rate of 110.  Right bundle branch block.  No acute ischemic changes.  DG Chest Portable 1 View Result Date: 01/24/2024 CLINICAL DATA:  Fall, cough. History of congestive heart failure. Weakness and dizziness. EXAM: PORTABLE CHEST 1 VIEW COMPARISON:  Chest CT 09/17/2023 and chest radiograph 10/21/2020. FINDINGS: Atherosclerotic calcification of the aortic arch. Old healed left rib fractures. Mild scarring at the left lung base. Heart size within normal limits. Emphysema noted. Mild central airway thickening which may reflect bronchitis or reactive airways disease. IMPRESSION: 1. Mild central airway thickening which may reflect bronchitis or reactive airways disease. 2. Aortic Atherosclerosis (ICD10-I70.0) and Emphysema (ICD10-J43.9). 3. Old healed left rib fractures. Electronically Signed   By: Freida Jes M.D.   On: 01/24/2024 14:01   Results are pending, will review when available.  Assessment and Plan:  * Hypotension Patient is presenting with supine hypotension with dizziness and shortness of breath on standing, likely due to orthostasis.Per chart review, he has a history of chronic hypotension, however  this was while on antihypertensives.  He states that he has stopped all his antihypertensives when he was discharged from the hospital in December 2024 so unclear etiology at this time.  In the setting of anasarca, concerning for severe malnutrition vs cirrhosis. Differential also includes PE given no longer taking Eliquis  and prior DVT. Less likely HF related, however will  obtain echo.   - Continue midodrine 5 mg 3 times daily for now - Trial of albumin to avoid further third spacing - D-dimer, TSH, INR - Ultrasound to assess for ascites - Echocardiogram - Hold home diuretic  Anasarca Diffuse anasarca on exam with albumin of 1.6.  Given history of chronic hepatitis C, he is at risk for cirrhosis. Less suspicious for acute HF given normal BNP.   - INR and ascites ultrasound as noted above  - Protein/creatinine ratio - Dietitian consulted  Acute hypoxic respiratory failure (HCC) Patient experiences shortness of breath with hypoxia on minimal exertion.  Given lack of abnormalities on chest x-ray and examination, potentially severe deconditioning +/- atelectasis.    - Will need to check ambulatory oxygen saturation once hypotension is improved - Supplemental oxygen as needed - Incentive spirometry - Obtain D-dimer to rule out PE  Generalized weakness Patient states that he has been experiencing worsening generalized weakness for several months now.  This is likely multifactorial, however driven by significantly sedentary lifestyle and deconditioning.  - PT/OT  Chronic systolic CHF (congestive heart failure), NYHA class 2 (HCC) Patient has a history of HFrEF with EF as low as 25% with recovery on most recent echocardiogram in December 2024 demonstrating EF of 55-60%.  Moderate mitral valve and tricuspid valve regurg noted at that time.  Diffuse anasarca on exam, however low suspicion this is due to heart failure.  - Daily weights - Strict ins and out  Paroxysmal A-fib (HCC) - Restart  home Eliquis   Advance Care Planning:   Code Status: Do not attempt resuscitation (DNR) PRE-ARREST INTERVENTIONS DESIRED verified by patient.  Consults: None  Family Communication: No family at bedside  Severity of Illness: The appropriate patient status for this patient is OBSERVATION. Observation status is judged to be reasonable and necessary in order to provide the required intensity of service to ensure the patient's safety. The patient's presenting symptoms, physical exam findings, and initial radiographic and laboratory data in the context of their medical condition is felt to place them at decreased risk for further clinical deterioration. Furthermore, it is anticipated that the patient will be medically stable for discharge from the hospital within 2 midnights of admission.   Author: Avi Body, MD 01/24/2024 5:18 PM  For on call review [email or web address].

## 2024-01-24 NOTE — Assessment & Plan Note (Addendum)
 Patient experiences shortness of breath with hypoxia on minimal exertion.  Given lack of abnormalities on chest x-ray and examination, potentially severe deconditioning +/- atelectasis.    - Will need to check ambulatory oxygen saturation once hypotension is improved - Supplemental oxygen as needed - Incentive spirometry - Obtain D-dimer to rule out PE

## 2024-01-24 NOTE — Assessment & Plan Note (Addendum)
 Diffuse anasarca on exam with albumin of 1.6.  Given history of chronic hepatitis C, he is at risk for cirrhosis. Less suspicious for acute HF given normal BNP.   - INR and ascites ultrasound as noted above  - Protein/creatinine ratio - Dietitian consulted

## 2024-01-24 NOTE — Assessment & Plan Note (Signed)
 Patient has a history of HFrEF with EF as low as 25% with recovery on most recent echocardiogram in December 2024 demonstrating EF of 55-60%.  Moderate mitral valve and tricuspid valve regurg noted at that time.  Diffuse anasarca on exam, however low suspicion this is due to heart failure.  - Daily weights - Strict ins and out

## 2024-01-24 NOTE — ED Notes (Signed)
 This RN gave report to [facility] and performed care handoff. Call light in reach, bed wheels locked, side rails raised, pt updated on plan of care. Rounding completed.

## 2024-01-24 NOTE — ED Notes (Signed)
   01/24/24 1156  Vitals  Temp (!) 97.5 F (36.4 C)  Temp Source Oral  BP (!) 77/52  MAP (mmHg) (!) 61  BP Location Left Arm  BP Method Automatic  Patient Position (if appropriate) Lying  Pulse Rate (!) 110  Pulse Rate Source Monitor  ECG Heart Rate (!) 114  Resp 17  MEWS COLOR  MEWS Score Color Red  Oxygen Therapy  SpO2 100 %  O2 Device Room Air  Pulse Oximetry Type Continuous  MEWS Score  MEWS Temp 0  MEWS Systolic 2  MEWS Pulse 2  MEWS RR 0  MEWS LOC 0  MEWS Score 4   MD aware and is bedside. Pt A&Ox4 and stable.

## 2024-01-24 NOTE — ED Provider Notes (Signed)
 [facility] Provider Note    Event Date/Time   First MD Initiated Contact with Patient 01/24/24 1142     (approximate)   History   Fall   HPI  Karl Norman is a 78 y.o. male who presents to the ED for evaluation of Fall   Review of medical DC summary from 12/29.  Admitted for sepsis associated with an abdominal wall abscess.  Otherwise history of paroxysmal A-fib on Eliquis , CHF and mildly reduced EF  Patient presents from home via EMS after a fall with difficulty getting up.  Reports he was on the toilet, got up and developed lightheaded dizziness, fell to the ground without syncope and had trouble getting up.  He was on the ground for about 1 hour prior to EMS arriving helping him up and bring him here.  Some episodic presyncopal dizziness over the past couple days without syncope.   Denies any pain or injuries from this fall today.  Reports he feels fine and asymptomatic on arrival to the ED.   Physical Exam   Triage Vital Signs: ED Triage Vitals  Encounter Vitals Group     BP      Systolic BP Percentile      Diastolic BP Percentile      Pulse      Resp      Temp      Temp src      SpO2      Weight      Height      Head Circumference      Peak Flow      Pain Score      Pain Loc      Pain Education      Exclude from Growth Chart     Most recent vital signs: Vitals:   01/24/24 1455 01/24/24 1500  BP:  95/60  Pulse: 77   Resp: 17 (!) 25  Temp:    SpO2: 100%     General: Awake, no distress.  CV:  Good peripheral perfusion.  Resp:  Normal effort.  Abd:  No distention.  MSK:  No deformity noted.  Lower extremity edema.  Various superficial skin tears but no other deformity or signs of acute trauma from his fall. Neuro:  No focal deficits appreciated. Other:     ED Results / Procedures / Treatments   Labs (all labs ordered are listed, but only abnormal results are displayed) Labs Reviewed  COMPREHENSIVE METABOLIC PANEL  WITH GFR - Abnormal; Notable for the following components:      Result Value   CO2 21 (*)    Calcium  7.6 (*)    Total Protein 5.2 (*)    Albumin 1.6 (*)    All other components within normal limits  CBC WITH DIFFERENTIAL/PLATELET - Abnormal; Notable for the following components:   RBC 4.09 (*)    Hemoglobin 12.0 (*)    HCT 37.5 (*)    Neutro Abs 8.4 (*)    Abs Immature Granulocytes 0.08 (*)    All other components within normal limits  MAGNESIUM - Abnormal; Notable for the following components:   Magnesium 1.5 (*)    All other components within normal limits  BRAIN NATRIURETIC PEPTIDE  CK  TROPONIN I (HIGH SENSITIVITY)  TROPONIN I (HIGH SENSITIVITY)    EKG Low amplitude, sinus tachycardia, right bundle, rate of 110 bpm.  No STEMI.  RADIOLOGY CXR interpreted by me without evidence of acute cardiopulmonary pathology.  Official radiology  report(s): DG Chest Portable 1 View Result Date: 01/24/2024 CLINICAL DATA:  Fall, cough. History of congestive heart failure. Weakness and dizziness. EXAM: PORTABLE CHEST 1 VIEW COMPARISON:  Chest CT 09/17/2023 and chest radiograph 10/21/2020. FINDINGS: Atherosclerotic calcification of the aortic arch. Old healed left rib fractures. Mild scarring at the left lung base. Heart size within normal limits. Emphysema noted. Mild central airway thickening which may reflect bronchitis or reactive airways disease. IMPRESSION: 1. Mild central airway thickening which may reflect bronchitis or reactive airways disease. 2. Aortic Atherosclerosis (ICD10-I70.0) and Emphysema (ICD10-J43.9). 3. Old healed left rib fractures. Electronically Signed   By: Freida Jes M.D.   On: 01/24/2024 14:01    PROCEDURES and INTERVENTIONS:  .1-3 Lead EKG Interpretation  Performed by: Arline Bennett, MD Authorized by: Arline Bennett, MD     Interpretation: abnormal     ECG rate:  110   ECG rate assessment: tachycardic     Rhythm: sinus tachycardia     Ectopy: none      Conduction: normal   .Ultrasound ED Peripheral IV (Provider)  Date/Time: 01/24/2024 3:15 PM  Performed by: Arline Bennett, MD Authorized by: Arline Bennett, MD   Procedure details:    Indications: multiple failed IV attempts and poor IV access     Skin Prep: chlorhexidine  gluconate     Location: left basilic v.   Angiocath:  20 G   Bedside Ultrasound Guided: Yes     Images: not archived     Patient tolerated procedure without complications: Yes     Dressing applied: Yes     Medications  midodrine (PROAMATINE) tablet 5 mg (has no administration in time range)     IMPRESSION / MDM / ASSESSMENT AND PLAN / ED COURSE  I reviewed the triage vital signs and the nursing notes.  Differential diagnosis includes, but is not limited to, renal failure, rhabdomyolysis, ICH or stroke, symptomatic A-fib  {Patient presents with symptoms of an acute illness or injury that is potentially life-threatening.  Patient with CHF presents to the ED after a fall with progressively worsening generalized weakness, recurrent falls and ambulatory dysfunction.  Soft blood pressures and appears volume overload.  He has peripheral edema without orthopnea or shortness of breath.  His BNP is low and has a clear CXR.  2 negative troponins, low CK.  Essentially normal CBC and metabolic panel.  As below, attempt to ambulate the patient do not go well.  He is agreeable for admission to see physical therapy, work on his blood pressure and diuresis.  Will consult with medicine for admission  Clinical Course as of 01/24/24 1515  Mon Jan 24, 2024  1210 USIV, 20g left basilic v  [DS]  1324 Reassessed.  Wife now at the bedside and describes her concerns.  Patient remains asymptomatic.  He is eager to go home.  We discussed obtaining a CXR ambulatory trial.  I offered admission but he declines repeatedly.  He has capacity.  Wife is concerned about simply to ambulate at home.  Questions if he needs hospice at home. [DS]  1455  Attempted to ambulate the patient, with standing he desaturates, becomes more tachycardic, dyspneic and does not do well even with walker and technician assistance.  I return to the bedside and discussed this with patient and the wife and recommend observation admission and they are agreeable. [DS]    Clinical Course User Index [DS] Arline Bennett, MD     FINAL CLINICAL IMPRESSION(S) / ED DIAGNOSES   Final diagnoses:  Fall, initial encounter  Generalized weakness  Hypotension, unspecified hypotension type     Rx / DC Orders   ED Discharge Orders     None        Note:  This document was prepared using Dragon voice recognition software and may include unintentional dictation errors.   Arline Bennett, MD 01/24/24 [phone]

## 2024-01-24 NOTE — ED Notes (Signed)
Pt given warm blanket and gingerale 

## 2024-01-24 NOTE — Assessment & Plan Note (Signed)
-   Restart home Eliquis 

## 2024-01-24 NOTE — ED Notes (Addendum)
 This tech was able to stand and ambulate pt. with the use of a walker. Pt. needed limited assistance to stand and walk around but after a minute of standing pt. Stated shortness of breath and fatigue (on monitor pt. was destating went from 92 to 84). Pt. was able to walk back to bed and needed limited assistance with feet to be back on stretcher.

## 2024-01-24 NOTE — Assessment & Plan Note (Addendum)
 Patient is presenting with supine hypotension with dizziness and shortness of breath on standing, likely due to orthostasis.Per chart review, he has a history of chronic hypotension, however this was while on antihypertensives.  He states that he has stopped all his antihypertensives when he was discharged from the hospital in December 2024 so unclear etiology at this time.  In the setting of anasarca, concerning for severe malnutrition vs cirrhosis. Differential also includes PE given no longer taking Eliquis  and prior DVT. Less likely HF related, however will obtain echo.   - Continue midodrine 5 mg 3 times daily for now - Trial of albumin to avoid further third spacing - D-dimer, TSH, INR - Ultrasound to assess for ascites - Echocardiogram - Hold home diuretic

## 2024-01-24 NOTE — ED Triage Notes (Signed)
 Pt arrives via ems from home from a fall. Ems reports pt c/o dizziness and weakness x3 days. Per ems pt was on the ground for an hour. Ems also reports falling over the last few days. A&Ox4. No blood thinners. No LOC.

## 2024-01-25 ENCOUNTER — Inpatient Hospital Stay
Admit: 2024-01-25 | Discharge: 2024-01-25 | Disposition: A | Discharge status: Home / Self Care | Attending: Internal Medicine | Admitting: Internal Medicine

## 2024-01-25 DIAGNOSIS — I6523 Occlusion and stenosis of bilateral carotid arteries: Secondary | ICD-10-CM | POA: Diagnosis present

## 2024-01-25 DIAGNOSIS — I5022 Chronic systolic (congestive) heart failure: Secondary | ICD-10-CM | POA: Diagnosis present

## 2024-01-25 DIAGNOSIS — K2970 Gastritis, unspecified, without bleeding: Secondary | ICD-10-CM | POA: Diagnosis present

## 2024-01-25 DIAGNOSIS — K2211 Ulcer of esophagus with bleeding: Secondary | ICD-10-CM | POA: Diagnosis present

## 2024-01-25 DIAGNOSIS — R55 Syncope and collapse: Secondary | ICD-10-CM | POA: Diagnosis present

## 2024-01-25 DIAGNOSIS — Z66 Do not resuscitate: Secondary | ICD-10-CM | POA: Diagnosis present

## 2024-01-25 DIAGNOSIS — I110 Hypertensive heart disease with heart failure: Secondary | ICD-10-CM | POA: Diagnosis present

## 2024-01-25 DIAGNOSIS — B182 Chronic viral hepatitis C: Secondary | ICD-10-CM | POA: Diagnosis present

## 2024-01-25 DIAGNOSIS — E440 Moderate protein-calorie malnutrition: Secondary | ICD-10-CM | POA: Diagnosis present

## 2024-01-25 DIAGNOSIS — J9601 Acute respiratory failure with hypoxia: Secondary | ICD-10-CM | POA: Diagnosis present

## 2024-01-25 DIAGNOSIS — J449 Chronic obstructive pulmonary disease, unspecified: Secondary | ICD-10-CM | POA: Diagnosis present

## 2024-01-25 DIAGNOSIS — I081 Rheumatic disorders of both mitral and tricuspid valves: Secondary | ICD-10-CM | POA: Diagnosis present

## 2024-01-25 DIAGNOSIS — L89151 Pressure ulcer of sacral region, stage 1: Secondary | ICD-10-CM | POA: Diagnosis not present

## 2024-01-25 DIAGNOSIS — M7989 Other specified soft tissue disorders: Secondary | ICD-10-CM | POA: Diagnosis present

## 2024-01-25 DIAGNOSIS — D6832 Hemorrhagic disorder due to extrinsic circulating anticoagulants: Secondary | ICD-10-CM | POA: Diagnosis present

## 2024-01-25 DIAGNOSIS — K264 Chronic or unspecified duodenal ulcer with hemorrhage: Secondary | ICD-10-CM | POA: Diagnosis present

## 2024-01-25 DIAGNOSIS — K861 Other chronic pancreatitis: Secondary | ICD-10-CM | POA: Diagnosis present

## 2024-01-25 DIAGNOSIS — R188 Other ascites: Secondary | ICD-10-CM | POA: Diagnosis present

## 2024-01-25 DIAGNOSIS — Z7901 Long term (current) use of anticoagulants: Secondary | ICD-10-CM | POA: Diagnosis not present

## 2024-01-25 DIAGNOSIS — I480 Paroxysmal atrial fibrillation: Secondary | ICD-10-CM | POA: Diagnosis present

## 2024-01-25 DIAGNOSIS — E8809 Other disorders of plasma-protein metabolism, not elsewhere classified: Secondary | ICD-10-CM | POA: Diagnosis present

## 2024-01-25 DIAGNOSIS — Z96659 Presence of unspecified artificial knee joint: Secondary | ICD-10-CM | POA: Diagnosis present

## 2024-01-25 DIAGNOSIS — D62 Acute posthemorrhagic anemia: Secondary | ICD-10-CM | POA: Diagnosis present

## 2024-01-25 DIAGNOSIS — I951 Orthostatic hypotension: Secondary | ICD-10-CM | POA: Diagnosis present

## 2024-01-25 DIAGNOSIS — I959 Hypotension, unspecified: Secondary | ICD-10-CM | POA: Diagnosis not present

## 2024-01-25 LAB — CBC WITH DIFFERENTIAL/PLATELET
Abs Immature Granulocytes: 0.03 10*3/uL (ref 0.00–0.07)
Basophils Absolute: 0.1 10*3/uL (ref 0.0–0.1)
Basophils Relative: 1 %
Eosinophils Absolute: 0.1 10*3/uL (ref 0.0–0.5)
Eosinophils Relative: 2 %
HCT: 29.8 % — ABNORMAL LOW (ref 39.0–52.0)
Hemoglobin: 9.8 g/dL — ABNORMAL LOW (ref 13.0–17.0)
Immature Granulocytes: 0 %
Lymphocytes Relative: 18 %
Lymphs Abs: 1.3 10*3/uL (ref 0.7–4.0)
MCH: 29.3 pg (ref 26.0–34.0)
MCHC: 32.9 g/dL (ref 30.0–36.0)
MCV: 89 fL (ref 80.0–100.0)
Monocytes Absolute: 0.5 10*3/uL (ref 0.1–1.0)
Monocytes Relative: 7 %
Neutro Abs: 5.3 10*3/uL (ref 1.7–7.7)
Neutrophils Relative %: 72 %
Platelets: 231 10*3/uL (ref 150–400)
RBC: 3.35 MIL/uL — ABNORMAL LOW (ref 4.22–5.81)
RDW: 13.8 % (ref 11.5–15.5)
WBC: 7.3 10*3/uL (ref 4.0–10.5)
nRBC: 0 % (ref 0.0–0.2)

## 2024-01-25 LAB — COMPREHENSIVE METABOLIC PANEL WITH GFR
ALT: 12 U/L (ref 0–44)
AST: 27 U/L (ref 15–41)
Albumin: 1.5 g/dL — ABNORMAL LOW (ref 3.5–5.0)
Alkaline Phosphatase: 73 U/L (ref 38–126)
Anion gap: 3 — ABNORMAL LOW (ref 5–15)
BUN: 9 mg/dL (ref 8–23)
CO2: 24 mmol/L (ref 22–32)
Calcium: 7.6 mg/dL — ABNORMAL LOW (ref 8.9–10.3)
Chloride: 110 mmol/L (ref 98–111)
Creatinine, Ser: 1.24 mg/dL (ref 0.61–1.24)
GFR, Estimated: >60 mL/min (ref >60)
Glucose, Bld: 76 mg/dL (ref 70–99)
Potassium: 4.1 mmol/L (ref 3.5–5.1)
Sodium: 137 mmol/L (ref 135–145)
Total Bilirubin: 0.7 mg/dL (ref 0.0–1.2)
Total Protein: 4.4 g/dL — ABNORMAL LOW (ref 6.5–8.1)

## 2024-01-25 LAB — PROTIME-INR
INR: 1.3 — ABNORMAL HIGH (ref 0.8–1.2)
Prothrombin Time: 16.7 s — ABNORMAL HIGH (ref 11.4–15.2)

## 2024-01-25 LAB — GLUCOSE, CAPILLARY
Glucose-Capillary: 68 mg/dL — ABNORMAL LOW (ref 70–99)
Glucose-Capillary: 79 mg/dL (ref 70–99)

## 2024-01-25 LAB — MAGNESIUM: Magnesium: 1.5 mg/dL — ABNORMAL LOW (ref 1.7–2.4)

## 2024-01-25 MED ORDER — MAGNESIUM SULFATE 4 GM/100ML IV SOLN
4.0000 g | Freq: Once | INTRAVENOUS | Status: AC
  Administered 2024-01-25: 4 g via INTRAVENOUS
  Filled 2024-01-25: qty 100

## 2024-01-25 MED ORDER — VITAMIN C 500 MG PO TABS
500.0000 mg | ORAL_TABLET | Freq: Two times a day (BID) | ORAL | Status: DC
  Administered 2024-01-25 – 2024-01-28 (×4): 500 mg via ORAL
  Filled 2024-01-25 (×4): qty 1

## 2024-01-25 MED ORDER — ENSURE ENLIVE PO LIQD
237.0000 mL | Freq: Three times a day (TID) | ORAL | Status: DC
  Administered 2024-01-26 – 2024-01-28 (×4): 237 mL via ORAL

## 2024-01-25 NOTE — Progress Notes (Signed)
 Echocardiogram 2D Echocardiogram has been performed.  Karl Norman 01/25/2024, 6:09 PM

## 2024-01-25 NOTE — Progress Notes (Addendum)
 Progress Note   Patient: Karl Norman ZOX:[id number] DOB: 1946-08-13 DOA: 01/24/2024     0 DOS: the patient was seen and examined on 01/25/2024   Brief hospital course: No notes on file  Assessment and Plan: * Hypotension Patient is presenting with supine hypotension with dizziness and shortness of breath on standing, likely due to orthostasis.Per chart review, he has a history of chronic hypotension, however this was while on antihypertensives.  He states that he has stopped all his antihypertensives when he was discharged from the hospital in December 2024 so unclear etiology at this time.  In the setting of anasarca and ascites, concerning for severe malnutrition vs cirrhosis. CTA w/o PE . Less likely HF related, TTE 08/2023 with EF in nl range.    - Continue midodrine 5 mg 3 times daily for now - Ultrasound to assess for ascites - Echocardiogram - Hold home diuretic  Possible cirrhosis  Anasarca on exam. Albumin 1.6. INR 1.3. Mod ascites noted on US . Has a history of chronic hepatitis C. Lower suspicion for acute HF given nl BNP and recent TTE 08/2023 with nl EF.    - Given some abdominal discomfort c/s IR for paracentesis  - Dietitian consulted  Acute hypoxic respiratory failure (HCC) Patient experiences shortness of breath with hypoxia on minimal exertion.  Given lack of abnormalities on chest x-ray and examination, potentially severe deconditioning +/- atelectasis. Currently not requiring O2 at rest. CTA negative for PE.  Trace bilateral pleural effusions noted. Mild emphysematous changes noted.   - Will need to check ambulatory oxygen saturation once hypotension is improved - Supplemental oxygen as needed - Incentive spirometry  Normocytic anemia    Latest Ref Rng & Units 01/25/2024    6:21 AM 01/24/2024   12:10 PM 09/19/2023    5:49 AM  CBC  WBC 4.0 - 10.5 K/uL 7.3  10.4  9.1   Hemoglobin 13.0 - 17.0 g/dL 9.8  81.1  91.4   Hematocrit 39.0 - 52.0 % 29.8  37.5  33.5    Platelets 150 - 400 K/uL 231  283  259    Check iron panel.   Generalized weakness Patient states that he has been experiencing worsening generalized weakness for several months now.  This is likely multifactorial, however driven by significantly sedentary lifestyle and deconditioning.  - PT/OT  Chronic systolic CHF (congestive heart failure), NYHA class 2 (HCC) Patient has a history of HFrEF with EF as low as 25% with recovery on most recent echocardiogram in December 2024 demonstrating EF of 50-55%.  Moderate mitral valve and tricuspid valve regurg noted at that time.  Diffuse anasarca on exam, however low suspicion this is due to heart failure.  - Daily weights - Strict ins and out  Paroxysmal A-fib (HCC) - Continue home Eliquis  --Rate control medication limited due to low blood pressures.   Hypomagnesium Supplement, likely due to poor nutrition.      Subjective: Feels breathing is improved. Has some abdominal discomfort. Eager to get back home.   Physical Exam: Vitals:   01/24/24 2002 01/25/24 0406 01/25/24 0828 01/25/24 1353  BP: 107/66 (!) 105/58 (!) 107/52 (!) 84/23  Pulse: (!) 103 96 (!) 53 91  Resp: 18 18 16 16   Temp: 97.9 F (36.6 C) 97.9 F (36.6 C) (!) 97.4 F (36.3 C) 98.7 F (37.1 C)  TempSrc: Oral Oral    SpO2: 99% 97% 92% 94%  Weight:      Height:       Physical  Exam  Constitutional: In no distress. Chronically ill appearing Cardiovascular: Normal rate, regular rhythm. Up to hips lower extremity edema  Pulmonary: Non labored breathing on room air, no wheezing or rales.  a Abdominal: Soft. Normal bowel sounds. Non distended and non tender Musculoskeletal: Diffuse edema.    Neurological: Alert and oriented to person, place, and time. Non focal  Skin: Skin is warm and dry. Has ecchymosis on BLE and some abrasions.   Data Reviewed:     Latest Ref Rng & Units 01/25/2024    6:21 AM 01/24/2024   12:10 PM 09/19/2023    5:49 AM  CMP  Glucose 70 - 99  mg/dL 76  95  82   BUN 8 - 23 mg/dL 9  8  11    Creatinine 0.61 - 1.24 mg/dL 8.11  9.14  7.82   Sodium 135 - 145 mmol/L 137  138  136   Potassium 3.5 - 5.1 mmol/L 4.1  4.4  4.5   Chloride 98 - 111 mmol/L 110  110  108   CO2 22 - 32 mmol/L 24  21  20    Calcium  8.9 - 10.3 mg/dL 7.6  7.6  6.9   Total Protein 6.5 - 8.1 g/dL 4.4  5.2    Total Bilirubin 0.0 - 1.2 mg/dL 0.7  0.6    Alkaline Phos 38 - 126 U/L 73  96    AST 15 - 41 U/L 27  28    ALT 0 - 44 U/L 12  14      Family Communication: None  Disposition: Status is: Inpatient Remains inpatient appropriate because: further work up of hypotension and dyspnea/hypoxia   Planned Discharge Destination: Pending     Time spent: 35 minutes  Author: Joette Mustard, MD 01/25/2024 8:40 PM  For on call review [email or web address].

## 2024-01-25 NOTE — Progress Notes (Signed)
 Hypoglycemic Event  CBG: 68  Treatment: 4 oz juice/soda  Symptoms: None  Follow-up CBG: Time:0930 CBG Result:79  Possible Reasons for Event: Unknown  Comments/MD notified:Breakfast ordered and at the bedside.     Karl Norman

## 2024-01-25 NOTE — Plan of Care (Signed)

## 2024-01-25 NOTE — Progress Notes (Signed)
 Initial Nutrition Assessment  DOCUMENTATION CODES:   Non-severe (moderate) malnutrition in context of chronic illness  INTERVENTION:   Ensure Enlive po TID, each supplement provides 350 kcal and 20 grams of protein.  Magic cup TID with meals, each supplement provides 290 kcal and 9 grams of protein  MVI, folic acid and thiamine po daily  Vitamin C 500mg  po BID  Pt at high refeed risk; recommend monitor potassium, magnesium and phosphorus labs daily until stable  Daily weights  High protein, low sodium diet education   Check vitamin D lab  NUTRITION DIAGNOSIS:   Moderate Malnutrition related to chronic illness as evidenced by mild fat depletion, mild muscle depletion.  GOAL:   Patient will meet greater than or equal to 90% of their needs  MONITOR:   PO intake, Supplement acceptance, Labs, Weight trends, Skin, I & O's  REASON FOR ASSESSMENT:   Consult Assessment of nutrition requirement/status  ASSESSMENT:   78 y/o male with h/o PAF s/p cardioversion, CHF, hepatitis C, HTN, GERD, remote etoh abuse (quit 2006), hiatal hernia, chronic pancreatitis with pseudocyst, DVT, gallstones s/p laparoscopic cholecystectomy 03/2023, PVD, empyema s/p pericardial window (2012) and COPD who is admitted with hypotension, falls and anasarca.  Met with pt and pt's wife in room today. Pt reports poor appetite and oral intake at baseline. Pt reports a typical day for him includes no breakfast, a light lunch and a decent dinner. Wife reports that pt eats best at dinner. Pt reports poor appetite and oral intake in hospital. Pt reports that the bridge in his mouth no longer fits well making it difficult for him to eat tough foods. Pt's lunch tray was on his side table with only bites eaten from it; pt did eat a chocolate ice cream. Pt reports that he ate most of his breakfast. RD discussed with pt the importance of adequate nutrition needed to preserve lean muscle. Pt is agreeable to drinking  chocolate Ensure in hospital. RD will add supplements and vitamins to help pt meet his estimated needs. Pt is at high refeed risk. Pt declines a mechanical soft diet. RD will liberalize pt's diet. Provided pt with low sodium/high protein diet education today. Pt meets criteria for moderate malnutrition but unable to get a good exam r/t anasarca. Per chart, pt is up ~29lbs from his last documented chart weight in December. Pt with frequents falls and weakness; will check vitamin D lab. Iron/anemia labs pending.    Medications reviewed and include: MVI, folic acid, thiamine  Labs reviewed: K 4.1 wnl, Mg 1.5(L) Hgb 9.8(L), Hct 29.8(L) Cbgs- 79, 68 x 24 hrs   NUTRITION - FOCUSED PHYSICAL EXAM:  Flowsheet Row Most Recent Value  Orbital Region No depletion  Upper Arm Region Mild depletion  Thoracic and Lumbar Region Mild depletion  Buccal Region No depletion  Temple Region Mild depletion  Clavicle Bone Region Mild depletion  Clavicle and Acromion Bone Region Mild depletion  Scapular Bone Region Mild depletion  Dorsal Hand Mild depletion  Patellar Region Mild depletion  Anterior Thigh Region Unable to assess  Posterior Calf Region Unable to assess  Edema (RD Assessment) Moderate  Hair Reviewed  Eyes Reviewed  Mouth Reviewed  Skin Reviewed  Nails Reviewed   Diet Order:   Diet Order             Diet 2 gram sodium Room service appropriate? Yes; Fluid consistency: Thin  Diet effective now  EDUCATION NEEDS:   Education needs have been addressed  Skin:  Skin Assessment: Reviewed RN Assessment (ecchymosis)  Last BM:  5/6- type 7  Height:   Ht Readings from Last 1 Encounters:  01/24/24 5\' 8"  (1.727 m)    Weight:   Wt Readings from Last 1 Encounters:  01/24/24 86.9 kg    Ideal Body Weight:  70 kg  BMI:  Body mass index is 29.12 kg/m.  Estimated Nutritional Needs:   Kcal:  2000-2300kcal/day  Protein:  100-115g/day  Fluid:  1.8-2.1L/day  Torrance Freestone MS, RD, LDN If unable to be reached, please send secure chat to "RD inpatient" available from 8:00a-4:00p daily

## 2024-01-25 NOTE — Care Management Obs Status (Signed)
 MEDICARE OBSERVATION STATUS NOTIFICATION   Patient Details  Name: Karl Norman MRN: [id number] Date of Birth: [birth year]   Medicare Observation Status Notification Given:  Yes    Anise Kerns 01/25/2024, 1:31 PM

## 2024-01-26 ENCOUNTER — Inpatient Hospital Stay

## 2024-01-26 DIAGNOSIS — L8990 Pressure ulcer of unspecified site, unspecified stage: Secondary | ICD-10-CM | POA: Insufficient documentation

## 2024-01-26 DIAGNOSIS — E440 Moderate protein-calorie malnutrition: Secondary | ICD-10-CM | POA: Insufficient documentation

## 2024-01-26 DIAGNOSIS — R042 Hemoptysis: Secondary | ICD-10-CM | POA: Insufficient documentation

## 2024-01-26 DIAGNOSIS — I959 Hypotension, unspecified: Secondary | ICD-10-CM | POA: Diagnosis not present

## 2024-01-26 DIAGNOSIS — K861 Other chronic pancreatitis: Secondary | ICD-10-CM | POA: Insufficient documentation

## 2024-01-26 LAB — ECHOCARDIOGRAM LIMITED
AR max vel: 2.18 cm2
AV Peak grad: 7.8 mmHg
Ao pk vel: 1.4 m/s
Area-P 1/2: 5.02 cm2
Height: 68 in
MV M vel: 4.89 m/s
MV Peak grad: 95.6 mmHg
S' Lateral: 2.8 cm
Weight: 3064 [oz_av]

## 2024-01-26 LAB — BODY FLUID CELL COUNT WITH DIFFERENTIAL
Eos, Fluid: 0 %
Lymphs, Fluid: 3 %
Monocyte-Macrophage-Serous Fluid: 27 %
Neutrophil Count, Fluid: 70 %
Total Nucleated Cell Count, Fluid: 301 uL

## 2024-01-26 LAB — ALBUMIN, PLEURAL OR PERITONEAL FLUID: Albumin, Fluid: <1.5 g/dL

## 2024-01-26 LAB — PROTEIN, PLEURAL OR PERITONEAL FLUID: Total protein, fluid: <3 g/dL

## 2024-01-26 LAB — COMPREHENSIVE METABOLIC PANEL WITH GFR
ALT: 15 U/L (ref 0–44)
AST: 33 U/L (ref 15–41)
Albumin: 1.6 g/dL — ABNORMAL LOW (ref 3.5–5.0)
Alkaline Phosphatase: 81 U/L (ref 38–126)
Anion gap: 4 — ABNORMAL LOW (ref 5–15)
BUN: 10 mg/dL (ref 8–23)
CO2: 24 mmol/L (ref 22–32)
Calcium: 7.5 mg/dL — ABNORMAL LOW (ref 8.9–10.3)
Chloride: 112 mmol/L — ABNORMAL HIGH (ref 98–111)
Creatinine, Ser: 1.18 mg/dL (ref 0.61–1.24)
GFR, Estimated: >60 mL/min (ref >60)
Glucose, Bld: 118 mg/dL — ABNORMAL HIGH (ref 70–99)
Potassium: 4.8 mmol/L (ref 3.5–5.1)
Sodium: 140 mmol/L (ref 135–145)
Total Bilirubin: 0.6 mg/dL (ref 0.0–1.2)
Total Protein: 4.7 g/dL — ABNORMAL LOW (ref 6.5–8.1)

## 2024-01-26 LAB — CBC
HCT: 30.3 % — ABNORMAL LOW (ref 39.0–52.0)
HCT: 33.4 % — ABNORMAL LOW (ref 39.0–52.0)
Hemoglobin: 9.9 g/dL — ABNORMAL LOW (ref 13.0–17.0)
Hemoglobin: 11.1 g/dL — ABNORMAL LOW (ref 13.0–17.0)
MCH: 29.4 pg (ref 26.0–34.0)
MCH: 29.8 pg (ref 26.0–34.0)
MCHC: 32.7 g/dL (ref 30.0–36.0)
MCHC: 33.2 g/dL (ref 30.0–36.0)
MCV: 89.9 fL (ref 80.0–100.0)
MCV: 89.8 fL (ref 80.0–100.0)
Platelets: 256 10*3/uL (ref 150–400)
Platelets: 220 10*3/uL (ref 150–400)
RBC: 3.37 MIL/uL — ABNORMAL LOW (ref 4.22–5.81)
RBC: 3.72 MIL/uL — ABNORMAL LOW (ref 4.22–5.81)
RDW: 13.8 % (ref 11.5–15.5)
RDW: 13.6 % (ref 11.5–15.5)
WBC: 9.7 10*3/uL (ref 4.0–10.5)
WBC: 10.2 10*3/uL (ref 4.0–10.5)
nRBC: 0 % (ref 0.0–0.2)
nRBC: 0 % (ref 0.0–0.2)

## 2024-01-26 LAB — MAGNESIUM: Magnesium: 2.1 mg/dL (ref 1.7–2.4)

## 2024-01-26 LAB — VITAMIN D 25 HYDROXY (VIT D DEFICIENCY, FRACTURES): Vit D, 25-Hydroxy: 30.5 ng/mL (ref 30–100)

## 2024-01-26 LAB — PHOSPHORUS: Phosphorus: 3.5 mg/dL (ref 2.5–4.6)

## 2024-01-26 LAB — MRSA NEXT GEN BY PCR, NASAL: MRSA by PCR Next Gen: NOT DETECTED

## 2024-01-26 LAB — IRON AND TIBC: Iron: 61 ug/dL (ref 45–182)

## 2024-01-26 LAB — PROTEIN / CREATININE RATIO, URINE
Creatinine, Urine: 159 mg/dL
Protein Creatinine Ratio: 0.18 mg/mg{creat} — ABNORMAL HIGH (ref 0.00–0.15)
Total Protein, Urine: 28 mg/dL

## 2024-01-26 LAB — GLUCOSE, CAPILLARY
Glucose-Capillary: 103 mg/dL — ABNORMAL HIGH (ref 70–99)
Glucose-Capillary: 98 mg/dL (ref 70–99)

## 2024-01-26 LAB — FOLATE: Folate: >40 ng/mL (ref >5.9)

## 2024-01-26 LAB — FERRITIN: Ferritin: 188 ng/mL (ref 24–336)

## 2024-01-26 LAB — VITAMIN B12: Vitamin B-12: 1102 pg/mL — ABNORMAL HIGH (ref 180–914)

## 2024-01-26 MED ORDER — PANTOPRAZOLE SODIUM 40 MG IV SOLR: 40.0000 mg | Freq: Two times a day (BID) | INTRAVENOUS | Status: DC

## 2024-01-26 MED ORDER — PROTHROMBIN COMPLEX CONC HUMAN 500 UNITS IV KIT
4500.0000 [IU] | PACK | Status: AC
  Administered 2024-01-26: 4500 [IU] via INTRAVENOUS
  Filled 2024-01-26: qty 500

## 2024-01-26 MED ORDER — SODIUM CHLORIDE 0.9 % IV SOLN: INTRAVENOUS | Status: DC

## 2024-01-26 MED ORDER — ALBUMIN HUMAN 25 % IV SOLN
12.5000 g | Freq: Once | INTRAVENOUS | Status: AC
  Administered 2024-01-26: 12.5 g via INTRAVENOUS
  Filled 2024-01-26: qty 50

## 2024-01-26 MED ORDER — SODIUM CHLORIDE 0.9% IV SOLUTION: Freq: Once | INTRAVENOUS | Status: DC

## 2024-01-26 MED ORDER — NOREPINEPHRINE 4 MG/250ML-% IV SOLN: 0.0000 ug/min | INTRAVENOUS | Status: DC

## 2024-01-26 MED ORDER — CHLORHEXIDINE GLUCONATE CLOTH 2 % EX PADS
6.0000 | MEDICATED_PAD | Freq: Every day | CUTANEOUS | Status: DC
  Administered 2024-01-26 – 2024-01-27 (×2): 6 via TOPICAL

## 2024-01-26 MED ORDER — LIDOCAINE HCL (PF) 1 % IJ SOLN
6.0000 mL | Freq: Once | INTRAMUSCULAR | Status: AC
  Administered 2024-01-26: 6 mL via INTRADERMAL
  Filled 2024-01-26: qty 6

## 2024-01-26 MED ORDER — SODIUM CHLORIDE 0.9 % IV SOLN
1.0000 g | INTRAVENOUS | Status: DC
  Administered 2024-01-26 – 2024-01-28 (×3): 1 g via INTRAVENOUS
  Filled 2024-01-26 (×3): qty 10

## 2024-01-26 MED ORDER — SODIUM CHLORIDE 0.9 % IV SOLN
50.0000 ug/h | INTRAVENOUS | Status: DC
  Administered 2024-01-26 – 2024-01-27 (×3): 50 ug/h via INTRAVENOUS
  Filled 2024-01-26 (×6): qty 1

## 2024-01-26 MED ORDER — IOHEXOL 350 MG/ML SOLN
100.0000 mL | Freq: Once | INTRAVENOUS | Status: AC | PRN
  Administered 2024-01-26: 100 mL via INTRAVENOUS

## 2024-01-26 MED ORDER — SODIUM CHLORIDE 0.9 % IV BOLUS
500.0000 mL | Freq: Once | INTRAVENOUS | Status: AC
  Administered 2024-01-26: 500 mL via INTRAVENOUS

## 2024-01-26 MED ORDER — SODIUM CHLORIDE 0.9 % IV SOLN
12.5000 mg | Freq: Once | INTRAVENOUS | Status: AC
  Administered 2024-01-26: 12.5 mg via INTRAVENOUS
  Filled 2024-01-26: qty 12.5

## 2024-01-26 MED ORDER — PANTOPRAZOLE SODIUM 40 MG IV SOLR
40.0000 mg | Freq: Three times a day (TID) | INTRAVENOUS | Status: DC
  Administered 2024-01-26 – 2024-01-27 (×4): 40 mg via INTRAVENOUS
  Filled 2024-01-26 (×4): qty 10

## 2024-01-26 MED ORDER — PANTOPRAZOLE SODIUM 40 MG IV SOLR
40.0000 mg | INTRAVENOUS | Status: AC
  Administered 2024-01-26 (×2): 40 mg via INTRAVENOUS
  Filled 2024-01-26 (×2): qty 10

## 2024-01-26 NOTE — Progress Notes (Addendum)
 Progress Note   Patient: Karl Norman:[id number] DOB: [birth year] DOA: 01/24/2024     1 DOS: the patient was seen and examined on 01/26/2024   Brief hospital course:  Karl Norman is a 78 y.o. male with medical history significant of hypertension, chronic systolic heart failure with recovered EF, paroxysmal atrial fibrillation, chronic hepatitis C, chronic pancreatitis, COPD, DVT, choledocholithiasis, who presents to the ED due to ground-level fall.   Karl Norman states that over the last few days, he has been noticing shortness of breath when standing with some dizziness.  He states that overall, this is not unusual for him as he has experienced this in the past.  Earlier today, he stood up from the commode and took 1 step and suddenly fell.  He denies loss of consciousness.  He states that it happened so quickly, he is not sure if he had dizziness or not.  At this time, he denies any chest pain, palpitations or shortness of breath.  He denies any recent illnesses.  He endorses severe lower extremity swelling for which he takes Lasix  as needed.  He states that sometimes it helps, other times it does not.  He has noticed some swelling of his abdomen but denies distention.   Karl Norman states that since his multiple complications after cholecystectomy last year, he has been essentially sedentary.  He states that he lives between his recliner and the bathroom.  His appetite is unchanged.  He feels that his severe lower extremity swelling has contributed to his sedentary lifestyle.  He has not been taking Eliquis  since his discharge in December 2024.   Assessment and Plan:  # Hemoptysis Overnight in setting of resumption of apixaban . Relatively unremarkable egd in 2023. May have new cirrhosis. Received kcentra and 1 unit PRBCs - on ppi and octreotide - start ceftriaxone  - I have reached out to GI who say they were unaware of the patient - wasn't made npo overnight, will order that  # Ascites #  Anasarca With significant hypoalbuminemia, hx hcv but no prior dx of cirrhosis. Hx chronic pancreatitis, malabsorption may be an issue - paracentesis ordered though no lab evaluation was ordered, I have changed orders such that labs will be drawn - hold in diuresis for the time being given bleeding - f/u urine protein  # Hypotension Likely mutifactorial - continue midodrine  # HFrecoveredEF # Mitral regurg, moderate # Tricuspid regurg, moderate to severe With edema - f/u TTE - diuresis when bleeding controlled - may need to involve cardiology  # Acute hypoxic respiratory failure Stable on 2 liters, denies respiratory symptoms. Hemoptysis overnight, "frothy" debris seen on CT of chest. May have aspirated but if so asymptomatic. CTA neg for PE - will hold on abx and monitor (though is on ceftriaxone  for above bleeding)  # Chronic pancreatitis # HCV - gi to see  # A-fib Wasn't taking apixaban  last several months, was restarted here and now hemoptysis. Kcentra given - holding anticoagulationi - rate controlled off meds  # Decline in functional status Overall prognosis appears poor, has had significant decline in functional status last 6 months - will consider palliative consult but first will pursue above workup to gain better clarity on underlying conditions  # Debility - PT/OT consults  # Malnutrition severe      Subjective: no further hemotysis, no abd pain, no dyspnea/cough  Physical Exam: Vitals:   01/26/24 0700 01/26/24 0800 01/26/24 0900 01/26/24 1000  BP: 101/72 110/88 102/67 (!) 101/49  Pulse:  98 (!) 102 93  Resp: 10 12 12 14   Temp:  98.6 F (37 C) 98.6 F (37 C)   TempSrc:  Oral Oral   SpO2:  94% 95% 95%  Weight:      Height:       Physical Exam  Constitutional: In no distress. Chronically ill appearing Cardiovascular: Normal rate, regular rhythm.    Pulmonary: normal wob, rales at bases, otherwise clear Abdominal: Soft. Nontender.  distended Musculoskeletal: Diffuse edema LEs   Neurological: Alert and oriented to person, place, and time. Non focal  Skin: Skin is warm and dry. Has ecchymosis on BLE and some abrasions.   Data Reviewed:     Latest Ref Rng & Units 01/26/2024    1:43 AM 01/25/2024    6:21 AM 01/24/2024   12:10 PM  CMP  Glucose 70 - 99 mg/dL 161  76  95   BUN 8 - 23 mg/dL 10  9  8    Creatinine 0.61 - 1.24 mg/dL 0.96  0.45  4.09   Sodium 135 - 145 mmol/L 140  137  138   Potassium 3.5 - 5.1 mmol/L 4.8  4.1  4.4   Chloride 98 - 111 mmol/L 112  110  110   CO2 22 - 32 mmol/L 24  24  21    Calcium  8.9 - 10.3 mg/dL 7.5  7.6  7.6   Total Protein 6.5 - 8.1 g/dL 4.7  4.4  5.2   Total Bilirubin 0.0 - 1.2 mg/dL 0.6  0.7  0.6   Alkaline Phos 38 - 126 U/L 81  73  96   AST 15 - 41 U/L 33  27  28   ALT 0 - 44 U/L 15  12  14      Family Communication: wife at bedside 5/7  Disposition: Status is: Inpatient Remains inpatient appropriate because: severity of illness   Planned Discharge Destination: tbd    CRITICAL CARE Performed by: Raymonde Calico   Total critical care time: 54 minutes  Critical care time was exclusive of separately billable procedures and treating other patients.  Critical care was necessary to treat or prevent imminent or life-threatening deterioration.  Critical care was time spent personally by me on the following activities: development of treatment plan with patient and/or surrogate as well as nursing, discussions with consultants, evaluation of patient's response to treatment, examination of patient, obtaining history from patient or surrogate, ordering and performing treatments and interventions, ordering and review of laboratory studies, ordering and review of radiographic studies, pulse oximetry and re-evaluation of patient's condition.   Author: Raymonde Calico, MD 01/26/2024 10:49 AM  For on call review [email or web address].

## 2024-01-26 NOTE — Significant Event (Addendum)
 Rapid Response Event Note   Reason for Call :  Hematemesis and hypotension  Initial Focused Assessment:  On arrival to Patients room patient alert and oriented complaining of nausea. Emesis bag with 450mls of Maroon emesis. Patients Vitals   BP 99/71  HR 96 RR 17 O2 100% on RA/ Provider Alonza Arthurs NP at bedside. Finished lab Draw and patient was transferred to ICU     Interventions:  -Labs -Albumin -Kcentra -CTA -Levo if needed  MD Notified:  Ouma NP Call Time: 0115 Arrival Time:0118 End Time:0130  Georgana Kilts, RN

## 2024-01-26 NOTE — Progress Notes (Signed)
 PT Cancellation Note  Patient Details Name: COLBY ROGAN MRN: [id number] DOB: [birth year]   Cancelled Treatment:    Reason Eval/Treat Not Completed: Other (comment): Per nursing pt receiving paracentesis at this time and requested PT eval to be held until 5/8.  Will attempt to see pt at a future date/time as medically appropriate.    Lavenia Post PT, DPT 01/26/24, 3:22 PM

## 2024-01-26 NOTE — Evaluation (Signed)
 Occupational Therapy Evaluation Patient Details Name: Karl Norman MRN: [id number] DOB: 06/16/1946 Today's Date: 01/26/2024   History of Present Illness   Pt is a 78 year old male presented to ED due to [facility]; work up includes hemoptysis, hypotension, acute hypoxic respiratory failure, a fib    PMH significant for hypertension, chronic systolic heart failure with recovered EF, paroxysmal atrial fibrillation, chronic hepatitis C, chronic pancreatitis, COPD, DVT, choledocholithiasis     Clinical Impressions Chart reviewed, pt greeted semi supine in bed with wife present, alert and oriented x4, agreeable to OT evaluation. PTA pt has had a decrease in functional performance approx the last 6 months, now currently amb short household distances with either SPC or RW, multiple reported fall and report pt has difficulties getting in/out of stairs therefore he does not leave the house much. Pt requires assist for LB dressing/bathing currently. Pt presents with deficits in strength, endurance, activity tolerance, balance, affecting safe and optimal ADL completion. Recommend acute OT to facilitate optimal ADL performance. Discussed discharge recommendations, safe ADL performance with DME with pt/wife. Pt is left in modified chair position, all needs met. OT will continue to follow  Of note: dizziness reported upon sitting on edge of bed, 66/45 but ?reading, taken again and 111/82 dizziness resolved within 30 seconds, nurse notified. Family reports pt does wear compression stockings at home at times.     If plan is discharge home, recommend the following:   A little help with walking and/or transfers;A little help with bathing/dressing/bathroom;Assistance with cooking/housework;Help with stairs or ramp for entrance     Functional Status Assessment   Patient has had a recent decline in their functional status and demonstrates the ability to make significant improvements in function in a reasonable and  predictable amount of time.     Equipment Recommendations   Wheelchair cushion (measurements OT)     Recommendations for Other Services         Precautions/Restrictions   Precautions Precautions: Fall Recall of Precautions/Restrictions: Intact Restrictions Weight Bearing Restrictions Per Provider Order: No     Mobility Bed Mobility Overal bed mobility: Needs Assistance Bed Mobility: Supine to Sit, Sit to Supine     Supine to sit: Min assist, HOB elevated, Used rails Sit to supine: Min assist, Used rails   General bed mobility comments: assist for BLE    Transfers Overall transfer level: Needs assistance Equipment used: Rolling walker (2 wheels) Transfers: Sit to/from Stand Sit to Stand: Min assist           General transfer comment: intermittent vcs for technique, lateral steps up the bed with RW to the L      Balance Overall balance assessment: Needs assistance Sitting-balance support: Feet supported Sitting balance-Leahy Scale: Good     Standing balance support: Bilateral upper extremity supported, During functional activity, Reliant on assistive device for balance Standing balance-Leahy Scale: Fair                             ADL either performed or assessed with clinical judgement   ADL Overall ADL's : Needs assistance/impaired     Grooming: Set up;Bed level               Lower Body Dressing: Maximal assistance Lower Body Dressing Details (indicate cue type and reason): socks     Toileting- Clothing Manipulation and Hygiene: Maximal assistance Toileting - Clothing Manipulation Details (indicate cue type and reason): anticipate  Vision Patient Visual Report: No change from baseline       Perception         Praxis         Pertinent Vitals/Pain Pain Assessment Pain Assessment: No/denies pain     Extremity/Trunk Assessment Upper Extremity Assessment Upper Extremity Assessment: Generalized  weakness;RUE deficits/detail;LUE deficits/detail RUE Deficits / Details: edema noted throughout BUE/BLE   Lower Extremity Assessment Lower Extremity Assessment: Generalized weakness       Communication Communication Communication: No apparent difficulties   Cognition Arousal: Alert Behavior During Therapy: WFL for tasks assessed/performed Cognition: No apparent impairments                               Following commands: Intact       Cueing  General Comments   Cueing Techniques: Verbal cues  vss throughout on 3L via [place],bruising and edema noted on BUE   Exercises Other Exercises Other Exercises: edu pt/wife re: role of OT, role of rehab, discharge recommendations, importance of progressing mobility   Shoulder Instructions      Home Living Family/patient expects to be discharged to:: Private residence Living Arrangements: Spouse/significant other Available Help at Discharge: Family;Available 24 hours/day Type of Home: House Home Access: Stairs to enter [employer] of Steps: 4 Entrance Stairs-Rails: Right;Left Home Layout: One level     Bathroom Shower/Tub: Producer, television/film/video: Handicapped height Bathroom Accessibility: Yes   Home Equipment: Agricultural consultant (2 wheels);Cane - single point;Crutches;BSC/3in1;Shower seat;Transport chair   Additional Comments: report difficulites with transport chair as pt is unable to push himself      Prior Functioning/Environment Prior Level of Function : Needs assist             Mobility Comments: amb with SPC, RW limited household distances; multiple fall history in the last six months, difficulties with the steps, doesn't leave the house much ADLs Comments: wife assists with dressing, bathing (specifically LB tasks), set up-MOD I for grooming/feeding tasks; assist for IADL from family    OT Problem List: Decreased strength;Decreased range of motion;Decreased activity  tolerance;Impaired balance (sitting and/or standing);Decreased safety awareness;Decreased knowledge of use of DME or AE   OT Treatment/Interventions: Self-care/ADL training;Therapeutic exercise;Patient/family education;Balance training;Energy conservation;Therapeutic activities;DME and/or AE instruction      OT Goals(Current goals can be found in the care plan section)   Acute Rehab OT Goals Patient Stated Goal: go to my grandkids sports games OT Goal Formulation: With patient/family Time For Goal Achievement: 02/09/24 Potential to Achieve Goals: Good ADL Goals Pt Will Perform Grooming: with supervision;sitting;standing Pt Will Perform Lower Body Dressing: with supervision;sit to/from stand;bed level Pt Will Transfer to Toilet: with supervision;ambulating Pt Will Perform Toileting - Clothing Manipulation and hygiene: with supervision;sitting/lateral leans;sit to/from stand   OT Frequency:  Min 2X/week    Co-evaluation              AM-PAC OT "6 Clicks" Daily Activity     Outcome Measure Help from another person eating meals?: None Help from another person taking care of personal grooming?: None Help from another person toileting, which includes using toliet, bedpan, or urinal?: A Lot Help from another person bathing (including washing, rinsing, drying)?: A Lot Help from another person to put on and taking off regular upper body clothing?: A Little Help from another person to put on and taking off regular lower body clothing?: A Lot 6 Click Score: 17   End  of Session Equipment Utilized During Treatment: Rolling walker (2 wheels);Oxygen Nurse Communication: Mobility status  Activity Tolerance: Patient tolerated treatment well Patient left: in bed;with call bell/phone within reach;with bed alarm set;with family/visitor present;with nursing/sitter in room (in modified chair position)  OT Visit Diagnosis: Other abnormalities of gait and mobility (R26.89);History of falling  (Z91.81);Unsteadiness on feet (R26.81)                Time: 8119-1478 OT Time Calculation (min): 26 min Charges:  OT General Charges $OT Visit: 1 Visit OT Evaluation $OT Eval Moderate Complexity: 1 Mod  Gerre Kraft, OTD OTR/L  01/26/24, 2:54 PM

## 2024-01-26 NOTE — Procedures (Signed)
 PROCEDURE SUMMARY:  Successful image-guided paracentesis from the right abdomen.  Yielded 2.85 liters of clear, straw-colored peritoneal fluid.  No immediate complications.  EBL: zero Patient tolerated well.   Specimen was sent for labs.  Please see imaging section of Epic for full dictation.  Gordy Lauber Zaydee Aina PA-C 01/26/2024 4:13 PM

## 2024-01-26 NOTE — Consult Note (Signed)
 GI Inpatient Consult Note  Reason for Consult: Hematemesis    Attending Requesting Consult: Dr. Hines Ludwig, MD  History of Present Illness: Karl Norman is a 78 y.o. male seen for evaluation of hematemesis/UGIB at the request of hospitalist - Dr. Hines Ludwig. Patient has a PMH of chronic pancreatitis s/p removal of pancreatic stones and stenting with stent removed, chronic HCV, chronic systolic CHF, PAF, hx of gallstone pancreatitis s/p ERCP x6 s/p cholecystectomy 03/26/2023 c/b intra-abdominal abscess, PVD, COPD, and OA. He presented to the [facility] 5/5 via EMS after a fall. He was admitted for hypotension, generalized weakness, and acute hypoxic respiratory failure. Rapid response called last night for hematemesis where it was reported ~450 cc maroon colored emesis. He was transfused 1 unit pRBCs and received Kcentra. He was started on Octreotide bolus and gtt, Ceftriaxone , and PPI. STAT CTA performed showed no signs of active GI bleeding, but did comment on colitis in ascending and transverse colon. Hemoglobin was 9.9 overnight with repeat hemoglobin 11.1 this morning. Orders in for diagnostic and therapeutic LVP given findings of significant abdominopelvic ascites and anasarca. There is questions of possible cirrhosis given reported history of chronic HCV (untreated), hypoalbuminemia, and finding of ascites. GI consulted for further evaluation and management.   Patient seen and examined this afternoon resting in ICU bed. His wife is bedside. He has not had any further episodes of hematemesis since the overnight event. He feels at baseline currently. He is concerned about the fluid build up in his legs and abdomen. He has a LVP scheduled for later this afternoon. He denies any complaints of abdominal pain, abdominal cramping, nausea, vomiting, dysphagia, chest pain, or shortness of breath. He is fairly sedentary at home and reports he seems to go between his recliner and bathroom and bedroom most of the  day. He was recently re-started on Eliquis  here in the hospital. He reports he was not taking Eliquis  as an outpatient due to it costing >$400. He takes ibuprofen occasionally at home for aches and pains. He denies any previous diagnosis of cirrhosis.    Last EGD/CSY:  CSY 01/20/2022 ([place]) - fair colon prep, normal examined colon  EGD 01/20/2022 ([facility]) - one tongue of salmon-colored mucosa present, small hiatal hernia, normal stomach, normal duodenum   Past Medical History:  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   Pancreatitis    Pneumatouria     Problem List: Patient Active Problem List   Diagnosis Date Noted   Malnutrition of moderate degree 01/26/2024   Pressure injury of skin 01/26/2024   Chronic pancreatitis (HCC) 01/26/2024   Hemoptysis 01/26/2024   Near syncope 01/25/2024   Acute hypoxic respiratory failure (HCC) 01/24/2024   Hypotension 01/24/2024   Generalized weakness 01/24/2024   Anasarca 01/24/2024   Postoperative intra-abdominal abscess (HCC) 09/17/2023   AKI (acute kidney injury) (HCC) 09/17/2023   Severe sepsis with acute organ dysfunction (HCC) 09/17/2023   Paroxysmal A-fib (HCC) 09/17/2023   Leukocytosis 09/17/2023   Chronic obstructive  pulmonary disease, unspecified (HCC) 09/21/2022   Chronic systolic CHF (congestive heart failure), NYHA class 2 (HCC) 12/24/2020   Acute exacerbation of CHF (congestive heart failure) (HCC) 10/22/2020   Heart failure (HCC) 10/21/2020   Benign essential HTN 03/10/2016   Bilateral carotid artery  stenosis 10/11/2015   Severe mitral insufficiency 10/11/2015   Cellulitis 09/18/2015   GERD (gastroesophageal reflux disease) 08/21/2013   Hepatitis C 08/21/2013    Past Surgical History: Past Surgical History:  Procedure Laterality Date   CARDIOVERSION N/A 10/24/2020   Procedure: CARDIOVERSION;  Surgeon: Michelle Aid, MD;  Location: [facility] ORS;  Service: Cardiovascular;  Laterality: N/A;   COLONOSCOPY WITH PROPOFOL  N/A 08/15/2018   Procedure: COLONOSCOPY WITH PROPOFOL ;  Surgeon: Cassie Click, MD;  Location: [facility] ENDOSCOPY;  Service: Endoscopy;  Laterality: N/A;   COLONOSCOPY WITH PROPOFOL  N/A 01/20/2022   Procedure: COLONOSCOPY WITH PROPOFOL ;  Surgeon: Shane Darling, MD;  Location: [facility] ENDOSCOPY;  Service: Endoscopy;  Laterality: N/A;   ERCP with stent  07/22/2018   ESOPHAGOGASTRODUODENOSCOPY     6 times   ESOPHAGOGASTRODUODENOSCOPY (EGD) WITH PROPOFOL  N/A 01/20/2022   Procedure: ESOPHAGOGASTRODUODENOSCOPY (EGD) WITH PROPOFOL ;  Surgeon: Shane Darling, MD;  Location: [facility] ENDOSCOPY;  Service: Endoscopy;  Laterality: N/A;   REPLACEMENT TOTAL KNEE     TEE WITHOUT CARDIOVERSION N/A 11/11/2015   Procedure: TRANSESOPHAGEAL ECHOCARDIOGRAM (TEE);  Surgeon: Michelle Aid, MD;  Location: [facility] ORS;  Service: Cardiovascular;  Laterality: N/A;   TEE WITHOUT CARDIOVERSION N/A 10/24/2020   Procedure: TRANSESOPHAGEAL ECHOCARDIOGRAM (TEE);  Surgeon: Michelle Aid, MD;  Location: [facility] ORS;  Service: Cardiovascular;  Laterality: N/A;    Allergies: Allergies  Allergen Reactions   Penicillins     Has patient had a PCN reaction causing immediate rash, facial/tongue/throat swelling, SOB or lightheadedness with hypotension: NO Has patient had a PCN reaction causing severe rash involving mucus membranes or skin necrosis: YES Has patient had a PCN reaction that required hospitalization: YES Has patient had a PCN reaction occurring within the last 10 years: NO If all of the above  answers are "NO", then may proceed with Cephalosporin use.     Home Medications: Medications Prior to Admission  Medication Sig Dispense Refill Last Dose/Taking   furosemide  (LASIX ) 40 MG tablet Take 1 tablet (40 mg total) by mouth ONCE OR TWICE daily (total daily dose maximum 80 mg) as needed for up to 3 days for increased leg swelling, shortness of breath, weight gain 5+ lbs over 1-2 days. Seek medical care if these symptoms are not improving with increased dose.   Taking   loperamide (IMODIUM) 2 MG capsule Take 4 mg by mouth 2 (two) times daily.   Taking   omeprazole (PRILOSEC) 20 MG capsule Take 20 mg by mouth daily.   Taking   ondansetron  (ZOFRAN ) 4 MG tablet Take 4 mg by mouth every 8 (eight) hours as needed for nausea.   Taking As Needed   simethicone  (MYLICON) 80 MG chewable tablet Chew 80 mg by mouth every 6 (six) hours as needed for flatulence.   Taking As Needed   valsartan  (DIOVAN ) 80 MG tablet Take 80 mg by mouth daily.   Past Week   apixaban  (ELIQUIS ) 5 MG TABS tablet Take 1 tablet (5 mg total) by mouth 2 (two) times daily. (Patient not taking: Reported on 01/24/2024) 60 tablet 3 Not Taking   b complex vitamins capsule Take 1 capsule by mouth daily. (Patient not taking: Reported on 09/17/2023)   Not Taking  chlorhexidine  (HIBICLENS ) 4 % external liquid Apply topically daily as needed (wound cleansing). (Patient not taking: Reported on 01/24/2024) 532 mL 0 Not Taking   ferrous sulfate 324 MG TBEC Take 324 mg by mouth daily as needed. (Patient not taking: Reported on 09/17/2023)   Not Taking   ibuprofen (ADVIL) 200 MG tablet Take 400 mg by mouth every 6 (six) hours as needed for mild pain. (Patient not taking: Reported on 09/17/2023)   Not Taking   metoprolol  tartrate (LOPRESSOR ) 25 MG tablet Take 0.5 tablets (12.5 mg total) by mouth 2 (two) times daily. HOLD THIS MEDICINE if blood pressure less than 110 top number and/or less than 70 bottom number (Patient not taking: Reported on 01/24/2024)    Not Taking   sulfamethoxazole -trimethoprim  (BACTRIM  DS) 800-160 MG tablet Take 1 tablet by mouth 2 (two) times daily. (Patient not taking: Reported on 01/24/2024) 14 tablet 0 Not Taking   vitamin B-12 (CYANOCOBALAMIN ) 1000 MCG tablet Take 1,000 mcg by mouth daily. (Patient not taking: Reported on 09/17/2023)   Not Taking   Home medication reconciliation was completed with the patient.   Scheduled Inpatient Medications:    sodium chloride    Intravenous Once   vitamin C  500 mg Oral BID   Chlorhexidine  Gluconate Cloth  6 each Topical QHS   feeding supplement  237 mL Oral TID BM   folic acid  1 mg Oral Daily   midodrine  5 mg Oral TID WC   multivitamin with minerals  1 tablet Oral Daily   pantoprazole  (PROTONIX ) IV  40 mg Intravenous Q8H   Followed by   Cecily Cohen ON 01/29/2024] pantoprazole  (PROTONIX ) IV  40 mg Intravenous Q12H   sodium chloride  flush  3 mL Intravenous Q12H   thiamine  100 mg Oral Daily    Continuous Inpatient Infusions:    cefTRIAXone  (ROCEPHIN )  IV 1 g (01/26/24 1112)   octreotide (SANDOSTATIN) 500 mcg in sodium chloride  0.9 % 250 mL (2 mcg/mL) infusion 50 mcg/hr (01/26/24 1353)    PRN Inpatient Medications:  acetaminophen  **OR** acetaminophen , loperamide, ondansetron  **OR** ondansetron  (ZOFRAN ) IV, polyethylene glycol  Family History: family history includes Heart failure in his father; Leukemia in his brother.  The patient's family history is negative for inflammatory bowel disorders, GI malignancy, or solid organ transplantation.  Social History:   reports that he has quit smoking. He has never used smokeless tobacco. He reports current alcohol use. He reports that he does not currently use drugs. The patient denies ETOH, tobacco, or drug use.   Review of Systems: Constitutional: + weight loss Eyes: No changes in vision. ENT: No oral lesions, sore throat.  GI: see HPI.  Heme/Lymph: No easy bruising.  CV: No chest pain.  GU: No hematuria.  Integumentary: No  rashes.  Neuro: No headaches.  Psych: No depression/anxiety.  Endocrine: No heat/cold intolerance.  Allergic/Immunologic: No urticaria.  Resp: No cough, SOB.  Musculoskeletal: No joint swelling.    Physical Examination: BP 109/73   Pulse 90   Temp 98.7 F (37.1 C) (Oral)   Resp 14   Ht 5\' 9"  (1.753 m)   Wt 84.4 kg   SpO2 96%   BMI 27.48 kg/m  Gen: NAD, alert and oriented x 4 HEENT: PEERLA, EOMI, Neck: supple, no JVD or thyromegaly Chest: CTA bilaterally, no wheezes, crackles, or other adventitious sounds CV: RRR, no m/g/c/r Abd: soft, NT, ND, +BS in all four quadrants; no HSM, guarding, ridigity, or rebound tenderness Ext: bilateral edema present Skin: no rash  or lesions noted Lymph: no LAD  Data: Lab Results  Component Value Date   WBC 10.2 01/26/2024   HGB 11.1 (L) 01/26/2024   HCT 33.4 (L) 01/26/2024   MCV 89.8 01/26/2024   PLT 220 01/26/2024   Recent Labs  Lab 01/25/24 0621 01/26/24 0143 01/26/24 1056  HGB 9.8* 9.9* 11.1*   Lab Results  Component Value Date   NA 140 01/26/2024   K 4.8 01/26/2024   CL 112 (H) 01/26/2024   CO2 24 01/26/2024   BUN 10 01/26/2024   CREATININE 1.18 01/26/2024   Lab Results  Component Value Date   ALT 15 01/26/2024   AST 33 01/26/2024   ALKPHOS 81 01/26/2024   BILITOT 0.6 01/26/2024   Recent Labs  Lab 01/25/24 0621  INR 1.3*   CTA abd/pelvis with and without contrast 01/26/2024: IMPRESSION: 1. No signs of intraluminal contrast extravasation to suggest active GI bleed. 2. Wall thickening involving the decompressed ascending colon and transverse colon, which may reflect incomplete distension versus inflammatory or infectious colitis. Clinical correlation advised. 3. Moderate volume of abdominopelvic ascites is identified which appears new from the previous exam.Marked diffuse body wall edema 4. Marked diffuse hepatic steatosis. 5. Small bilateral pleural effusions. 6.  Aortic Atherosclerosis (ICD10-I70.0).      Assessment/Plan:  78 y/o Caucasian male with a PMH of chronic pancreatitis s/p removal of pancreatic stones and stenting with stent removed, chronic HCV, chronic systolic CHF, PAF, hx of gallstone pancreatitis s/p ERCP x6 s/p cholecystectomy 03/26/2023 c/b intra-abdominal abscess, PVD, COPD, and OA presented to the [facility] ED 5/5 earlier this week after fall. He is admitted for hypotension, anasarca, and acute hypoxic respiratory failure. GI consulted this morning for overnight event of hematemesis concerning for UGIB.   Hematemesis/UGIB  Ascites/Anasarca   Acute hypoxic respiratory failure  Moderate malnutrition   Chronic pancreatitis  Chronic HCV  PAF  Functional decline   Recommendations:  - Maintain 2 large bore IVs for access - Continue to monitor serial H&H. Transfuse for Hgb <8.0.  - No signs of overt GIB. Continue to monitor for signs of bleeding.  - Continue supportive care per primary team  - OK to continue Octreotide gtt and Protonix  gtt and Ceftriaxone   - Ascites/anasarca could be 2/2 severe malnutrition. There is no previous radiographic evidence of cirrhosis of the liver.  - Follow-up on results of diagnostic paracentesis collected this afternoon - Advise EGD for endoluminal evaluation.  - Hold Eliquis  and all antiplatelet therapy  - Full liquid diet OK for now. NPO after midnight.  - EGD tomorrow AM with Dr. Corky Diener - See procedure note for findings and further recs  I reviewed the risks (including bleeding, perforation, infection, anesthesia complications, cardiac/respiratory complications), benefits and alternatives of EGD. Patient consents to proceed.    Thank you for the consult. Please call with questions or concerns.  Landa Pine, PA-C [facility] [phone]

## 2024-01-26 NOTE — Progress Notes (Signed)
Dear Doctor:  This patient has been identified as a candidate for PICC for the following reason (s): poor veins/poor circulatory system (CHF, COPD, emphysema, diabetes, steroid use, IV drug abuse, etc.) If you agree, please write an order for the indicated device. For any questions contact the Vascular Access Team at [phone] if no answer, please leave a message.  Thank you for supporting the early vascular access assessment program. 

## 2024-01-26 NOTE — H&P (View-Only) (Signed)
 GI Inpatient Consult Note  Reason for Consult: Hematemesis    Attending Requesting Consult: Dr. Hines Ludwig, MD  History of Present Illness: Karl Norman is a 78 y.o. male seen for evaluation of hematemesis/UGIB at the request of hospitalist - Dr. Hines Ludwig. Patient has a PMH of chronic pancreatitis s/p removal of pancreatic stones and stenting with stent removed, chronic HCV, chronic systolic CHF, PAF, hx of gallstone pancreatitis s/p ERCP x6 s/p cholecystectomy 03/26/2023 c/b intra-abdominal abscess, PVD, COPD, and OA. He presented to the [facility] 5/5 via EMS after a fall. He was admitted for hypotension, generalized weakness, and acute hypoxic respiratory failure. Rapid response called last night for hematemesis where it was reported ~450 cc maroon colored emesis. He was transfused 1 unit pRBCs and received Kcentra. He was started on Octreotide bolus and gtt, Ceftriaxone , and PPI. STAT CTA performed showed no signs of active GI bleeding, but did comment on colitis in ascending and transverse colon. Hemoglobin was 9.9 overnight with repeat hemoglobin 11.1 this morning. Orders in for diagnostic and therapeutic LVP given findings of significant abdominopelvic ascites and anasarca. There is questions of possible cirrhosis given reported history of chronic HCV (untreated), hypoalbuminemia, and finding of ascites. GI consulted for further evaluation and management.   Patient seen and examined this afternoon resting in ICU bed. His wife is bedside. He has not had any further episodes of hematemesis since the overnight event. He feels at baseline currently. He is concerned about the fluid build up in his legs and abdomen. He has a LVP scheduled for later this afternoon. He denies any complaints of abdominal pain, abdominal cramping, nausea, vomiting, dysphagia, chest pain, or shortness of breath. He is fairly sedentary at home and reports he seems to go between his recliner and bathroom and bedroom most of the  day. He was recently re-started on Eliquis  here in the hospital. He reports he was not taking Eliquis  as an outpatient due to it costing >$400. He takes ibuprofen occasionally at home for aches and pains. He denies any previous diagnosis of cirrhosis.    Last EGD/CSY:  CSY 01/20/2022 ([place]) - fair colon prep, normal examined colon  EGD 01/20/2022 ([place]) - one tongue of salmon-colored mucosa present, small hiatal hernia, normal stomach, normal duodenum   Past Medical History:  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   Pancreatitis    Pneumatouria     Problem List: Patient Active Problem List   Diagnosis Date Noted   Malnutrition of moderate degree 01/26/2024   Pressure injury of skin 01/26/2024   Chronic pancreatitis (HCC) 01/26/2024   Hemoptysis 01/26/2024   Near syncope 01/25/2024   Acute hypoxic respiratory failure (HCC) 01/24/2024   Hypotension 01/24/2024   Generalized weakness 01/24/2024   Anasarca 01/24/2024   Postoperative intra-abdominal abscess (HCC) 09/17/2023   AKI (acute kidney injury) (HCC) 09/17/2023   Severe sepsis with acute organ dysfunction (HCC) 09/17/2023   Paroxysmal A-fib (HCC) 09/17/2023   Leukocytosis 09/17/2023   Chronic obstructive  pulmonary disease, unspecified (HCC) 09/21/2022   Chronic systolic CHF (congestive heart failure), NYHA class 2 (HCC) 12/24/2020   Acute exacerbation of CHF (congestive heart failure) (HCC) 10/22/2020   Heart failure (HCC) 10/21/2020   Benign essential HTN 03/10/2016   Bilateral carotid artery  stenosis 10/11/2015   Severe mitral insufficiency 10/11/2015   Cellulitis 09/18/2015   GERD (gastroesophageal reflux disease) 08/21/2013   Hepatitis C 08/21/2013    Past Surgical History: Past Surgical History:  Procedure Laterality Date   CARDIOVERSION N/A 10/24/2020   Procedure: CARDIOVERSION;  Surgeon: Michelle Aid, MD;  Location: [facility] ORS;  Service: Cardiovascular;  Laterality: N/A;   COLONOSCOPY WITH PROPOFOL  N/A 08/15/2018   Procedure: COLONOSCOPY WITH PROPOFOL ;  Surgeon: Cassie Click, MD;  Location: [facility] ENDOSCOPY;  Service: Endoscopy;  Laterality: N/A;   COLONOSCOPY WITH PROPOFOL  N/A 01/20/2022   Procedure: COLONOSCOPY WITH PROPOFOL ;  Surgeon: Shane Darling, MD;  Location: [facility] ENDOSCOPY;  Service: Endoscopy;  Laterality: N/A;   ERCP with stent  07/22/2018   ESOPHAGOGASTRODUODENOSCOPY     6 times   ESOPHAGOGASTRODUODENOSCOPY (EGD) WITH PROPOFOL  N/A 01/20/2022   Procedure: ESOPHAGOGASTRODUODENOSCOPY (EGD) WITH PROPOFOL ;  Surgeon: Shane Darling, MD;  Location: [facility] ENDOSCOPY;  Service: Endoscopy;  Laterality: N/A;   REPLACEMENT TOTAL KNEE     TEE WITHOUT CARDIOVERSION N/A 11/11/2015   Procedure: TRANSESOPHAGEAL ECHOCARDIOGRAM (TEE);  Surgeon: Michelle Aid, MD;  Location: [facility] ORS;  Service: Cardiovascular;  Laterality: N/A;   TEE WITHOUT CARDIOVERSION N/A 10/24/2020   Procedure: TRANSESOPHAGEAL ECHOCARDIOGRAM (TEE);  Surgeon: Michelle Aid, MD;  Location: [facility] ORS;  Service: Cardiovascular;  Laterality: N/A;    Allergies: Allergies  Allergen Reactions   Penicillins     Has patient had a PCN reaction causing immediate rash, facial/tongue/throat swelling, SOB or lightheadedness with hypotension: NO Has patient had a PCN reaction causing severe rash involving mucus membranes or skin necrosis: YES Has patient had a PCN reaction that required hospitalization: YES Has patient had a PCN reaction occurring within the last 10 years: NO If all of the above  answers are "NO", then may proceed with Cephalosporin use.     Home Medications: Medications Prior to Admission  Medication Sig Dispense Refill Last Dose/Taking   furosemide  (LASIX ) 40 MG tablet Take 1 tablet (40 mg total) by mouth ONCE OR TWICE daily (total daily dose maximum 80 mg) as needed for up to 3 days for increased leg swelling, shortness of breath, weight gain 5+ lbs over 1-2 days. Seek medical care if these symptoms are not improving with increased dose.   Taking   loperamide (IMODIUM) 2 MG capsule Take 4 mg by mouth 2 (two) times daily.   Taking   omeprazole (PRILOSEC) 20 MG capsule Take 20 mg by mouth daily.   Taking   ondansetron  (ZOFRAN ) 4 MG tablet Take 4 mg by mouth every 8 (eight) hours as needed for nausea.   Taking As Needed   simethicone  (MYLICON) 80 MG chewable tablet Chew 80 mg by mouth every 6 (six) hours as needed for flatulence.   Taking As Needed   valsartan  (DIOVAN ) 80 MG tablet Take 80 mg by mouth daily.   Past Week   apixaban  (ELIQUIS ) 5 MG TABS tablet Take 1 tablet (5 mg total) by mouth 2 (two) times daily. (Patient not taking: Reported on 01/24/2024) 60 tablet 3 Not Taking   b complex vitamins capsule Take 1 capsule by mouth daily. (Patient not taking: Reported on 09/17/2023)   Not Taking  chlorhexidine  (HIBICLENS ) 4 % external liquid Apply topically daily as needed (wound cleansing). (Patient not taking: Reported on 01/24/2024) 532 mL 0 Not Taking   ferrous sulfate 324 MG TBEC Take 324 mg by mouth daily as needed. (Patient not taking: Reported on 09/17/2023)   Not Taking   ibuprofen (ADVIL) 200 MG tablet Take 400 mg by mouth every 6 (six) hours as needed for mild pain. (Patient not taking: Reported on 09/17/2023)   Not Taking   metoprolol  tartrate (LOPRESSOR ) 25 MG tablet Take 0.5 tablets (12.5 mg total) by mouth 2 (two) times daily. HOLD THIS MEDICINE if blood pressure less than 110 top number and/or less than 70 bottom number (Patient not taking: Reported on 01/24/2024)    Not Taking   sulfamethoxazole -trimethoprim  (BACTRIM  DS) 800-160 MG tablet Take 1 tablet by mouth 2 (two) times daily. (Patient not taking: Reported on 01/24/2024) 14 tablet 0 Not Taking   vitamin B-12 (CYANOCOBALAMIN ) 1000 MCG tablet Take 1,000 mcg by mouth daily. (Patient not taking: Reported on 09/17/2023)   Not Taking   Home medication reconciliation was completed with the patient.   Scheduled Inpatient Medications:    sodium chloride    Intravenous Once   vitamin C  500 mg Oral BID   Chlorhexidine  Gluconate Cloth  6 each Topical QHS   feeding supplement  237 mL Oral TID BM   folic acid  1 mg Oral Daily   midodrine  5 mg Oral TID WC   multivitamin with minerals  1 tablet Oral Daily   pantoprazole  (PROTONIX ) IV  40 mg Intravenous Q8H   Followed by   Cecily Cohen ON 01/29/2024] pantoprazole  (PROTONIX ) IV  40 mg Intravenous Q12H   sodium chloride  flush  3 mL Intravenous Q12H   thiamine  100 mg Oral Daily    Continuous Inpatient Infusions:    cefTRIAXone  (ROCEPHIN )  IV 1 g (01/26/24 1112)   octreotide (SANDOSTATIN) 500 mcg in sodium chloride  0.9 % 250 mL (2 mcg/mL) infusion 50 mcg/hr (01/26/24 1353)    PRN Inpatient Medications:  acetaminophen  **OR** acetaminophen , loperamide, ondansetron  **OR** ondansetron  (ZOFRAN ) IV, polyethylene glycol  Family History: family history includes Heart failure in his father; Leukemia in his brother.  The patient's family history is negative for inflammatory bowel disorders, GI malignancy, or solid organ transplantation.  Social History:   reports that he has quit smoking. He has never used smokeless tobacco. He reports current alcohol use. He reports that he does not currently use drugs. The patient denies ETOH, tobacco, or drug use.   Review of Systems: Constitutional: + weight loss Eyes: No changes in vision. ENT: No oral lesions, sore throat.  GI: see HPI.  Heme/Lymph: No easy bruising.  CV: No chest pain.  GU: No hematuria.  Integumentary: No  rashes.  Neuro: No headaches.  Psych: No depression/anxiety.  Endocrine: No heat/cold intolerance.  Allergic/Immunologic: No urticaria.  Resp: No cough, SOB.  Musculoskeletal: No joint swelling.    Physical Examination: BP 109/73   Pulse 90   Temp 98.7 F (37.1 C) (Oral)   Resp 14   Ht 5\' 9"  (1.753 m)   Wt 84.4 kg   SpO2 96%   BMI 27.48 kg/m  Gen: NAD, alert and oriented x 4 HEENT: PEERLA, EOMI, Neck: supple, no JVD or thyromegaly Chest: CTA bilaterally, no wheezes, crackles, or other adventitious sounds CV: RRR, no m/g/c/r Abd: soft, NT, ND, +BS in all four quadrants; no HSM, guarding, ridigity, or rebound tenderness Ext: bilateral edema present Skin: no rash  or lesions noted Lymph: no LAD  Data: Lab Results  Component Value Date   WBC 10.2 01/26/2024   HGB 11.1 (L) 01/26/2024   HCT 33.4 (L) 01/26/2024   MCV 89.8 01/26/2024   PLT 220 01/26/2024   Recent Labs  Lab 01/25/24 0621 01/26/24 0143 01/26/24 1056  HGB 9.8* 9.9* 11.1*   Lab Results  Component Value Date   NA 140 01/26/2024   K 4.8 01/26/2024   CL 112 (H) 01/26/2024   CO2 24 01/26/2024   BUN 10 01/26/2024   CREATININE 1.18 01/26/2024   Lab Results  Component Value Date   ALT 15 01/26/2024   AST 33 01/26/2024   ALKPHOS 81 01/26/2024   BILITOT 0.6 01/26/2024   Recent Labs  Lab 01/25/24 0621  INR 1.3*   CTA abd/pelvis with and without contrast 01/26/2024: IMPRESSION: 1. No signs of intraluminal contrast extravasation to suggest active GI bleed. 2. Wall thickening involving the decompressed ascending colon and transverse colon, which may reflect incomplete distension versus inflammatory or infectious colitis. Clinical correlation advised. 3. Moderate volume of abdominopelvic ascites is identified which appears new from the previous exam.Marked diffuse body wall edema 4. Marked diffuse hepatic steatosis. 5. Small bilateral pleural effusions. 6.  Aortic Atherosclerosis (ICD10-I70.0).      Assessment/Plan:  78 y/o Caucasian male with a PMH of chronic pancreatitis s/p removal of pancreatic stones and stenting with stent removed, chronic HCV, chronic systolic CHF, PAF, hx of gallstone pancreatitis s/p ERCP x6 s/p cholecystectomy 03/26/2023 c/b intra-abdominal abscess, PVD, COPD, and OA presented to the [facility] ED 5/5 earlier this week after fall. He is admitted for hypotension, anasarca, and acute hypoxic respiratory failure. GI consulted this morning for overnight event of hematemesis concerning for UGIB.   Hematemesis/UGIB  Ascites/Anasarca   Acute hypoxic respiratory failure  Moderate malnutrition   Chronic pancreatitis  Chronic HCV  PAF  Functional decline   Recommendations:  - Maintain 2 large bore IVs for access - Continue to monitor serial H&H. Transfuse for Hgb <8.0.  - No signs of overt GIB. Continue to monitor for signs of bleeding.  - Continue supportive care per primary team  - OK to continue Octreotide gtt and Protonix  gtt and Ceftriaxone   - Ascites/anasarca could be 2/2 severe malnutrition. There is no previous radiographic evidence of cirrhosis of the liver.  - Follow-up on results of diagnostic paracentesis collected this afternoon - Advise EGD for endoluminal evaluation.  - Hold Eliquis  and all antiplatelet therapy  - Full liquid diet OK for now. NPO after midnight.  - EGD tomorrow AM with Dr. Corky Diener - See procedure note for findings and further recs  I reviewed the risks (including bleeding, perforation, infection, anesthesia complications, cardiac/respiratory complications), benefits and alternatives of EGD. Patient consents to proceed.    Thank you for the consult. Please call with questions or concerns.  Landa Pine, PA-C [facility] [phone]

## 2024-01-26 NOTE — Progress Notes (Signed)
     CROSS COVER NOTE  NAME: Karl Norman MRN: [id number] DOB : Jul 15, 1946 ATTENDING PHYSICIAN: Joette Mustard, MD    Date of Service   01/26/2024   HPI/Events of Note   Rapid response called for massive hemoptysis. Patient earlier received scheduled Eliquis  for hx of PAF.  Interventions   Assessment/Plan:  Acute Upper GI Bleed Acute Blood Loss Anemia Hematemesis, Ddx Peptic Ulcer Disease (H. Pylori vs. NSAID), Gastritis, Variceal Bleed, MW Tear, AVM, less likely malignancy  -IVF resuscitation to maintain MAP>65 -Discontinue Eliquis  and Administer Kcentra for Eliquis  reversal -H&H monitoring q6h -Transfuse PRN Hgb<8 -Pantoprazole  80mg  IV x1 then gtt 8mg /hr -Transfer to ICU, high risk for decompensation/Intubation if excessive Hematemesis) -NPO  -GI Consult for EGD/Colonoscopy -CT angio to isolate source  -Hold NSAIDs, steroids, ASA -SCD's for VTE Prophylaxis (chemical ppx contraindicated)        Alonza Arthurs, DNP, CCRN, FNP-C, AGACNP-BC Acute Care & Family Nurse Practitioner  [facility]  See Amion for personal pager PCCM on call pager [phone] until 7 am

## 2024-01-27 ENCOUNTER — Inpatient Hospital Stay: Admitting: Anesthesiology

## 2024-01-27 ENCOUNTER — Encounter: Payer: Self-pay | Admitting: Student

## 2024-01-27 ENCOUNTER — Encounter
Admission: EM | Disposition: A | Discharge status: Hospice | Payer: Self-pay | Source: Home / Self Care | Attending: Obstetrics and Gynecology

## 2024-01-27 DIAGNOSIS — I959 Hypotension, unspecified: Secondary | ICD-10-CM | POA: Diagnosis not present

## 2024-01-27 HISTORY — PX: ESOPHAGOGASTRODUODENOSCOPY: SHX5428

## 2024-01-27 LAB — CBC
HCT: 32.5 % — ABNORMAL LOW (ref 39.0–52.0)
Hemoglobin: 10.6 g/dL — ABNORMAL LOW (ref 13.0–17.0)
MCH: 29.6 pg (ref 26.0–34.0)
MCHC: 32.6 g/dL (ref 30.0–36.0)
MCV: 90.8 fL (ref 80.0–100.0)
Platelets: 233 10*3/uL (ref 150–400)
RBC: 3.58 MIL/uL — ABNORMAL LOW (ref 4.22–5.81)
RDW: 13.9 % (ref 11.5–15.5)
WBC: 9.8 10*3/uL (ref 4.0–10.5)
nRBC: 0 % (ref 0.0–0.2)

## 2024-01-27 LAB — BASIC METABOLIC PANEL WITH GFR
Anion gap: 6 (ref 5–15)
BUN: 10 mg/dL (ref 8–23)
CO2: 24 mmol/L (ref 22–32)
Calcium: 7.6 mg/dL — ABNORMAL LOW (ref 8.9–10.3)
Chloride: 110 mmol/L (ref 98–111)
Creatinine, Ser: 1.1 mg/dL (ref 0.61–1.24)
GFR, Estimated: >60 mL/min (ref >60)
Glucose, Bld: 112 mg/dL — ABNORMAL HIGH (ref 70–99)
Potassium: 4.3 mmol/L (ref 3.5–5.1)
Sodium: 140 mmol/L (ref 135–145)

## 2024-01-27 LAB — MAGNESIUM: Magnesium: 2 mg/dL (ref 1.7–2.4)

## 2024-01-27 LAB — PHOSPHORUS: Phosphorus: 3.5 mg/dL (ref 2.5–4.6)

## 2024-01-27 SURGERY — EGD (ESOPHAGOGASTRODUODENOSCOPY)
Anesthesia: General

## 2024-01-27 MED ORDER — LIDOCAINE HCL (CARDIAC) PF 100 MG/5ML IV SOSY
PREFILLED_SYRINGE | INTRAVENOUS | Status: DC | PRN
  Administered 2024-01-27: 60 mg via INTRAVENOUS

## 2024-01-27 MED ORDER — LIDOCAINE HCL (PF) 2 % IJ SOLN
INTRAMUSCULAR | Status: AC
  Filled 2024-01-27: qty 5

## 2024-01-27 MED ORDER — PROPOFOL 10 MG/ML IV BOLUS
INTRAVENOUS | Status: DC | PRN
  Administered 2024-01-27: 40 mg via INTRAVENOUS

## 2024-01-27 MED ORDER — PROPOFOL 500 MG/50ML IV EMUL
INTRAVENOUS | Status: DC | PRN
  Administered 2024-01-27: 50 ug/kg/min via INTRAVENOUS

## 2024-01-27 MED ORDER — PANTOPRAZOLE SODIUM 40 MG IV SOLR
40.0000 mg | INTRAVENOUS | Status: DC
  Administered 2024-01-28: 40 mg via INTRAVENOUS
  Filled 2024-01-27: qty 10

## 2024-01-27 MED ORDER — EPHEDRINE SULFATE-NACL 50-0.9 MG/10ML-% IV SOSY
PREFILLED_SYRINGE | INTRAVENOUS | Status: DC | PRN
  Administered 2024-01-27: 10 mg via INTRAVENOUS

## 2024-01-27 MED ORDER — FUROSEMIDE 20 MG PO TABS
20.0000 mg | ORAL_TABLET | Freq: Every day | ORAL | Status: DC
  Administered 2024-01-27 – 2024-01-28 (×2): 20 mg via ORAL
  Filled 2024-01-27 (×2): qty 1

## 2024-01-27 NOTE — Anesthesia Postprocedure Evaluation (Signed)
 Anesthesia Post Note  Patient: Karl Norman  Procedure(s) Performed: EGD (ESOPHAGOGASTRODUODENOSCOPY)  Patient location during evaluation: PACU Anesthesia Type: General Level of consciousness: awake and alert Pain management: pain level controlled Vital Signs Assessment: post-procedure vital signs reviewed and stable Respiratory status: spontaneous breathing, nonlabored ventilation, respiratory function stable and patient connected to nasal cannula oxygen Cardiovascular status: blood pressure returned to baseline and stable Postop Assessment: no apparent nausea or vomiting Anesthetic complications: no   No notable events documented.   Last Vitals:  Vitals:   01/27/24 1216 01/27/24 1306  BP: 99/68 109/63  Pulse: 84   Resp: 16 16  Temp: (!) 36 C (!) 35.6 C  SpO2: 95% 97%    Last Pain:  Vitals:   01/27/24 1306  TempSrc: Temporal  PainSc: 0-No pain                 Zula Hitch

## 2024-01-27 NOTE — Progress Notes (Signed)
 Progress Note   Patient: Karl Norman DOB: 11-28-1945 DOA: 01/24/2024     2 DOS: the patient was seen and examined on 01/27/2024   Brief hospital course:  Karl Norman is a 78 y.o. male with medical history significant of hypertension, chronic systolic heart failure with recovered EF, paroxysmal atrial fibrillation, chronic hepatitis C, chronic pancreatitis, COPD, DVT, choledocholithiasis, who presents to the ED due to ground-level fall.   Karl Norman states that over the last few days, he has been noticing shortness of breath when standing with some dizziness.  He states that overall, this is not unusual for him as he has experienced this in the past.  Earlier today, he stood up from the commode and took 1 step and suddenly fell.  He denies loss of consciousness.  He states that it happened so quickly, he is not sure if he had dizziness or not.  At this time, he denies any chest pain, palpitations or shortness of breath.  He denies any recent illnesses.  He endorses severe lower extremity swelling for which he takes Lasix  as needed.  He states that sometimes it helps, other times it does not.  He has noticed some swelling of his abdomen but denies distention.   Karl Norman states that since his multiple complications after cholecystectomy last year, he has been essentially sedentary.  He states that he lives between his recliner and the bathroom.  His appetite is unchanged.  He feels that his severe lower extremity swelling has contributed to his sedentary lifestyle.  He has not been taking Eliquis  since his discharge in December 2024.   Assessment and Plan:  # Hemoptysis # Esophagitis # Gastritis # Duodenal ulcer Overnight 5/6 in setting of resumption of apixaban .  Received kcentra and 1 unit PRBCs. No further bleeding. Egd today showed esophagitis, gastritis, and non-bleeding duodenal ulcer - cont ppi - cont ceftriaxone  for no  # Ascites # Anasarca With significant hypoalbuminemia,  hx hcv but no prior dx of cirrhosis. Hx chronic pancreatitis, malabsorption may be an issue. Normal platelets, no signs cirrhosis on imaging. GI think likely malnutrition. Paracentesis performed, doesn't meet criteria for sbp (and belly is soft). TTE unremarkable, no significant proteinuria - f/u ascitic fluid culture and cytology - RD consult - will touch base with gi about w/u of protein losing enteropathy - trial start of furosemide  20 daily  # Hypotension Likely mutifactorial - continue midodrine  # HFrecoveredEF # Mitral regurg, moderate # Tricuspid regurg, moderate to severe TTE unremarkable here - trial of lasix  as above  # Acute hypoxic respiratory failure Stable on 2 liters, denies respiratory symptoms. Hemoptysis overnight, "frothy" debris seen on CT of chest. May have aspirated but if so asymptomatic. CTA neg for PE - will hold on abx and monitor (though is on ceftriaxone  for above bleeding) - may need o2 at discharge  # Chronic pancreatitis # HCV - gi has ordered hcv rna  # A-fib Declines anticoagulation - holding anticoagulationi - rate controlled off meds  # Decline in functional status Overall prognosis appears poor, has had significant decline in functional status last 6 months - palliative consult  # Debility - PT advising skilled nursing, patient declines  # Malnutrition Severe - RD consult      Subjective: no further hemotysis, no abd pain, no dyspnea/cough  Physical Exam: Vitals:   01/27/24 1216 01/27/24 1306 01/27/24 1316 01/27/24 1326  BP: 99/68 109/63 99/62 106/63  Pulse: 84 84 99 98  Resp: 16  16 15 19   Temp: (!) 96.8 F (36 C) (!) 96.1 F (35.6 C)    TempSrc: Temporal Temporal    SpO2: 95% 97% 95% 93%  Weight:      Height:       Physical Exam  Constitutional: In no distress. Chronically ill appearing Cardiovascular: Normal rate, regular rhythm.    Pulmonary: normal wob, rales at bases, otherwise clear Abdominal: Soft. Nontender.  distended Musculoskeletal: Diffuse edema LEs   Neurological: Alert and oriented to person, place, and time. Non focal  Skin: Skin is warm and dry. Has ecchymosis on BLE and some abrasions.   Data Reviewed:     Latest Ref Rng & Units 01/27/2024    4:32 AM 01/26/2024    1:43 AM 01/25/2024    6:21 AM  CMP  Glucose 70 - 99 mg/dL 102  725  76   BUN 8 - 23 mg/dL 10  10  9    Creatinine 0.61 - 1.24 mg/dL 3.66  4.40  3.47   Sodium 135 - 145 mmol/L 140  140  137   Potassium 3.5 - 5.1 mmol/L 4.3  4.8  4.1   Chloride 98 - 111 mmol/L 110  112  110   CO2 22 - 32 mmol/L 24  24  24    Calcium  8.9 - 10.3 mg/dL 7.6  7.5  7.6   Total Protein 6.5 - 8.1 g/dL  4.7  4.4   Total Bilirubin 0.0 - 1.2 mg/dL  0.6  0.7   Alkaline Phos 38 - 126 U/L  81  73   AST 15 - 41 U/L  33  27   ALT 0 - 44 U/L  15  12     Family Communication: daughter leslie updated telephonically 5/8  Disposition: Status is: Inpatient Remains inpatient appropriate because: severity of illness   Planned Discharge Destination: tbd      Author: Raymonde Calico, MD 01/27/2024 3:12 PM  For on call review [email or web address].

## 2024-01-27 NOTE — Interval H&P Note (Signed)
 History and Physical Interval Note:  01/27/2024 12:38 PM  Karl Norman  has presented today for surgery, with the diagnosis of UGIB, hematemesis.  The various methods of treatment have been discussed with the patient and family. After consideration of risks, benefits and other options for treatment, the patient has consented to  Procedure(s): EGD (ESOPHAGOGASTRODUODENOSCOPY) (N/A) as a surgical intervention.  The patient's history has been reviewed, patient examined, no change in status, stable for surgery.  I have reviewed the patient's chart and labs.  Questions were answered to the patient's satisfaction.     Swisher, Fuad Forget

## 2024-01-27 NOTE — Anesthesia Preprocedure Evaluation (Signed)
 Anesthesia Evaluation  Patient identified by MRN, date of birth, ID band Patient awake    Reviewed: Allergy & Precautions, H&P , NPO status , Patient's Chart, lab work & pertinent test results, reviewed documented beta blocker date and time   Airway Mallampati: II   Neck ROM: full    Dental  (+) Poor Dentition   Pulmonary shortness of breath and with exertion, COPD, former smoker   Pulmonary exam normal        Cardiovascular Exercise Tolerance: Poor hypertension, On Medications +CHF  Normal cardiovascular exam+ Valvular Problems/Murmurs AI  Rhythm:regular Rate:Normal     Neuro/Psych negative neurological ROS  negative psych ROS   GI/Hepatic ,GERD  Medicated,,(+) Hepatitis -  Endo/Other  negative endocrine ROS    Renal/GU Renal disease  negative genitourinary   Musculoskeletal   Abdominal   Peds  Hematology negative hematology ROS (+)   Anesthesia Other Findings Past Medical History: No date: Arrhythmia     Comment:  atrial fibrillation No date: CHF (congestive heart failure) (HCC)     Comment:  had three tubes to drain heart 2011 No date: Chronic pancreatitis (HCC) No date: COPD (chronic obstructive pulmonary disease) (HCC) No date: DVT (deep vein thrombosis) in pregnancy No date: Dyspnea     Comment:  with exertion No date: GERD (gastroesophageal reflux disease) No date: Hepatitis No date: Hypertension 2017: 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) No date: Pancreatitis No date: Pneumatouria Past Surgical History: 10/24/2020: CARDIOVERSION; N/A     Comment:  Procedure: CARDIOVERSION;  Surgeon: Michelle Aid,               MD;  Location: [facility] ORS;  Service: Cardiovascular;                Laterality: N/A; 08/15/2018: COLONOSCOPY WITH PROPOFOL ; N/A     Comment:  Procedure: COLONOSCOPY WITH PROPOFOL ;  Surgeon: Cassie Click, MD;  Location: [facility]  ENDOSCOPY;  Service:               Endoscopy;  Laterality: N/A; 01/20/2022: COLONOSCOPY WITH PROPOFOL ; N/A     Comment:  Procedure: COLONOSCOPY WITH PROPOFOL ;  Surgeon:               Shane Darling, MD;  Location: [facility] ENDOSCOPY;                Service: Endoscopy;  Laterality: N/A; 07/22/2018: ERCP with stent No date: ESOPHAGOGASTRODUODENOSCOPY     Comment:  6 times 01/20/2022: ESOPHAGOGASTRODUODENOSCOPY (EGD) WITH PROPOFOL ; N/A     Comment:  Procedure: ESOPHAGOGASTRODUODENOSCOPY (EGD) WITH               PROPOFOL ;  Surgeon: Shane Darling, MD;  Location:               [facility] ENDOSCOPY;  Service: Endoscopy;  Laterality: N/A; No date: REPLACEMENT TOTAL KNEE 11/11/2015: TEE WITHOUT CARDIOVERSION; N/A     Comment:  Procedure: TRANSESOPHAGEAL ECHOCARDIOGRAM (TEE);                Surgeon: Michelle Aid, MD;  Location: [facility] ORS;                Service: Cardiovascular;  Laterality: N/A; 10/24/2020: TEE WITHOUT CARDIOVERSION; N/A     Comment:  Procedure: TRANSESOPHAGEAL ECHOCARDIOGRAM (TEE);                Surgeon:  Michelle Aid, MD;  Location: [facility] ORS;                Service: Cardiovascular;  Laterality: N/A; BMI    Body Mass Index: 24.84 kg/m     Reproductive/Obstetrics negative OB ROS                             Anesthesia Physical Anesthesia Plan  ASA: 3 and emergent  Anesthesia Plan: General   Post-op Pain Management:    Induction:   PONV Risk Score and Plan:   Airway Management Planned:   Additional Equipment:   Intra-op Plan:   Post-operative Plan:   Informed Consent: I have reviewed the patients History and Physical, chart, labs and discussed the procedure including the risks, benefits and alternatives for the proposed anesthesia with the patient or authorized representative who has indicated his/her understanding and acceptance.     Dental Advisory Given  Plan Discussed with: CRNA  Anesthesia Plan Comments:         Anesthesia Quick Evaluation

## 2024-01-27 NOTE — Op Note (Signed)
 [facility] Gastroenterology Patient Name: Karl Norman Procedure Date: 01/27/2024 12:39 PM MRN: [id number] Account #: [id number] Date of Birth: 02-19-1946 Admit Type: Inpatient Age: 78 Room: [facility] ENDO ROOM 4 Gender: Male Note Status: Finalized Instrument Name: Upper Endoscope [id number] Procedure:             Upper GI endoscopy Indications:           Acute post hemorrhagic anemia, Hematemesis Providers:             Susen Haskew K. Corky Diener MD, MD Referring MD:          Rosalynn Come. Claudius Cumins, MD (Referring MD) Medicines:             Propofol  per Anesthesia Complications:         No immediate complications. Estimated blood loss:                         Minimal. Procedure:             Pre-Anesthesia Assessment:                        - The risks and benefits of the procedure and the                         sedation options and risks were discussed with the                         patient. All questions were answered and informed                         consent was obtained.                        - Patient identification and proposed procedure were                         verified prior to the procedure by the nurse. The                         procedure was verified in the procedure room.                        - ASA Grade Assessment: III - A patient with severe                         systemic disease.                        - After reviewing the risks and benefits, the patient                         was deemed in satisfactory condition to undergo the                         procedure.                        After obtaining informed consent, the endoscope was  passed under direct vision. Throughout the procedure,                         the patient's blood pressure, pulse, and oxygen                         saturations were monitored continuously. The Endoscope                         was introduced through the mouth, and advanced to the                          third part of duodenum. The upper GI endoscopy was                         accomplished without difficulty. The patient tolerated                         the procedure well. Findings:      LA Grade D (one or more mucosal breaks involving at least 75% of       esophageal circumference) esophagitis with no bleeding was found in the       distal esophagus.      Patchy minimal inflammation characterized by congestion (edema) was       found in the gastric body. Biopsies were taken with a cold forceps for       Helicobacter pylori testing. Estimated blood loss was minimal.      The exam of the stomach was otherwise normal.      One non-bleeding superficial duodenal ulcer with no stigmata of bleeding       was found in the duodenal bulb. The lesion was 8 mm in largest dimension.      The second portion of the duodenum and third portion of the duodenum       were normal.      The exam was otherwise without abnormality. 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                        - Gastritis. Biopsied.                        - Non-bleeding duodenal ulcer with no stigmata of                         bleeding.                        - Normal second portion of the duodenum and third                         portion of the duodenum.                        - The examination was otherwise normal. Recommendation:        - Return patient to ICU for ongoing care.                        -  Use Protonix  (pantoprazole ) 40 mg IV daily.                        - Serial H/H, Serial exams                        - [employer] will sign off for now. Call us  back if any                         changes clinically or if we can help for any reason. Procedure Code(s):     --- Professional ---                        [id number], Esophagogastroduodenoscopy, flexible,                         transoral; with biopsy, single or multiple Diagnosis Code(s):      --- Professional ---                        K92.0, Hematemesis                        D62, Acute posthemorrhagic anemia                        K26.9, Duodenal ulcer, unspecified as acute or                         chronic, without hemorrhage or perforation                        K29.70, Gastritis, unspecified, without bleeding                        K20.80, Other esophagitis without bleeding CPT copyright 2022 American Medical Association. All rights reserved. The codes documented in this report are preliminary and upon coder review may  be revised to meet current compliance requirements. Cassie Click MD, MD 01/27/2024 1:04:04 PM This report has been signed electronically. Number of Addenda: 0 Note Initiated On: 01/27/2024 12:39 PM Estimated Blood Loss:  Estimated blood loss was minimal.      [facility]

## 2024-01-27 NOTE — Transfer of Care (Signed)
 Immediate Anesthesia Transfer of Care Note  Patient: Karl Norman  Procedure(s) Performed: EGD (ESOPHAGOGASTRODUODENOSCOPY)  Patient Location: PACU  Anesthesia Type:General  Level of Consciousness: sedated  Airway & Oxygen Therapy: Patient Spontanous Breathing  Post-op Assessment: Report given to RN and Post -op Vital signs reviewed and stable  Post vital signs: Reviewed and stable  Last Vitals:  Vitals Value Taken Time  BP 109/63 01/27/24 1306  Temp 35.6 C 01/27/24 1306  Pulse    Resp 16 01/27/24 1306  SpO2 97 % 01/27/24 1306    Last Pain:  Vitals:   01/27/24 1306  TempSrc: Temporal  PainSc: 0-No pain      Patients Stated Pain Goal: 0 (01/26/24 0800)  Complications: No notable events documented.

## 2024-01-27 NOTE — Progress Notes (Signed)
 [facility] GI inpatient brief Progress Note  I was asked to comment on patient's low albumin and malnutrition.  Vitals:   01/27/24 1316 01/27/24 1326  BP: 99/62 106/63  Pulse: 99 98  Resp: 15 19  Temp:    SpO2: 95% 93%       Labs:    Latest Ref Rng & Units 01/27/2024    4:32 AM 01/26/2024   10:56 AM 01/26/2024    1:43 AM  CBC  WBC 4.0 - 10.5 K/uL 9.8  10.2  9.7   Hemoglobin 13.0 - 17.0 g/dL 86.5  78.4  9.9   Hematocrit 39.0 - 52.0 % 32.5  33.4  30.3   Platelets 150 - 400 K/uL 233  220  256     CMP     Component Value Date/Time   NA 140 01/27/2024 0432   NA 137 08/31/2013 0958   K 4.3 01/27/2024 0432   K 3.7 08/31/2013 0958   CL 110 01/27/2024 0432   CL 101 08/31/2013 0958   CO2 24 01/27/2024 0432   CO2 26 08/31/2013 0958   GLUCOSE 112 (H) 01/27/2024 0432   GLUCOSE 121 (H) 08/31/2013 0958   BUN 10 01/27/2024 0432   BUN 13 08/31/2013 0958   CREATININE 1.10 01/27/2024 0432   CREATININE 0.82 08/31/2013 0958   CALCIUM  7.6 (L) 01/27/2024 0432   CALCIUM  9.9 08/31/2013 0958   PROT 4.7 (L) 01/26/2024 0143   PROT 8.2 08/31/2013 0958   ALBUMIN 1.6 (L) 01/26/2024 0143   ALBUMIN 3.6 08/31/2013 0958   AST 33 01/26/2024 0143   AST 140 (H) 08/31/2013 0958   ALT 15 01/26/2024 0143   ALT 120 (H) 08/31/2013 0958   ALKPHOS 81 01/26/2024 0143   ALKPHOS 257 (H) 08/31/2013 0958   BILITOT 0.6 01/26/2024 0143   BILITOT 4.9 (H) 08/31/2013 0958   GFRNONAA >60 01/27/2024 0432   GFRNONAA >60 08/31/2013 0958   GFRAA >60 06/22/2018 1225   GFRAA >60 08/31/2013 0958      Impression:   Hypoalbuminemia - Likely related to malnutrition. He does not have diarrhea so protein losing enteropathy is not likely.  2.   Malnutrition - lack of proper fod intake at home may be culprit. 3.   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  4.   Remote history of excessive alcohol consumption. 5.   Ascites from  malnutrition.   Plan:   Outpatient workup can be considered with stool alpha-1-antitrypsin for PLE. 2.    Treat malnutrition. Consider nutrition consult 3.     Continue PPI, may switch to PO pantoprazole  40mg  po bid x 1 month, then one po daily. 4.    Avoid NSAIDs.   Please call back if we can help.  Thank you  T. Nathanael Baker, M.D. ABIM Diplomate in Gastroenterology [facility] [phone] - Cell

## 2024-01-27 NOTE — Evaluation (Signed)
 Physical Therapy Evaluation Patient Details Name: Karl Norman MRN: [id number] DOB: 11-Dec-1945 Today's Date: 01/27/2024  History of Present Illness  Pt is a 78 year old male presented to ED due to [facility]; work up includes hemoptysis, hypotension, acute hypoxic respiratory failure, a fib    PMH significant for hypertension, chronic systolic heart failure with recovered EF, paroxysmal atrial fibrillation, chronic hepatitis C, chronic pancreatitis, COPD, DVT, choledocholithiasis.   Clinical Impression  Pt admitted with above diagnosis. Pt currently with functional limitations due to the deficits listed below (see PT Problem List). Pt received supine in bed agreeable to PT. Reports PTA having difficulty at home due to BLE edema reliant on spouse support for ADL's and various AD's for household ambulation.   To date, pt needing minA and increased time for bed mobility to transfer to EOB. Mild dizziness reported but improves with time in sitting. See below BP readings during transfers and mobility. Pt is able to stand CGA to RW and SPT to recliner then perform additional STS to RW ambulating 6' at bedside without difficulty and grossly limited in progression due to lines/leads management and supplemental O2 cordage. Pt mildly symptomatic reporting mild lightheadedness but is tolerable enough to safely participate with OOB mobility. Pt left with LE's elevated and all needs in reach. Pt will benefit from skilled PT services < 3 hours/day to address his deficits prior to return to home environment.   BP supine: 97/55 mm Hg, HR: 93 BPM BP sitting: 83/69 mm Hg, HR: 99 BPM  BP post SPT to recliner in sitting: 94/77 mm Hg, HR: 104 BPM      If plan is discharge home, recommend the following: A little help with walking and/or transfers;A little help with bathing/dressing/bathroom;Help with stairs or ramp for entrance;Assist for transportation;Assistance with cooking/housework   Can travel by private vehicle    No    Equipment Recommendations Other (comment) (TBD by next venue of care)  Recommendations for Other Services       Functional Status Assessment Patient has had a recent decline in their functional status and demonstrates the ability to make significant improvements in function in a reasonable and predictable amount of time.     Precautions / Restrictions Precautions Precautions: Fall Recall of Precautions/Restrictions: Intact Restrictions Weight Bearing Restrictions Per Provider Order: No      Mobility  Bed Mobility Overal bed mobility: Needs Assistance Bed Mobility: Supine to Sit     Supine to sit: Min assist, HOB elevated, Used rails     General bed mobility comments: assist for BLE Patient Response: Cooperative  Transfers Overall transfer level: Needs assistance Equipment used: Rolling walker (2 wheels) Transfers: Sit to/from Stand, Bed to chair/wheelchair/BSC Sit to Stand: Contact guard assist           General transfer comment: VC's for hand placement. No physical assist needed today with STS from ICU bed and also from recliner using arm rests.    Ambulation/Gait Ambulation/Gait assistance: Supervision Gait Distance (Feet): 6 Feet Assistive device: Rolling walker (2 wheels) Gait Pattern/deviations: Step-through pattern, Decreased step length - right, Decreased step length - left       General Gait Details: limited due to Lines/leads. Ambulated at EOB. Excellent stability noted using RW  Stairs            Wheelchair Mobility     Tilt Bed Tilt Bed Patient Response: Cooperative  Modified Rankin (Stroke Patients Only)       Balance Overall balance assessment: Needs assistance  Sitting-balance support: Feet supported Sitting balance-Leahy Scale: Good     Standing balance support: Bilateral upper extremity supported, During functional activity, Reliant on assistive device for balance Standing balance-Leahy Scale: Fair                                Pertinent Vitals/Pain Pain Assessment Pain Assessment: No/denies pain    Home Living Family/patient expects to be discharged to:: Private residence Living Arrangements: Spouse/significant other Available Help at Discharge: Family;Available 24 hours/day Type of Home: House Home Access: Stairs to enter   [employer] of Steps: 4   Home Layout: One level Home Equipment: Agricultural consultant (2 wheels);Cane - single point;Crutches;BSC/3in1;Shower seat;Transport chair Additional Comments: report difficulites with transport chair as pt is unable to push himself    Prior Function Prior Level of Function : Needs assist             Mobility Comments: amb with SPC, RW limited household distances; multiple fall history in the last six months, difficulties with the steps, doesn't leave the house much ADLs Comments: wife assists with dressing, bathing (specifically LB tasks), set up-MOD I for grooming/feeding tasks; assist for IADL from family     Extremity/Trunk Assessment   Upper Extremity Assessment Upper Extremity Assessment: Generalized weakness    Lower Extremity Assessment Lower Extremity Assessment: Generalized weakness       Communication   Communication Communication: No apparent difficulties    Cognition Arousal: Alert Behavior During Therapy: WFL for tasks assessed/performed   PT - Cognitive impairments: No apparent impairments                         Following commands: Intact       Cueing Cueing Techniques: Verbal cues     General Comments      Exercises     Assessment/Plan    PT Assessment Patient needs continued PT services  PT Problem List Decreased strength;Decreased mobility;Decreased activity tolerance;Decreased balance       PT Treatment Interventions DME instruction;Therapeutic exercise;Gait training;Balance training;Stair training;Neuromuscular re-education;Functional mobility  training;Therapeutic activities;Patient/family education    PT Goals (Current goals can be found in the Care Plan section)  Acute Rehab PT Goals Patient Stated Goal: to go home PT Goal Formulation: With patient Time For Goal Achievement: 02/10/24 Potential to Achieve Goals: Fair    Frequency Min 3X/week     Co-evaluation               AM-PAC PT "6 Clicks" Mobility  Outcome Measure Help needed turning from your back to your side while in a flat bed without using bedrails?: A Lot Help needed moving from lying on your back to sitting on the side of a flat bed without using bedrails?: A Lot Help needed moving to and from a bed to a chair (including a wheelchair)?: A Little Help needed standing up from a chair using your arms (e.g., wheelchair or bedside chair)?: A Little Help needed to walk in hospital room?: A Little Help needed climbing 3-5 steps with a railing? : A Lot 6 Click Score: 15    End of Session Equipment Utilized During Treatment: Gait belt Activity Tolerance: Patient tolerated treatment well Patient left: in chair;with call bell/phone within reach;with nursing/sitter in room Nurse Communication: Mobility status PT Visit Diagnosis: Other abnormalities of gait and mobility (R26.89);Muscle weakness (generalized) (M62.81);Difficulty in walking, not elsewhere classified (R26.2);History of falling (Z91.81)  Time: 1610-9604 PT Time Calculation (min) (ACUTE ONLY): 25 min   Charges:   PT Evaluation $PT Eval Moderate Complexity: 1 Mod PT Treatments $Therapeutic Activity: 8-22 mins PT General Charges $$ ACUTE PT VISIT: 1 Visit        Marc Senior. Fairly IV, PT, DPT Physical Therapist- [place]  [facility]  01/27/2024, 11:32 AM

## 2024-01-28 ENCOUNTER — Encounter: Payer: Self-pay | Admitting: Internal Medicine

## 2024-01-28 DIAGNOSIS — I959 Hypotension, unspecified: Secondary | ICD-10-CM | POA: Diagnosis not present

## 2024-01-28 LAB — CBC
HCT: 32.6 % — ABNORMAL LOW (ref 39.0–52.0)
Hemoglobin: 10.5 g/dL — ABNORMAL LOW (ref 13.0–17.0)
MCH: 29.5 pg (ref 26.0–34.0)
MCHC: 32.2 g/dL (ref 30.0–36.0)
MCV: 91.6 fL (ref 80.0–100.0)
Platelets: 202 10*3/uL (ref 150–400)
RBC: 3.56 MIL/uL — ABNORMAL LOW (ref 4.22–5.81)
RDW: 14 % (ref 11.5–15.5)
WBC: 9.1 10*3/uL (ref 4.0–10.5)
nRBC: 0 % (ref 0.0–0.2)

## 2024-01-28 LAB — TYPE AND SCREEN
ABO/RH(D): O POS
Antibody Screen: NEGATIVE
Unit division: 0
Unit division: 0
Unit division: 0
Unit division: 0

## 2024-01-28 LAB — BPAM RBC
Blood Product Expiration Date: 202506072359
Blood Product Expiration Date: 202506072359
Blood Product Expiration Date: 202506072359
Blood Product Expiration Date: 202506072359
ISSUE DATE / TIME: 202505070558
Unit Type and Rh: 5100
Unit Type and Rh: 5100
Unit Type and Rh: 5100
Unit Type and Rh: 5100

## 2024-01-28 LAB — BASIC METABOLIC PANEL WITH GFR
Anion gap: 7 (ref 5–15)
BUN: 11 mg/dL (ref 8–23)
CO2: 24 mmol/L (ref 22–32)
Calcium: 7.6 mg/dL — ABNORMAL LOW (ref 8.9–10.3)
Chloride: 109 mmol/L (ref 98–111)
Creatinine, Ser: 1.05 mg/dL (ref 0.61–1.24)
GFR, Estimated: >60 mL/min (ref >60)
Glucose, Bld: 123 mg/dL — ABNORMAL HIGH (ref 70–99)
Potassium: 4.5 mmol/L (ref 3.5–5.1)
Sodium: 140 mmol/L (ref 135–145)

## 2024-01-28 LAB — PREPARE RBC (CROSSMATCH)

## 2024-01-28 LAB — CYTOLOGY - NON PAP

## 2024-01-28 LAB — SURGICAL PATHOLOGY

## 2024-01-28 LAB — ABO/RH: ABO/RH(D): O POS

## 2024-01-28 LAB — MAGNESIUM: Magnesium: 2 mg/dL (ref 1.7–2.4)

## 2024-01-28 LAB — PHOSPHORUS: Phosphorus: 2.9 mg/dL (ref 2.5–4.6)

## 2024-01-28 MED ORDER — FUROSEMIDE 20 MG PO TABS: 20.0000 mg | ORAL_TABLET | Freq: Every day | ORAL | 1 refills | Status: DC

## 2024-01-28 MED ORDER — MIDODRINE HCL 5 MG PO TABS: 5.0000 mg | ORAL_TABLET | Freq: Three times a day (TID) | ORAL | 1 refills | Status: DC | PRN

## 2024-01-28 MED ORDER — PANTOPRAZOLE SODIUM 40 MG PO TBEC: 40.0000 mg | DELAYED_RELEASE_TABLET | Freq: Every day | ORAL | 1 refills | Status: DC

## 2024-01-28 MED ORDER — VITAMIN D 25 MCG (1000 UNIT) PO TABS
1000.0000 [IU] | ORAL_TABLET | Freq: Every day | ORAL | Status: DC
  Administered 2024-01-28: 1000 [IU] via ORAL
  Filled 2024-01-28: qty 1

## 2024-01-28 MED ORDER — PANTOPRAZOLE SODIUM 40 MG PO TBEC: 40.0000 mg | DELAYED_RELEASE_TABLET | Freq: Two times a day (BID) | ORAL | 1 refills | Status: DC

## 2024-01-28 NOTE — Progress Notes (Signed)
 [facility] Liaison Note  Received request from [facility], [name], RN , Transitions of Care Manager, for hospice services at home after discharge.  Spoke with patient and wife to initiate education related to hospice philosophy, services, and team approach to care.  Patient and wife verbalized understanding of information given.  Per discussion, the plan is for discharge home today via wife's care  DME needs discussed.  Patient has the following equipment in the home: walker, BSC shower bench and recliner.   Patient/family requests the following equipment in the home  Light weight transport WC.   The address has been verified and is correct in the chart.  Karl Norman and phone number [phone]  is the family contact to arrange time of equipment delivery.    Please send signed and completed DNR home with the patient/family.  Please provide prescriptions at discharge as needed to ensure ongoing symptom management.   [facility] information and contact numbers given to wife Above information shared with [name], RNTransitions of Care Manager.     Please call with any Hospice related questions or concerns.  Thank you for the opportunity to participate in this patient's care.  Helmut Lobe, [facility] Liaison [phone]

## 2024-01-28 NOTE — TOC Transition Note (Signed)
 Transition of Care [facility]) - Discharge Note   Patient Details  Name: Karl Norman MRN: [id number] Date of Birth: Mar 28, 1946  Transition of Care [facility]) CM/SW Contact:  [name], RN Phone Number: 01/28/2024, 3:16 PM   Clinical Narrative:    Per chart review pt with active dc order. This CM rec'd message to coordinate [facility] hospice for dc. This CM spoke with pt's wife Mrs. Siefken and confirmed dc plan for pt to have [facility] coordinated , and confirmed pt had no preference. This CM coordinated HH hospice with [facility]. Medical Team aware no additional dc needs requested from TOC/ this CM at this time.    Final next level of care: Home w Hospice Care Barriers to Discharge: No Barriers Identified   Patient Goals and CMS Choice            Discharge Placement                       Discharge Plan and Services Additional resources added to the After Visit Summary for                              [facility] Agency:  [facility])        Social Drivers of Health (SDOH) Interventions SDOH Screenings   Food Insecurity: No Food Insecurity (01/24/2024)  Housing: Low Risk  (01/24/2024)  Transportation Needs: No Transportation Needs (01/24/2024)  Utilities: Not At Risk (01/24/2024)  Social Connections: Moderately Isolated (01/24/2024)  Tobacco Use: Medium Risk (01/27/2024)     Readmission Risk Interventions     No data to display

## 2024-01-28 NOTE — Progress Notes (Signed)
 Occupational Therapy Treatment Patient Details Name: Karl Norman MRN: [id number] DOB: 02-29-1946 Today's Date: 01/28/2024   History of present illness Pt is a 78 year old male presented to ED due to [facility]; work up includes hemoptysis, hypotension, acute hypoxic respiratory failure, a fib    PMH significant for hypertension, chronic systolic heart failure with recovered EF, paroxysmal atrial fibrillation, chronic hepatitis C, chronic pancreatitis, COPD, DVT, choledocholithiasis   OT comments  Upon entering the room, pt supine in bed and agreeable to OT intervention. Pt wanting to get out of bed and is motivated for therapy. Pt stands with CGA and ambulates 15' in room with RW on RA. O2 decreases to 85% after sitting in recliner chair and needing to be placed on 1L to return to 90% or higher with focus on pursed lip breathing. Pt demonstrates ability to don and doff B socks without assistance.       If plan is discharge home, recommend the following:  A little help with walking and/or transfers;A little help with bathing/dressing/bathroom;Assistance with cooking/housework;Help with stairs or ramp for entrance         Precautions / Restrictions Precautions Precautions: Fall Recall of Precautions/Restrictions: Intact       Mobility Bed Mobility Overal bed mobility: Needs Assistance Bed Mobility: Supine to Sit     Supine to sit: Min assist          Transfers Overall transfer level: Needs assistance Equipment used: Rolling walker (2 wheels) Transfers: Sit to/from Stand, Bed to chair/wheelchair/BSC Sit to Stand: Contact guard assist                 Balance Overall balance assessment: Needs assistance Sitting-balance support: Feet supported Sitting balance-Leahy Scale: Good     Standing balance support: Bilateral upper extremity supported, During functional activity, Reliant on assistive device for balance Standing balance-Leahy Scale: Fair                              ADL either performed or assessed with clinical judgement   ADL Overall ADL's : Needs assistance/impaired                     Lower Body Dressing: Supervision/safety Lower Body Dressing Details (indicate cue type and reason): socks Toilet Transfer: Contact guard assist                  Extremity/Trunk Assessment Upper Extremity Assessment Upper Extremity Assessment: Generalized weakness            Vision Patient Visual Report: No change from baseline           Communication Communication Communication: No apparent difficulties   Cognition Arousal: Alert Behavior During Therapy: WFL for tasks assessed/performed Cognition: No apparent impairments                               Following commands: Intact        Cueing   Cueing Techniques: Verbal cues             Pertinent Vitals/ Pain       Pain Assessment Pain Assessment: No/denies pain         Frequency  Min 2X/week         AM-PAC OT "6 Clicks" Daily Activity     Outcome Measure   Help from another person eating meals?: None Help from  another person taking care of personal grooming?: None Help from another person toileting, which includes using toliet, bedpan, or urinal?: A Lot Help from another person bathing (including washing, rinsing, drying)?: A Lot Help from another person to put on and taking off regular upper body clothing?: A Little Help from another person to put on and taking off regular lower body clothing?: A Lot 6 Click Score: 17    End of Session Equipment Utilized During Treatment: Rolling walker (2 wheels);Oxygen  OT Visit Diagnosis: Other abnormalities of gait and mobility (R26.89);History of falling (Z91.81);Unsteadiness on feet (R26.81)   Activity Tolerance Patient tolerated treatment well   Patient Left with call bell/phone within reach;in chair   Nurse Communication Mobility status        Time: 1023-1040 OT Time Calculation  (min): 17 min  Charges: OT General Charges $OT Visit: 1 Visit OT Treatments $Therapeutic Activity: 8-22 mins  George Kinder, MS, OTR/L , CBIS ascom [phone]  01/28/24, 1:17 PM

## 2024-01-28 NOTE — Discharge Summary (Signed)
 Karl Norman GEX:[id number] DOB: March 24, 1946 DOA: 01/24/2024  PCP: Yehuda Helms, MD  Admit date: 01/24/2024 Discharge date: 01/28/2024  Time spent: 35 minutes  Recommendations for Outpatient Follow-up:  Pcp f/u 1-2 weeks, check kidney function and electrolytes then GI f/u (Dr. Corky Diener, need to call and schedule)     Discharge Diagnoses:  Principal Problem:   Hypotension Active Problems:   Anasarca   Acute hypoxic respiratory failure (HCC)   Generalized weakness   Chronic systolic CHF (congestive heart failure), NYHA class 2 (HCC)   Paroxysmal A-fib (HCC)   Benign essential HTN   Near syncope   Malnutrition of moderate degree   Pressure injury of skin   Chronic pancreatitis (HCC)   Hemoptysis   Discharge Condition: stable  Diet recommendation: regular  Filed Weights   01/26/24 0200 01/27/24 0500 01/28/24 0500  Weight: 84.4 kg 76.3 kg 65.4 kg    History of present illness:  From admission h and p Karl Norman is a 78 y.o. male with medical history significant of hypertension, chronic systolic heart failure with recovered EF, paroxysmal atrial fibrillation, chronic hepatitis C, chronic pancreatitis, COPD, DVT, choledocholithiasis, who presents to the ED due to ground-level fall.   Karl Norman states that over the last few days, he has been noticing shortness of breath when standing with some dizziness.  He states that overall, this is not unusual for him as he has experienced this in the past.  Earlier today, he stood up from the commode and took 1 step and suddenly fell.  He denies loss of consciousness.  He states that it happened so quickly, he is not sure if he had dizziness or not.  At this time, he denies any chest pain, palpitations or shortness of breath.  He denies any recent illnesses.  He endorses severe lower extremity swelling for which he takes Lasix  as needed.  He states that sometimes it helps, other times it does not.  He has noticed some swelling of his abdomen but  denies distention.   Karl Norman states that since his multiple complications after cholecystectomy last year, he has been essentially sedentary.  He states that he lives between his recliner and the bathroom.  His appetite is unchanged.  He feels that his severe lower extremity swelling has contributed to his sedentary lifestyle.  He has not been taking Eliquis  since his discharge in December 2024.    Hospital Course:   # Hemoptysis # Esophagitis # Gastritis # Duodenal ulcer Overnight 5/6 hemoptysis in setting of resumption of apixaban .  Received kcentra  and 1 unit PRBCs. No further bleeding. Egd today showed esophagitis, gastritis, and non-bleeding duodenal ulcer - GI advises pantop 40 bid for one month then daily - treated with ceftriaxone  but as GI does not think the patient has cirrhosis will hold on further abx - outpatient gi f/u, consider repeat EGD to monitor healing   # Ascites # Anasarca # Malnutrition With significant hypoalbuminemia, hx hcv but no prior dx of cirrhosis. Hx chronic pancreatitis, malabsorption may be an issue but no diarrhea. Normal platelets, no signs cirrhosis on imaging. GI think malnutrition most likely etiology. Ascitic fluid not consistent w/ sbp - f/u ascitic fluid culture and cytology - started on low dose lasix , will need outpatient monitoring - RD provided nutrition counseling - outpatient GI f/u   # Hypotension Likely mutifactorial - hold BP meds at discharge - daughter requests midodrine  for prn use   # HFrecoveredEF # Mitral regurg, moderate # Tricuspid  regurg, moderate to severe TTE unremarkable here - trial of lasix  as above   # Acute hypoxic respiratory failure Stable on 2 liters, denies respiratory symptoms. Hemoptysis overnight, "frothy" debris seen on CT of chest. May have aspirated but if so asymptomatic. CTA neg for PE. Now weaned off oxygen  # Chronic pancreatitis # HCV - gi has ordered hcv rna, pending at time of d/c   #  A-fib Declines anticoagulation - holding anticoagulationi - rate controlled off meds   # Decline in functional status Overall prognosis appears poor, has had significant decline in functional status last 6 months - palliative consulted. Patient open to outpatient hospice, TOC aware and will facilitate   # Debility - PT advising skilled nursing, patient declines, also declines home health    Procedures: EGD   Consultations: GI  Discharge Exam: Vitals:   01/28/24 0600 01/28/24 0700  BP: 105/73 114/62  Pulse:    Resp: 12 11  Temp:    SpO2:      Constitutional: In no distress. Chronically ill appearing Cardiovascular: Normal rate, regular rhythm.    Pulmonary: normal wob, rales at bases, otherwise clear Abdominal: Soft. Nontender. distended Musculoskeletal: edema to knees, improved Neurological: Alert and oriented to person, place, and time. Non focal  Skin: Skin is warm and dry. Has ecchymosis on BLE and some abrasions.   Discharge Instructions   Discharge Instructions     Diet - low sodium heart healthy   Complete by: As directed    Increase activity slowly   Complete by: As directed    No wound care   Complete by: As directed       Allergies as of 01/28/2024       Reactions   Penicillins    Has patient had a PCN reaction causing immediate rash, facial/tongue/throat swelling, SOB or lightheadedness with hypotension: NO Has patient had a PCN reaction causing severe rash involving mucus membranes or skin necrosis: YES Has patient had a PCN reaction that required hospitalization: YES Has patient had a PCN reaction occurring within the last 10 years: NO If all of the above answers are "NO", then may proceed with Cephalosporin use.        Medication List     STOP taking these medications    apixaban  5 MG Tabs tablet Commonly known as: ELIQUIS    ibuprofen 200 MG tablet Commonly known as: ADVIL   metoprolol  tartrate 25 MG tablet Commonly known as:  LOPRESSOR    omeprazole 20 MG capsule Commonly known as: PRILOSEC   sulfamethoxazole -trimethoprim  800-160 MG tablet Commonly known as: BACTRIM  DS   valsartan  80 MG tablet Commonly known as: DIOVAN        TAKE these medications    b complex vitamins capsule Take 1 capsule by mouth daily.   chlorhexidine  4 % external liquid Commonly known as: Hibiclens  Apply topically daily as needed (wound cleansing).   cyanocobalamin  1000 MCG tablet Commonly known as: VITAMIN B12 Take 1,000 mcg by mouth daily.   ferrous sulfate 324 MG Tbec Take 324 mg by mouth daily as needed.   furosemide  20 MG tablet Commonly known as: LASIX  Take 1 tablet (20 mg total) by mouth daily. Start taking on: Jan 29, 2024 What changed:  medication strength how much to take how to take this when to take this additional instructions   loperamide  2 MG capsule Commonly known as: IMODIUM  Take 4 mg by mouth 2 (two) times daily.   ondansetron  4 MG tablet Commonly known as: ZOFRAN  Take  4 mg by mouth every 8 (eight) hours as needed for nausea.   pantoprazole  40 MG tablet Commonly known as: Protonix  Take 1 tablet (40 mg total) by mouth daily.   simethicone  80 MG chewable tablet Commonly known as: MYLICON Chew 80 mg by mouth every 6 (six) hours as needed for flatulence.       Allergies  Allergen Reactions   Penicillins     Has patient had a PCN reaction causing immediate rash, facial/tongue/throat swelling, SOB or lightheadedness with hypotension: NO Has patient had a PCN reaction causing severe rash involving mucus membranes or skin necrosis: YES Has patient had a PCN reaction that required hospitalization: YES Has patient had a PCN reaction occurring within the last 10 years: NO If all of the above answers are "NO", then may proceed with Cephalosporin use.     Follow-up Information     Sparks, Rosalynn Come, MD Follow up.   Specialty: Internal Medicine Contact information: [address] [place] [address] [phone]         Cassie Click, MD Follow up.   Specialty: Gastroenterology Contact information: [address] [place] [address] [phone]                  The results of significant diagnostics from this hospitalization (including imaging, microbiology, ancillary and laboratory) are listed below for reference.    Significant Diagnostic Studies: US  Paracentesis Result Date: 01/26/2024 INDICATION: 78 year old male with history of HCV without cirrhosis, chronic pancreatitis, with new onset ascites. IR was requested for diagnostic and therapeutic paracentesis. EXAM: ULTRASOUND GUIDED DIAGNOSTIC AND THERAPEUTIC PARACENTESIS MEDICATIONS: 6 cc of 1% lidocaine  COMPLICATIONS: None immediate. PROCEDURE: Informed written consent was obtained from the patient after a discussion of the risks, benefits and alternatives to treatment. A timeout was performed prior to the initiation of the procedure. Initial ultrasound scanning demonstrates a large amount of ascites within the right lower abdominal quadrant. The right lower abdomen was prepped and draped in the usual sterile fashion. 1% lidocaine  was used for local anesthesia. Following this, a 19 gauge, 7-cm, Yueh catheter was introduced. An ultrasound image was saved for documentation purposes. The paracentesis was performed. The catheter was removed and a dressing was applied. The patient tolerated the procedure well without immediate post procedural complication. FINDINGS: A total of approximately 2.85 L of clear, straw-colored peritoneal fluid was removed. Samples were sent to the laboratory as requested by the clinical team. IMPRESSION: Successful ultrasound-guided paracentesis yielding 2.85 liters of peritoneal fluid. Procedure performed by Lambert Pillion, PA-C Electronically Signed   By: Fernando Hoyer M.D.   On: 01/26/2024 16:46   ECHOCARDIOGRAM LIMITED Result Date:  01/26/2024    ECHOCARDIOGRAM LIMITED REPORT   Patient Name:   Karl Norman Date of Exam: 01/25/2024 Medical Rec #:  [id number]    Height:       68.0 in Accession #:    [id number]   Weight:       191.5 lb Date of Birth:  08/24/1946   BSA:          2.006 m Patient Age:    77 years     BP:           107/52 mmHg Patient Gender: M            HR:           78 bpm. Exam Location:  [facility] Procedure: Limited Echo and Limited Color Doppler (Both Spectral and  Color Flow            Doppler were utilized during procedure). Indications:     CHF I50.9  History:         Patient has prior history of Echocardiogram examinations, most                  recent 09/19/2023. CHF, Arrythmias:Atrial Fibrillation,                  Signs/Symptoms:Hypotension; Risk Factors:Hypertension.  Sonographer:     Terrilee Few RCS Referring Phys:  [id number] Avi Body Diagnosing Phys: Joetta Mustache IMPRESSIONS  1. Left ventricular ejection fraction, by estimation, is 60 to 65%. The left ventricle has normal function. The left ventricle has no regional wall motion abnormalities. There is mild left ventricular hypertrophy. Left ventricular diastolic parameters were normal.  2. Right ventricular systolic function is normal. The right ventricular size is normal.  3. Mild mitral valve regurgitation.  4. The aortic valve is tricuspid. Aortic valve regurgitation is mild. Aortic valve sclerosis/calcification is present, without any evidence of aortic stenosis. FINDINGS  Left Ventricle: Left ventricular ejection fraction, by estimation, is 60 to 65%. The left ventricle has normal function. The left ventricle has no regional wall motion abnormalities. There is mild left ventricular hypertrophy. Left ventricular diastolic  parameters were normal. Right Ventricle: The right ventricular size is normal. No increase in right ventricular wall thickness. Right ventricular systolic function is normal. Left Atrium: Left atrial size was normal in size. Right Atrium: Right  atrial size was normal in size. Pericardium: There is no evidence of pericardial effusion. Mitral Valve: There is mild thickening of the mitral valve leaflet(s). Mild mitral annular calcification. Mild mitral valve regurgitation. Tricuspid Valve: The tricuspid valve is normal in structure. Tricuspid valve regurgitation is trivial. Aortic Valve: The aortic valve is tricuspid. Aortic valve regurgitation is mild. Aortic valve sclerosis/calcification is present, without any evidence of aortic stenosis. Aortic valve peak gradient measures 7.8 mmHg. Pulmonic Valve: The pulmonic valve was not well visualized. Pulmonic valve regurgitation is not visualized. Aorta: The aortic root is normal in size and structure. Additional Comments: There is pleural effusion in the left lateral region.  LEFT VENTRICLE PLAX 2D LVIDd:         3.90 cm   Diastology LVIDs:         2.80 cm   LV e' lateral:   16.00 cm/s LV PW:         1.20 cm   LV E/e' lateral: 4.9 LV IVS:        1.10 cm LVOT diam:     2.10 cm LV SV:         55 LV SV Index:   28 LVOT Area:     3.46 cm  RIGHT VENTRICLE RV S prime:     8.21 cm/s TAPSE (M-mode): 1.6 cm LEFT ATRIUM         Index LA diam:    3.80 cm 1.89 cm/m  AORTIC VALVE AV Area (Vmax): 2.18 cm AV Vmax:        140.00 cm/s AV Peak Grad:   7.8 mmHg LVOT Vmax:      88.00 cm/s LVOT Vmean:     64.500 cm/s LVOT VTI:       0.160 m  AORTA Ao Root diam: 3.60 cm Ao Asc diam:  3.90 cm MITRAL VALVE               TRICUSPID VALVE MV Area (PHT):  5.02 cm    TR Peak grad:   17.1 mmHg MV Decel Time: 151 msec    TR Vmax:        207.00 cm/s MR Peak grad: 95.6 mmHg MR Vmax:      489.00 cm/s  SHUNTS MV E velocity: 78.60 cm/s  Systemic VTI:  0.16 m MV A velocity: 80.70 cm/s  Systemic Diam: 2.10 cm MV E/A ratio:  0.97 Joetta Mustache Electronically signed by Joetta Mustache Signature Date/Time: 01/26/2024/1:09:20 PM    Final    CT ANGIO GI BLEED Result Date: 01/26/2024 CLINICAL DATA:  Evaluate for acute lower GI bleed. EXAM: CTA ABDOMEN  AND PELVIS WITHOUT AND WITH CONTRAST TECHNIQUE: Multidetector CT imaging of the abdomen and pelvis was performed using the standard protocol during bolus administration of intravenous contrast. Multiplanar reconstructed images and MIPs were obtained and reviewed to evaluate the vascular anatomy. RADIATION DOSE REDUCTION: This exam was performed according to the departmental dose-optimization program which includes automated exposure control, adjustment of the mA and/or kV according to patient size and/or use of iterative reconstruction technique. CONTRAST:  OMNIPAQUE  IOHEXOL  350 MG/ML SOLN COMPARISON:  CT chest, abdomen and pelvis from 09/07/2023 FINDINGS: VASCULAR Aorta: Normal caliber aorta without aneurysm, dissection, vasculitis or significant stenosis. Aortic atherosclerotic calcifications identified. Celiac: Patent without evidence of aneurysm, dissection, vasculitis or significant stenosis. SMA: Patent without evidence of aneurysm, dissection, vasculitis or significant stenosis. Renals: Both renal arteries are patent without evidence of aneurysm, dissection, vasculitis, or fibromuscular dysplasia. Calcified plaque at the origin of bilateral renal arteries results in moderate stenosis at the origin of bilateral renal arteries. IMA: Patent without evidence of aneurysm, dissection, vasculitis or significant stenosis. Inflow: Patent without evidence of aneurysm, dissection, or vasculitis. No severe scratch set no significant stenosis noted. Proximal Outflow: Bilateral common femoral and visualized portions of the superficial and profunda femoral arteries are patent without evidence of aneurysm, dissection, vasculitis or significant stenosis. Veins: No obvious venous abnormality within the limitations of this arterial phase study. Review of the MIP images confirms the above findings. NON-VASCULAR Lower chest: Small bilateral pleural effusions identified. No consolidative change. Hepatobiliary: There is  marked diffuse hepatic steatosis. No focal enhancing liver lesions. Status post cholecystectomy. Pneumobilia identified compatible with biliary patency. Pancreas: There is pancreatic parenchymal atrophy. Main duct dilatation is identified up to the head of pancreas where there is a intraductal calcification measuring 0.9 cm. Additional calcifications noted within the head of pancreas in keeping with chronic pancreatitis. Spleen: Normal in size without focal abnormality. Adrenals/Urinary Tract: Right adrenal adenoma measures 1.6 cm, image 26/13. No follow-up imaging recommended. Slightly delayed cortical enhancement of the left kidney. No mass or obstructive uropathy. Urinary bladder is unremarkable. Stomach/Bowel: There is no pathologic dilatation of the large or small bowel loops. There is wall thickening involving the decompressed ascending colon and transverse colon, which may reflect incomplete distension versus inflammatory or infectious colitis. No signs of intraluminal contrast extravasation to suggest active GI bleed. The appendix is visualized and appears normal. Lymphatic: No abdominopelvic adenopathy. Reproductive: Prostate is unremarkable. Other: Moderate volume of abdominopelvic ascites is identified which appears new from the previous exam. No loculated fluid collections identified. No signs of pneumoperitoneum. Musculoskeletal: There is marked diffuse body wall edema. Marked multilevel stepwise retrolisthesis of L2 on L3 and L3 on L4 noted. Bilateral lumbar facet arthropathy. Remote healed fracture deformities involving the left superior and inferior pubic rami. Lumbar spondylosis identified. IMPRESSION: 1. No signs of intraluminal contrast extravasation to suggest active GI  bleed. 2. Wall thickening involving the decompressed ascending colon and transverse colon, which may reflect incomplete distension versus inflammatory or infectious colitis. Clinical correlation advised. 3. Moderate volume of  abdominopelvic ascites is identified which appears new from the previous exam.Marked diffuse body wall edema 4. Marked diffuse hepatic steatosis. 5. Small bilateral pleural effusions. 6.  Aortic Atherosclerosis (ICD10-I70.0). Electronically Signed   By: Kimberley Penman M.D.   On: 01/26/2024 05:06   CT ANGIO CHEST AORTA W/CM &/OR WO/CM Result Date: 01/26/2024 CLINICAL DATA:  Thoracic aortic dissection EXAM: CT ANGIOGRAPHY CHEST WITH CONTRAST TECHNIQUE: Multidetector CT imaging of the chest was performed using the standard protocol during bolus administration of intravenous contrast. Multiplanar CT image reconstructions and MIPs were obtained to evaluate the vascular anatomy. RADIATION DOSE REDUCTION: This exam was performed according to the departmental dose-optimization program which includes automated exposure control, adjustment of the mA and/or kV according to patient size and/or use of iterative reconstruction technique. CONTRAST:  OMNIPAQUE  IOHEXOL  350 MG/ML SOLN COMPARISON:  Chest CTA from 2 days prior FINDINGS: Cardiovascular: Preferential opacification of the thoracic aorta. No evidence of thoracic aortic aneurysm or dissection. Normal heart size. No pericardial effusion. Extensive atheromatous calcification of the aorta and coronaries. No pulmonary artery filling defect. Mediastinum/Nodes: Diffusely thickened esophagus, also seen on prior, small hiatal hernia. Lungs/Pleura: Small bilateral pleural effusion with some loculation in the major fissures. Frothy airway debris especially in the left lower lobe bronchi. Centrilobular emphysema and generalized airway thickening. Upper Abdomen: Reference dedicated CT. Musculoskeletal: Generalized thoracic spine degeneration. No acute or aggressive finding. Review of the MIP images confirms the above findings. IMPRESSION: Negative for acute aortic syndrome in the chest. Negative for pulmonary embolism. Frothy airway debris in the left lower lobe but only mild  atelectasis. Small pleural effusions with little change since study 2 days ago. Extensive atherosclerosis with emphysema. Electronically Signed   By: Ronnette Coke M.D.   On: 01/26/2024 05:03   CT Angio Chest Pulmonary Embolism (PE) W or WO Contrast Result Date: 01/25/2024 CLINICAL DATA:  Pulmonary embolism (PE) suspected, low to intermediate prob, positive D-dimer. Cough. EXAM: CT ANGIOGRAPHY CHEST WITH CONTRAST TECHNIQUE: Multidetector CT imaging of the chest was performed using the standard protocol during bolus administration of intravenous contrast. Multiplanar CT image reconstructions and MIPs were obtained to evaluate the vascular anatomy. RADIATION DOSE REDUCTION: This exam was performed according to the departmental dose-optimization program which includes automated exposure control, adjustment of the mA and/or kV according to patient size and/or use of iterative reconstruction technique. CONTRAST:  30mL OMNIPAQUE  IOHEXOL  350 MG/ML SOLN, 75mL OMNIPAQUE  IOHEXOL  350 MG/ML SOLN COMPARISON:  09/17/2023.  Chest x-ray today. FINDINGS: Cardiovascular: No filling defects in the pulmonary arteries to suggest pulmonary emboli. Heart is normal size. Aorta is normal caliber. Diffuse 3 vessel coronary artery disease. Moderate aortic atherosclerosis. Mediastinum/Nodes: No mediastinal, hilar, or axillary adenopathy. Trachea and esophagus are unremarkable. Thyroid  unremarkable. Lungs/Pleura: Trace bilateral pleural effusions. Fluid noted in the left major fissure. Mild centrilobular emphysema. Minimal dependent bibasilar atelectasis. Upper Abdomen: Pneumobilia.  Moderate ascites in the upper abdomen. Musculoskeletal: Anasarca like edema noted within the lower chest and upper abdominal wall. No acute bony abnormality. Review of the MIP images confirms the above findings. IMPRESSION: No evidence of pulmonary embolus. Three-vessel coronary artery disease. Trace bilateral pleural effusions including fluid in the left major  fissure. Upper abdominal ascites. Anasarca like edema in the chest and abdominal wall. Aortic Atherosclerosis (ICD10-I70.0) and Emphysema (ICD10-J43.9). Electronically Signed   By: Ernestina Headland  [place] M.D.   On: 01/25/2024 00:12   US  ASCITES (ABDOMEN LIMITED) Result Date: 01/24/2024 CLINICAL DATA:  Anasarca EXAM: LIMITED ABDOMEN ULTRASOUND FOR ASCITES TECHNIQUE: Limited ultrasound survey for ascites was performed in all four abdominal quadrants. COMPARISON:  CT scan 09/17/2023 FINDINGS: There is a moderate amount of ascites scattered in all 4 quadrants of the abdomen. IMPRESSION: 1. Moderate volume of ascites scattered in all 4 quadrants of the abdomen. Electronically Signed   By: Freida Jes M.D.   On: 01/24/2024 17:51   DG Chest Portable 1 View Result Date: 01/24/2024 CLINICAL DATA:  Fall, cough. History of congestive heart failure. Weakness and dizziness. EXAM: PORTABLE CHEST 1 VIEW COMPARISON:  Chest CT 09/17/2023 and chest radiograph 10/21/2020. FINDINGS: Atherosclerotic calcification of the aortic arch. Old healed left rib fractures. Mild scarring at the left lung base. Heart size within normal limits. Emphysema noted. Mild central airway thickening which may reflect bronchitis or reactive airways disease. IMPRESSION: 1. Mild central airway thickening which may reflect bronchitis or reactive airways disease. 2. Aortic Atherosclerosis (ICD10-I70.0) and Emphysema (ICD10-J43.9). 3. Old healed left rib fractures. Electronically Signed   By: Freida Jes M.D.   On: 01/24/2024 14:01    Microbiology: Recent Results (from the past 240 hours)  MRSA Next Gen by PCR, Nasal     Status: None   Collection Time: 01/26/24  2:11 AM   Specimen: Nasal Mucosa; Nasal Swab  Result Value Ref Range Status   MRSA by PCR Next Gen NOT DETECTED NOT DETECTED Final    Comment: (NOTE) The GeneXpert MRSA Assay (FDA approved for NASAL specimens only), is one component of a comprehensive MRSA colonization  surveillance program. It is not intended to diagnose MRSA infection nor to guide or monitor treatment for MRSA infections. Test performance is not FDA approved in patients less than 51 years old. Performed at [facility], [address] [place], [place] [address]   Body fluid culture w Gram Stain     Status: None (Preliminary result)   Collection Time: 01/26/24  3:28 PM   Specimen: PATH Cytology Peritoneal fluid  Result Value Ref Range Status   Specimen Description   Final    PERITONEAL Performed at [facility], [address], [place], [place] [address]    Special Requests   Final    NONE Performed at [facility], [address] [place], [place] [address]    Gram Stain   Final    WBC PRESENT, PREDOMINANTLY PMN NO ORGANISMS SEEN CYTOSPIN SMEAR    Culture   Final    NO GROWTH 2 DAYS Performed at [facility], [address], [place], [place] [address]    Report Status PENDING  Incomplete     Labs: Basic Metabolic Panel: Recent Labs  Lab 01/24/24 1210 01/25/24 0621 01/26/24 0143 01/27/24 0432 01/28/24 0438  NA 138 137 140 140 140  K 4.4 4.1 4.8 4.3 4.5  CL 110 110 112* 110 109  CO2 21* 24 24 24 24   GLUCOSE 95 76 118* 112* 123*  BUN 8 9 10 10 11   CREATININE 1.22 1.24 1.18 1.10 1.05  CALCIUM  7.6* 7.6* 7.5* 7.6* 7.6*  MG 1.5* 1.5* 2.1 2.0 2.0  PHOS  --   --  3.5 3.5 2.9   Liver Function Tests: Recent Labs  Lab 01/24/24 1210 01/25/24 0621 01/26/24 0143  AST 28 27 33  ALT 14 12 15   ALKPHOS 96 73 81  BILITOT 0.6 0.7 0.6  PROT 5.2* 4.4* 4.7*  ALBUMIN  1.6* 1.5* 1.6*   No results for input(s): "LIPASE", "AMYLASE" in the last 168 hours. No results for input(s): "AMMONIA" in the last 168 hours. CBC: Recent Labs  Lab 01/24/24 1210 01/25/24 0621 01/26/24 0143 01/26/24 1056 01/27/24 0432 01/28/24 0438  WBC 10.4 7.3 9.7 10.2 9.8 9.1  NEUTROABS 8.4* 5.3  --   --   --   --   HGB 12.0* 9.8* 9.9* 11.1* 10.6* 10.5*  HCT  37.5* 29.8* 30.3* 33.4* 32.5* 32.6*  MCV 91.7 89.0 89.9 89.8 90.8 91.6  PLT 283 231 256 220 233 202   Cardiac Enzymes: Recent Labs  Lab 01/24/24 1210  CKTOTAL 67   BNP: BNP (last 3 results) Recent Labs    09/17/23 1137 01/24/24 1210  BNP 178.9* 76.2    ProBNP (last 3 results) No results for input(s): "PROBNP" in the last 8760 hours.  CBG: Recent Labs  Lab 01/25/24 0829 01/25/24 0930 01/26/24 0117 01/26/24 0159  GLUCAP 68* 79 103* 98       Signed:  Raymonde Calico MD.  [facility] 01/28/2024, 11:52 AM

## 2024-01-28 NOTE — Progress Notes (Signed)
 Nutrition Follow-up  DOCUMENTATION CODES:   Non-severe (moderate) malnutrition in context of chronic illness  INTERVENTION:   -Continue Ensure Enlive po TID, each supplement provides 350 kcal and 20 grams of protein.  -Continue Magic cup TID with meals, each supplement provides 290 kcal and 9 grams of protein  -Continue MVI with minerals daily -Continue 1 mg folic acid  daily -Continue 100 mg thiamine  daily -Downgrade diet to dysphagia 3 for ease of intake  NUTRITION DIAGNOSIS:   Moderate Malnutrition related to chronic illness as evidenced by mild fat depletion, mild muscle depletion.  Ongoing  GOAL:   Patient will meet greater than or equal to 90% of their needs  Progressing   MONITOR:   PO intake, Supplement acceptance, Labs, Weight trends, Skin, I & O's  REASON FOR ASSESSMENT:   Consult Assessment of nutrition requirement/status  ASSESSMENT:   78 y/o male with h/o PAF s/p cardioversion, CHF, hepatitis C, HTN, GERD, remote etoh abuse (quit 2006), hiatal hernia, chronic pancreatitis with pseudocyst, DVT, gallstones s/p laparoscopic cholecystectomy 03/2023, PVD, empyema s/p pericardial window (2012) and COPD who is admitted with hypotension, falls and anasarca.  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 Pt pleasant and in good spirits today and reports feeling better. Pt reports he is hopeful to go home today and is feeling better.   Pt shares that his appetite has improved since admission. Noted meal completions 20%. Observed breakfast tray- pt consumed 25% of omelette, 75% of pancakes, and a bag of potato chips today. Per pt, he  has a loose dental bridge and has a hard time chewing due to this. RD offered downgrading diet for ease of intake, to which pt is agreeable to. Pt likes his Ensure and reports he drinks them when he is given them. Discussed importance of good meal and supplement intake to promote healing.   Case discussed with GI, RN, and MD.   Palliative care consult pending for goals of care.   Medications reviewed and include vitamin C , folic acid , protonix , and thiamine .   Labs reviewed: CBGS: 98. Vitamin D  WDL.   Diet Order:   Diet Order             DIET DYS 3 Fluid consistency: Thin  Diet effective now                   EDUCATION NEEDS:   Education needs have been addressed  Skin:  Skin Assessment: Skin Integrity Issues: Skin Integrity Issues:: Stage I Stage I: coccyx  Last BM:  01/28/24 (type 6)  Height:   Ht Readings from Last 1 Encounters:  01/26/24 5\' 9"  (1.753 m)    Weight:   Wt Readings from Last 1 Encounters:  01/28/24 65.4 kg    Ideal Body Weight:  70 kg  BMI:  Body mass index is 21.29 kg/m.  Estimated Nutritional Needs:   Kcal:  2000-2300kcal/day  Protein:  100-115g/day  Fluid:  1.8-2.1L/day    Herschel Lords, RD, LDN, CDCES Registered Dietitian III Certified Diabetes Care and Education Specialist If unable to reach this RD, please use "RD Inpatient" group chat on secure  chat between hours of 8am-4 pm daily

## 2024-01-30 LAB — BODY FLUID CULTURE W GRAM STAIN: Culture: NO GROWTH

## 2024-01-31 LAB — HCV RNA BY PCR, QN RFX GENO
HCV RNA Qnt(log copy/mL): UNDETERMINED {Log_IU}/mL
HepC Qn: NOT DETECTED [IU]/mL

## 2024-03-21 DEATH — deceased
